# Patient Record
Sex: Female | Born: 1975
Health system: Southern US, Community
[De-identification: ages and names within clinical notes are randomized; demographics above are authoritative.]

## PROBLEM LIST (undated history)

## (undated) ENCOUNTER — Emergency Department (HOSPITAL_BASED_OUTPATIENT_CLINIC_OR_DEPARTMENT_OTHER): Admission: EM

## (undated) DIAGNOSIS — F5104 Psychophysiologic insomnia: Secondary | ICD-10-CM

## (undated) DIAGNOSIS — G4489 Other headache syndrome: Secondary | ICD-10-CM

## (undated) DIAGNOSIS — I10 Essential (primary) hypertension: Secondary | ICD-10-CM

## (undated) DIAGNOSIS — IMO0002 Reserved for concepts with insufficient information to code with codable children: Secondary | ICD-10-CM

## (undated) DIAGNOSIS — Z3041 Encounter for surveillance of contraceptive pills: Secondary | ICD-10-CM

## (undated) DIAGNOSIS — M47816 Spondylosis without myelopathy or radiculopathy, lumbar region: Secondary | ICD-10-CM

## (undated) DIAGNOSIS — A6000 Herpesviral infection of urogenital system, unspecified: Secondary | ICD-10-CM

## (undated) DIAGNOSIS — N926 Irregular menstruation, unspecified: Secondary | ICD-10-CM

## (undated) DIAGNOSIS — E669 Obesity, unspecified: Secondary | ICD-10-CM

## (undated) DIAGNOSIS — K219 Gastro-esophageal reflux disease without esophagitis: Secondary | ICD-10-CM

## (undated) DIAGNOSIS — M792 Neuralgia and neuritis, unspecified: Secondary | ICD-10-CM

## (undated) HISTORY — DX: Essential (primary) hypertension: I10

## (undated) HISTORY — DX: Encounter for surveillance of contraceptive pills: Z30.41

## (undated) HISTORY — DX: Gastro-esophageal reflux disease without esophagitis: K21.9

## (undated) HISTORY — DX: Neuralgia and neuritis, unspecified: M79.2

## (undated) HISTORY — DX: Spondylosis without myelopathy or radiculopathy, lumbar region: M47.816

## (undated) HISTORY — DX: Psychophysiologic insomnia: F51.04

## (undated) HISTORY — DX: Herpesviral infection of urogenital system, unspecified: A60.00

## (undated) HISTORY — DX: Irregular menstruation, unspecified: N92.6

## (undated) HISTORY — DX: Other headache syndrome: G44.89

## (undated) HISTORY — PX: REDUCTION MAMMAPLASTY: SUR839

## (undated) HISTORY — DX: Reserved for concepts with insufficient information to code with codable children: IMO0002

## (undated) HISTORY — DX: Obesity, unspecified: E66.9

## (undated) HISTORY — PX: BREAST REDUCTION SURGERY: SHX8

---

## 1998-05-11 ENCOUNTER — Emergency Department (HOSPITAL_COMMUNITY): Admission: EM | Admit: 1998-05-11 | Discharge: 1998-05-11 | Payer: Self-pay | Admitting: *Deleted

## 1999-09-29 ENCOUNTER — Emergency Department (HOSPITAL_COMMUNITY): Admission: EM | Admit: 1999-09-29 | Discharge: 1999-09-29 | Payer: Self-pay | Admitting: *Deleted

## 1999-10-02 ENCOUNTER — Emergency Department (HOSPITAL_COMMUNITY): Admission: EM | Admit: 1999-10-02 | Discharge: 1999-10-02 | Payer: Self-pay | Admitting: Emergency Medicine

## 1999-10-03 ENCOUNTER — Emergency Department (HOSPITAL_COMMUNITY): Admission: EM | Admit: 1999-10-03 | Discharge: 1999-10-03 | Payer: Self-pay | Admitting: Emergency Medicine

## 2000-01-05 ENCOUNTER — Other Ambulatory Visit: Admission: RE | Admit: 2000-01-05 | Discharge: 2000-01-05 | Payer: Self-pay | Admitting: Family Medicine

## 2000-10-11 ENCOUNTER — Emergency Department (HOSPITAL_COMMUNITY): Admission: EM | Admit: 2000-10-11 | Discharge: 2000-10-11 | Payer: Self-pay | Admitting: Emergency Medicine

## 2000-11-20 ENCOUNTER — Inpatient Hospital Stay (HOSPITAL_COMMUNITY): Admission: AD | Admit: 2000-11-20 | Discharge: 2000-11-20 | Payer: Self-pay | Admitting: *Deleted

## 2000-12-26 ENCOUNTER — Other Ambulatory Visit: Admission: RE | Admit: 2000-12-26 | Discharge: 2000-12-26 | Payer: Self-pay | Admitting: Obstetrics and Gynecology

## 2001-07-21 ENCOUNTER — Inpatient Hospital Stay (HOSPITAL_COMMUNITY): Admission: AD | Admit: 2001-07-21 | Discharge: 2001-07-21 | Payer: Self-pay | Admitting: *Deleted

## 2001-07-21 ENCOUNTER — Inpatient Hospital Stay (HOSPITAL_COMMUNITY): Admission: AD | Admit: 2001-07-21 | Discharge: 2001-07-25 | Payer: Self-pay | Admitting: Obstetrics and Gynecology

## 2002-12-31 ENCOUNTER — Other Ambulatory Visit: Admission: RE | Admit: 2002-12-31 | Discharge: 2002-12-31 | Payer: Self-pay | Admitting: Obstetrics and Gynecology

## 2003-02-21 HISTORY — PX: BREAST REDUCTION SURGERY: SHX8

## 2003-07-04 ENCOUNTER — Emergency Department (HOSPITAL_COMMUNITY): Admission: EM | Admit: 2003-07-04 | Discharge: 2003-07-04 | Payer: Self-pay | Admitting: Emergency Medicine

## 2003-08-11 ENCOUNTER — Emergency Department (HOSPITAL_COMMUNITY): Admission: EM | Admit: 2003-08-11 | Discharge: 2003-08-11 | Payer: Self-pay | Admitting: *Deleted

## 2003-10-06 ENCOUNTER — Emergency Department (HOSPITAL_COMMUNITY): Admission: EM | Admit: 2003-10-06 | Discharge: 2003-10-06 | Payer: Self-pay | Admitting: Emergency Medicine

## 2004-01-12 ENCOUNTER — Emergency Department (HOSPITAL_COMMUNITY): Admission: EM | Admit: 2004-01-12 | Discharge: 2004-01-12 | Payer: Self-pay | Admitting: Family Medicine

## 2004-05-12 ENCOUNTER — Ambulatory Visit: Payer: Self-pay | Admitting: Internal Medicine

## 2004-09-24 ENCOUNTER — Emergency Department (HOSPITAL_COMMUNITY): Admission: EM | Admit: 2004-09-24 | Discharge: 2004-09-24 | Payer: Self-pay | Admitting: Emergency Medicine

## 2006-12-12 ENCOUNTER — Encounter: Admission: RE | Admit: 2006-12-12 | Discharge: 2006-12-12 | Payer: Self-pay | Admitting: *Deleted

## 2007-05-14 ENCOUNTER — Emergency Department (HOSPITAL_COMMUNITY): Admission: EM | Admit: 2007-05-14 | Discharge: 2007-05-14 | Payer: Self-pay | Admitting: Emergency Medicine

## 2008-05-24 ENCOUNTER — Emergency Department (HOSPITAL_COMMUNITY): Admission: EM | Admit: 2008-05-24 | Discharge: 2008-05-24 | Payer: Self-pay | Admitting: Family Medicine

## 2009-07-24 ENCOUNTER — Emergency Department (HOSPITAL_COMMUNITY): Admission: EM | Admit: 2009-07-24 | Discharge: 2009-07-24 | Payer: Self-pay | Admitting: Family Medicine

## 2010-04-29 ENCOUNTER — Ambulatory Visit (INDEPENDENT_AMBULATORY_CARE_PROVIDER_SITE_OTHER): Payer: 59 | Admitting: Family Medicine

## 2010-04-29 DIAGNOSIS — Z0000 Encounter for general adult medical examination without abnormal findings: Secondary | ICD-10-CM

## 2010-05-16 ENCOUNTER — Inpatient Hospital Stay (INDEPENDENT_AMBULATORY_CARE_PROVIDER_SITE_OTHER)
Admission: RE | Admit: 2010-05-16 | Discharge: 2010-05-16 | Disposition: A | Discharge status: Home / Self Care | Payer: 59 | Source: Ambulatory Visit | Attending: Family Medicine | Admitting: Family Medicine

## 2010-05-16 DIAGNOSIS — H5710 Ocular pain, unspecified eye: Secondary | ICD-10-CM

## 2010-07-08 NOTE — Discharge Summary (Signed)
[facility]  Patient:    Amanda Cox, Amanda Cox Visit Number: [id number] MRN: [id number]          Service Type: OBS Location: 910A [id number] Attending Physician:  Miguel Aschoff Dictated by:   Leilani Able, P.A. Admit Date:  07/21/2001 Discharge Date: 07/25/2001                             Discharge Summary  FINAL DIAGNOSES:                  1. Intrauterine pregnancy at term.                                   2. Arrest disorder of dilation.                                   3. Intrapartum fever.  PROCEDURE:                        Primary low transverse cesarean section.  SURGEON:                          Gerrit Friends. Aldona Bar, M.D.  COMPLICATIONS:                    None.  HISTORY AND HOSPITAL COURSE:      This 35 year old G2, P0, presents on July 21, 2001 in early labor.  The patient was about 3 cm dilated, 90% effaced, and -2 station at this time with intact membranes.  When the patient was checked on the morning of July 22, 2001, she was about 5 cm dilated, 60% effaced, and still about -2 to -3 station.  The vertex did not fit the cervix well.  There was also a possibility of the estimated fetal weight being around 9 pounds and at this point amniotomy was carried with clear fluids and IUPCs were placed.  Pitocin was started for augmentation.  The patient did receive an epidural.  The patient had about 3 hours of good documented labor and her cervix remained unchanged.  The patient was also now starting an intrapartum fever of about 100.6 and at this point a decision was made to proceed with a cesarean section.  Secondary to arrest disorder of dilation and intrapartum fever, the patient was taken to the operating room on July 22, 2001 by Dr. Annamaria Helling where a primary low transverse cesarean section was performed with the delivery of a 7-pound 7-ounce female infant with Apgars of 8 and 9. The delivery went without complication.  The patients postoperative  course was benign without significant fevers.  The patient was felt ready for discharge on postoperative day #3.  She was sent home on a regular diet, told to decrease activities, told to continue prenatal vitamins and FeSO4, was given a prescription for Percocet one to two every 4 hours as needed for pain, told to follow up in the office in 4 weeks. Dictated by:   Leilani Able, P.A. Attending Physician:  Miguel Aschoff DD:  08/15/01 TD:  08/17/01 Job: [id number]

## 2010-08-03 ENCOUNTER — Other Ambulatory Visit: Payer: Self-pay | Admitting: Obstetrics

## 2010-08-03 DIAGNOSIS — N946 Dysmenorrhea, unspecified: Secondary | ICD-10-CM

## 2010-08-04 ENCOUNTER — Ambulatory Visit (HOSPITAL_COMMUNITY)
Admission: RE | Admit: 2010-08-04 | Discharge: 2010-08-04 | Disposition: A | Discharge status: Home / Self Care | Payer: 59 | Source: Ambulatory Visit | Attending: Obstetrics | Admitting: Obstetrics

## 2010-08-04 ENCOUNTER — Ambulatory Visit (HOSPITAL_COMMUNITY): Payer: 59

## 2010-08-04 DIAGNOSIS — N946 Dysmenorrhea, unspecified: Secondary | ICD-10-CM

## 2010-08-04 DIAGNOSIS — D251 Intramural leiomyoma of uterus: Secondary | ICD-10-CM | POA: Insufficient documentation

## 2010-08-04 DIAGNOSIS — IMO0002 Reserved for concepts with insufficient information to code with codable children: Secondary | ICD-10-CM | POA: Insufficient documentation

## 2010-09-07 ENCOUNTER — Encounter: Payer: Self-pay | Admitting: Family Medicine

## 2010-09-12 ENCOUNTER — Telehealth: Payer: Self-pay | Admitting: Family Medicine

## 2010-09-12 NOTE — Telephone Encounter (Signed)
All results normal per JCL.   If she still is having issues then come in for check up... Left message for pt-lm

## 2010-09-12 NOTE — Telephone Encounter (Signed)
I have call [facility] for results

## 2010-09-15 ENCOUNTER — Telehealth: Payer: Self-pay | Admitting: Family Medicine

## 2010-09-15 NOTE — Telephone Encounter (Signed)
Need inform pt her results were are normal per [facility] & if she is still having issues, needs appt-lm

## 2010-09-16 ENCOUNTER — Telehealth: Payer: Self-pay | Admitting: Family Medicine

## 2010-09-16 NOTE — Telephone Encounter (Signed)
SEE ABOVE

## 2010-12-24 ENCOUNTER — Inpatient Hospital Stay (INDEPENDENT_AMBULATORY_CARE_PROVIDER_SITE_OTHER)
Admission: RE | Admit: 2010-12-24 | Discharge: 2010-12-24 | Disposition: A | Discharge status: Home / Self Care | Payer: 59 | Source: Ambulatory Visit | Attending: Family Medicine | Admitting: Family Medicine

## 2010-12-24 DIAGNOSIS — J310 Chronic rhinitis: Secondary | ICD-10-CM

## 2010-12-24 DIAGNOSIS — J019 Acute sinusitis, unspecified: Secondary | ICD-10-CM

## 2011-08-28 ENCOUNTER — Other Ambulatory Visit: Payer: Self-pay | Admitting: Obstetrics

## 2011-08-31 ENCOUNTER — Other Ambulatory Visit: Payer: Self-pay | Admitting: Obstetrics

## 2011-09-19 ENCOUNTER — Other Ambulatory Visit: Payer: Self-pay | Admitting: Obstetrics

## 2011-10-16 ENCOUNTER — Ambulatory Visit (INDEPENDENT_AMBULATORY_CARE_PROVIDER_SITE_OTHER): Payer: 59 | Admitting: Family Medicine

## 2011-10-16 ENCOUNTER — Encounter: Payer: Self-pay | Admitting: Family Medicine

## 2011-10-16 VITALS — BP 140/80 | HR 90 | Wt 187.0 lb

## 2011-10-16 DIAGNOSIS — IMO0001 Reserved for inherently not codable concepts without codable children: Secondary | ICD-10-CM

## 2011-10-16 DIAGNOSIS — R030 Elevated blood-pressure reading, without diagnosis of hypertension: Secondary | ICD-10-CM

## 2011-10-16 MED ORDER — HYDROCHLOROTHIAZIDE 12.5 MG PO TABS: 12.5000 mg | ORAL_TABLET | Freq: Every day | ORAL | Status: DC

## 2011-10-16 NOTE — Progress Notes (Signed)
  Subjective:    Patient ID: TENNILLE Cox, female    DOB: [birth year], 36 y.o.   MRN: [id number]  HPI She is here for consult concerning her blood pressure. Apparently she was diagnosed several years ago as being prehypertensive but placed on HCTZ. She has been off medication for approximately one month. She does check it at home and notes systolic blood pressures in the 130 range. In the past she did exercise regularly and use Weight Watchers however she gained 50 pounds back. She is interested in being placed back on her medication. She has started back into Weight Watchers.   Review of Systems     Objective:   Physical Exam Alert and in no distress. But pressure is recorded       Assessment & Plan:   1. Elevated blood pressure  hydrochlorothiazide (HYDRODIURIL) 12.5 MG tablet   After discussion with her, we have decided to wait till she loses at least 20 pounds. She will then stop her fluid pill and monitor her blood pressure over the next several months and let me know.

## 2011-10-16 NOTE — Patient Instructions (Signed)
Continue with Weight Watchers and Zumba. Keep working on the weight and when you get at least 20 pounds off then we can consider stopping the medication. If that's the case stop the medication and monitor your blood pressure over the next several months and let me know

## 2012-02-20 ENCOUNTER — Emergency Department (INDEPENDENT_AMBULATORY_CARE_PROVIDER_SITE_OTHER)
Admission: EM | Admit: 2012-02-20 | Discharge: 2012-02-20 | Disposition: A | Discharge status: Home / Self Care | Payer: 59 | Source: Home / Self Care | Attending: Family Medicine | Admitting: Family Medicine

## 2012-02-20 ENCOUNTER — Encounter (HOSPITAL_COMMUNITY): Payer: Self-pay | Admitting: *Deleted

## 2012-02-20 DIAGNOSIS — J069 Acute upper respiratory infection, unspecified: Secondary | ICD-10-CM

## 2012-02-20 MED ORDER — IPRATROPIUM BROMIDE 0.06 % NA SOLN: 2.0000 | Freq: Four times a day (QID) | NASAL | Status: DC

## 2012-02-20 NOTE — ED Provider Notes (Signed)
History     CSN: [id number]  Arrival date & time 02/20/12  1811   First MD Initiated Contact with Patient 02/20/12 1834      Chief Complaint  Patient presents with  . Sore Throat    (Consider location/radiation/quality/duration/timing/severity/associated sxs/prior treatment) Patient is a 36 y.o. female presenting with pharyngitis. The history is provided by the patient.  Sore Throat This is a new problem. The current episode started more than 1 week ago (3-4 week h/o uri sx, couldn't get time off from work to be seen by lmd.). The problem has not changed since onset.The symptoms are aggravated by swallowing.    History reviewed. No pertinent past medical history.  Past Surgical History  Procedure Date  . Breast reduction surgery     Family History  Problem Relation Age of Onset  . Diabetes Mother   . Hyperlipidemia Mother   . Arthritis Mother   . Diabetes Sister   . Diabetes Maternal Grandmother   . Arthritis Paternal Grandmother     History  Substance Use Topics  . Smoking status: Never Smoker   . Smokeless tobacco: Never Used  . Alcohol Use: Not on file    OB History    Grav Para Term Preterm Abortions TAB SAB Ect Mult Living                  Review of Systems  Constitutional: Negative.   HENT: Positive for congestion, sore throat and rhinorrhea. Negative for trouble swallowing.   Respiratory: Negative.   Cardiovascular: Negative.   Gastrointestinal: Negative.     Allergies  Sulfa antibiotics  Home Medications   Current Outpatient Rx  Name  Route  Sig  Dispense  Refill  . HYDROCHLOROTHIAZIDE 12.5 MG PO TABS   Oral   Take 1 tablet (12.5 mg total) by mouth daily.   90 tablet   3   . IPRATROPIUM BROMIDE 0.06 % NA SOLN   Nasal   Place 2 sprays into the nose 4 (four) times daily.   15 mL   1   . NORGESTIM-ETH ESTRAD TRIPHASIC 0.18/0.215/0.25 MG-25 MCG PO TABS   Oral   Take 1 tablet by mouth daily.           Marland Kitchen VALACYCLOVIR HCL 500 MG PO  TABS   Oral   Take 500 mg by mouth daily.             BP 140/91  Pulse 83  Temp 98.9 F (37.2 C) (Oral)  Resp 16  SpO2 100%  LMP 02/15/2012  Physical Exam  Nursing note and vitals reviewed. Constitutional: She is oriented to person, place, and time. She appears well-developed and well-nourished.  HENT:  Head: Normocephalic.  Right Ear: External ear normal.  Left Ear: External ear normal.  Mouth/Throat: Oropharynx is clear and moist.  Eyes: Pupils are equal, round, and reactive to light.  Neck: Normal range of motion. Neck supple.  Cardiovascular: Normal rate, regular rhythm, normal heart sounds and intact distal pulses.   Pulmonary/Chest: Effort normal and breath sounds normal.  Lymphadenopathy:    She has no cervical adenopathy.  Neurological: She is alert and oriented to person, place, and time.  Skin: Skin is warm and dry.    ED Course  Procedures (including critical care time)  Labs Reviewed - No data to display No results found.   1. URI (upper respiratory infection)       MDM          Fayrene Fearing  Sallyanne Kuster, MD 02/20/12 [phone]

## 2012-02-20 NOTE — ED Notes (Signed)
Pt  Reports  Symptoms  Of  sorethroat     With  Blood  In  Her  Mucous      Yellowish  Cough   And  Congestion      Symptoms  X  3-4  Weeks    Pt  Masked  And  Is  In  A  Private  Room

## 2012-03-29 ENCOUNTER — Other Ambulatory Visit (HOSPITAL_COMMUNITY)
Admission: RE | Admit: 2012-03-29 | Discharge: 2012-03-29 | Disposition: A | Discharge status: Home / Self Care | Payer: 59 | Source: Ambulatory Visit | Attending: Emergency Medicine | Admitting: Emergency Medicine

## 2012-03-29 ENCOUNTER — Emergency Department (HOSPITAL_COMMUNITY)
Admission: EM | Admit: 2012-03-29 | Discharge: 2012-03-29 | Disposition: A | Discharge status: Home / Self Care | Payer: 59 | Source: Home / Self Care | Attending: Emergency Medicine | Admitting: Emergency Medicine

## 2012-03-29 ENCOUNTER — Encounter (HOSPITAL_COMMUNITY): Payer: Self-pay

## 2012-03-29 DIAGNOSIS — N760 Acute vaginitis: Secondary | ICD-10-CM

## 2012-03-29 DIAGNOSIS — Z3480 Encounter for supervision of other normal pregnancy, unspecified trimester: Secondary | ICD-10-CM

## 2012-03-29 DIAGNOSIS — Z113 Encounter for screening for infections with a predominantly sexual mode of transmission: Secondary | ICD-10-CM | POA: Insufficient documentation

## 2012-03-29 DIAGNOSIS — B9689 Other specified bacterial agents as the cause of diseases classified elsewhere: Secondary | ICD-10-CM

## 2012-03-29 DIAGNOSIS — A499 Bacterial infection, unspecified: Secondary | ICD-10-CM

## 2012-03-29 LAB — POCT URINALYSIS DIP (DEVICE)
Bilirubin Urine: NEGATIVE
Glucose, UA: NEGATIVE mg/dL

## 2012-03-29 MED ORDER — METRONIDAZOLE 0.75 % VA GEL: 1.0000 | Freq: Two times a day (BID) | VAGINAL | Status: DC

## 2012-03-29 MED ORDER — GNP PRENATAL VITAMINS 28-0.8 MG PO TABS: ORAL_TABLET | ORAL | Status: DC

## 2012-03-29 NOTE — ED Provider Notes (Signed)
Chief Complaint  Patient presents with  . Vaginal Itching    History of Present Illness:   Amanda Cox is a 37 year old female who presents today with a two-day history of vaginal odor and slight discharge. She denies any itching or irritation. There is no vulvar, vaginal, or pelvic pain. She denies any lower back pain, fever, chills, nausea, or vomiting. She's had no urinary symptoms. The patient had a normal menstrual period about 2 weeks ago. She is sexually active with consistent use of birth control pills. She is allergic to sulfa and has had high blood pressure in the past and takes hydrochlorothiazide. She denies any pregnancy symptoms such as morning sickness, breast swelling or tenderness.  Review of Systems:  Other than noted above, the patient denies any of the following symptoms: Systemic:  No fever, chills, sweats, fatigue, or weight loss. GI:  No abdominal pain, nausea, anorexia, vomiting, diarrhea, constipation, melena or hematochezia. GU:  No dysuria, frequency, urgency, hematuria, vaginal discharge, itching, or abnormal vaginal bleeding. Skin:  No rash or itching.  PMFSH:  Past medical history, family history, social history, meds, and allergies were reviewed.  Physical Exam:   Vital signs:  BP 145/94  Pulse 88  Temp 99.8 F (37.7 C) (Oral)  Resp 18  SpO2 100%  LMP 03/15/2012 General:  Alert, oriented and in no distress. Lungs:  Breath sounds clear and equal bilaterally.  No wheezes, rales or rhonchi. Heart:  Regular rhythm.  No gallops or murmers. Abdomen:  Soft, flat and non-distended.  No organomegaly or mass.  No tenderness, guarding or rebound.  Bowel sounds normally active. Pelvic exam:  Normal external genitalia. She has a copious, white, homogeneous discharge which was non-malodorous. Cervix appears normal. No cervical motion tenderness. Uterus is normal in size and shape and nontender. No adnexal tenderness or masses. Skin:  Clear, warm and dry.  Labs:    Results for orders placed during the hospital encounter of 03/29/12  POCT URINALYSIS DIP (DEVICE)      Component Value Range   Glucose, UA NEGATIVE  NEGATIVE mg/dL   Bilirubin Urine NEGATIVE  NEGATIVE   Ketones, ur 15 (*) NEGATIVE mg/dL   Specific Gravity, Urine 1.025  1.005 - 1.030   Hgb urine dipstick TRACE (*) NEGATIVE   pH 6.0  5.0 - 8.0   Protein, ur 100 (*) NEGATIVE mg/dL   Urobilinogen, UA 0.2  0.0 - 1.0 mg/dL   Nitrite NEGATIVE  NEGATIVE   Leukocytes, UA NEGATIVE  NEGATIVE  POCT PREGNANCY, URINE      Component Value Range   Preg Test, Ur POSITIVE (*) NEGATIVE    Other Labs Obtained at Urgent Care Center:  DNA probe for gonorrhea, Chlamydia, Trichomonas, Gardnerella, Candida were obtained.  Results are pending at this time and we will call about any positive results.  Assessment:  The primary encounter diagnosis was Bacterial vaginosis. A diagnosis of Pregnancy, subsequent was also pertinent to this visit.  The patient was informed of the positive pregnancy test and this was a surprise to her, since she had been taking birth control pills and her frequency of intercourse was very infrequent. I advised her to followup with her gynecologist, Dr. Clearance Coots in the near future in the meantime to leave off her hydrochlorothiazide. There is no contraindication to metronidazole with pregnancy, so she was given a prescription for this.  Plan:   1.  The following meds were prescribed:   New Prescriptions   METRONIDAZOLE (METROGEL) 0.75 % VAGINAL GEL  Place 1 Applicatorful vaginally 2 (two) times daily.   PRENATAL VIT-FE FUMARATE-FA (GNP PRENATAL VITAMINS) 28-0.8 MG TABS    Take 1 daily.   2.  The patient was instructed in symptomatic care and handouts were given. 3.  The patient was told to return if becoming worse in any way, if no better in 3 or 4 days, and given some red flag symptoms that would indicate earlier return.  Follow up:  The patient was told to follow up with Dr. Coral Ceo within the next 2 weeks.     Reuben Likes, MD 03/29/12 2005

## 2012-03-29 NOTE — ED Notes (Signed)
C/o vaginal odor, d/c

## 2012-04-02 NOTE — ED Notes (Signed)
GC/Chlamydia neg., Affirm: Candida and Trich neg., Gardnerella pos.  Pt. adequately treated with Metrogel. Vassie Moselle 04/02/2012

## 2012-04-03 NOTE — ED Notes (Signed)
Patient called to ask about her lab reports ; after verifying ID, discussed negative findings

## 2012-05-16 ENCOUNTER — Encounter: Payer: Self-pay | Admitting: *Deleted

## 2012-05-21 ENCOUNTER — Ambulatory Visit: Payer: Self-pay | Admitting: Obstetrics

## 2012-06-26 ENCOUNTER — Ambulatory Visit: Payer: Self-pay | Admitting: Obstetrics

## 2012-07-17 ENCOUNTER — Ambulatory Visit (INDEPENDENT_AMBULATORY_CARE_PROVIDER_SITE_OTHER): Payer: 59 | Admitting: Obstetrics

## 2012-07-17 ENCOUNTER — Encounter: Payer: Self-pay | Admitting: Obstetrics

## 2012-07-17 VITALS — BP 129/87 | HR 87 | Temp 98.2°F | Ht 63.0 in | Wt 193.2 lb

## 2012-07-17 DIAGNOSIS — A499 Bacterial infection, unspecified: Secondary | ICD-10-CM

## 2012-07-17 DIAGNOSIS — B9689 Other specified bacterial agents as the cause of diseases classified elsewhere: Secondary | ICD-10-CM

## 2012-07-17 DIAGNOSIS — N760 Acute vaginitis: Secondary | ICD-10-CM

## 2012-07-17 DIAGNOSIS — A6000 Herpesviral infection of urogenital system, unspecified: Secondary | ICD-10-CM

## 2012-07-17 DIAGNOSIS — Z3041 Encounter for surveillance of contraceptive pills: Secondary | ICD-10-CM

## 2012-07-17 DIAGNOSIS — Z01419 Encounter for gynecological examination (general) (routine) without abnormal findings: Secondary | ICD-10-CM

## 2012-07-17 MED ORDER — VALACYCLOVIR HCL 500 MG PO TABS: 500.0000 mg | ORAL_TABLET | Freq: Every day | ORAL | Status: DC

## 2012-07-17 MED ORDER — NORGESTIM-ETH ESTRAD TRIPHASIC 0.18/0.215/0.25 MG-25 MCG PO TABS: 1.0000 | ORAL_TABLET | Freq: Every day | ORAL | Status: DC

## 2012-07-17 MED ORDER — FLUCONAZOLE 150 MG PO TABS: 150.0000 mg | ORAL_TABLET | Freq: Once | ORAL | Status: DC

## 2012-07-17 MED ORDER — METRONIDAZOLE 0.75 % VA GEL: 1.0000 | Freq: Two times a day (BID) | VAGINAL | Status: DC

## 2012-07-17 NOTE — Progress Notes (Signed)
.   Subjective:     Amanda Cox is a 37 y.o. female here for a routine exam. No cCurrent complaints.  Personal health questionnaire reviewed: yes.   Gynecologic History Patient's last menstrual period was 07/04/2012. Contraception: OCP (estrogen/progesterone) Last Pap: 05/30/2011. Results were: normal Last mammogram: N/A  Obstetric History OB History   Grav Para Term Preterm Abortions TAB SAB Ect Mult Living                   The following portions of the patient's history were reviewed and updated as appropriate: allergies, current medications, past family history, past medical history, past social history, past surgical history and problem list.  Review of Systems Pertinent items are noted in HPI.    Objective:    General appearance: alert and no distress    Pelvic:          NEFG          Vulva, vagina and cervix normal          Uterus NSSC, NT          Adnexa without masses or tenderness Assessment:    Healthy female exam.    Plan:    Education reviewed: BV.    F/U 1 year.

## 2012-07-18 LAB — GC/CHLAMYDIA PROBE AMP
CT Probe RNA: NEGATIVE
GC Probe RNA: NEGATIVE

## 2012-07-18 LAB — PAP IG W/ RFLX HPV ASCU

## 2012-07-18 LAB — WET PREP BY MOLECULAR PROBE
Candida species: NEGATIVE
Trichomonas vaginosis: NEGATIVE

## 2012-07-19 ENCOUNTER — Other Ambulatory Visit: Payer: Self-pay | Admitting: Obstetrics

## 2012-07-25 ENCOUNTER — Telehealth: Payer: Self-pay | Admitting: *Deleted

## 2012-07-25 NOTE — Telephone Encounter (Signed)
Patient called regarding UTI symptoms.  Cipro 500mg  1 PO BID #14 called to her pharmacy per DR. Clearance Coots.

## 2012-07-26 ENCOUNTER — Encounter: Payer: Self-pay | Admitting: Obstetrics

## 2012-09-16 ENCOUNTER — Other Ambulatory Visit: Payer: Self-pay | Admitting: Obstetrics

## 2012-09-16 DIAGNOSIS — A6009 Herpesviral infection of other urogenital tract: Secondary | ICD-10-CM

## 2012-09-16 MED ORDER — LIDOCAINE 5 % EX OINT: TOPICAL_OINTMENT | Freq: Two times a day (BID) | CUTANEOUS | Status: DC

## 2012-10-04 ENCOUNTER — Encounter: Payer: Self-pay | Admitting: Obstetrics

## 2013-01-17 ENCOUNTER — Ambulatory Visit (INDEPENDENT_AMBULATORY_CARE_PROVIDER_SITE_OTHER): Payer: 59 | Admitting: Physician Assistant

## 2013-01-17 VITALS — BP 136/78 | HR 100 | Temp 98.0°F | Resp 17 | Ht 65.5 in | Wt 213.0 lb

## 2013-01-17 DIAGNOSIS — IMO0001 Reserved for inherently not codable concepts without codable children: Secondary | ICD-10-CM

## 2013-01-17 DIAGNOSIS — R030 Elevated blood-pressure reading, without diagnosis of hypertension: Secondary | ICD-10-CM

## 2013-01-17 LAB — BASIC METABOLIC PANEL
Calcium: 9.2 mg/dL (ref 8.4–10.5)
Sodium: 140 mEq/L (ref 135–145)

## 2013-01-17 MED ORDER — HYDROCHLOROTHIAZIDE 12.5 MG PO TABS: 12.5000 mg | ORAL_TABLET | Freq: Every day | ORAL | Status: DC

## 2013-01-17 NOTE — Patient Instructions (Signed)
Check your BP and if it stays consistently higher than 140/90 restart the HCTZ.

## 2013-01-17 NOTE — Progress Notes (Signed)
   Subjective:    Patient ID: Amanda Cox, female    DOB: 06/20/1975, 37 y.o.   MRN: [id number]  HPI  Pt presents to clinic for a restart of her BP medications.  She was started on HCTZ about 18 months ago with slightly elevated BP and daily HAs.  When she was on the BP med her headaches resolved and her BP consistently ran 120/80s.  She has been out of the BP meds for about 6 months and she has noticed over the last couple of weeks those HAs are back and her BP is running higher than normal and she has noticed that she has gained some weight.  She has family history of HTN.    Review of Systems  Respiratory: Negative for shortness of breath.   Cardiovascular: Negative for chest pain.       Objective:   Physical Exam  Vitals reviewed. Constitutional: She appears well-developed and well-nourished.  HENT:  Head: Normocephalic and atraumatic.  Right Ear: External ear normal.  Left Ear: External ear normal.  Eyes: Conjunctivae are normal.  Neck: Normal range of motion.  Cardiovascular: Normal rate, regular rhythm and normal heart sounds.   No murmur heard. Pulmonary/Chest: Effort normal and breath sounds normal.  Skin: Skin is warm and dry.  Psychiatric: She has a normal mood and affect. Her behavior is normal. Judgment and thought content normal.       Assessment & Plan:  Elevated BP - Plan: Basic metabolic panel  Elevated blood pressure - Plan: hydrochlorothiazide (HYDRODIURIL) 12.5 MG tablet  D/w pt that her BP at today's reading is ok but if she is having headaches I will send her home with a refill of her meds with the instruction to monitor her salt intake (she puts it on all her foods and eats processed foods regularly) and see if she has lose some weight and that may be all she needs to do to bring her BP to a reading where she does not get daily headaches.  If she restarts on the medications and her HAs continue she should RTC.  Benny Lennert PA-C 01/17/2013 1:19  PM

## 2013-05-04 ENCOUNTER — Ambulatory Visit (INDEPENDENT_AMBULATORY_CARE_PROVIDER_SITE_OTHER): Payer: 59 | Admitting: Family Medicine

## 2013-05-04 VITALS — BP 138/76 | HR 75 | Temp 98.5°F | Resp 16 | Ht 63.75 in | Wt 198.4 lb

## 2013-05-04 DIAGNOSIS — T23029A Burn of unspecified degree of unspecified single finger (nail) except thumb, initial encounter: Secondary | ICD-10-CM

## 2013-05-04 MED ORDER — MUPIROCIN CALCIUM 2 % EX CREA: 1.0000 "application " | TOPICAL_CREAM | Freq: Two times a day (BID) | CUTANEOUS | Status: DC

## 2013-05-04 NOTE — Patient Instructions (Signed)
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
## 2013-07-10 ENCOUNTER — Encounter: Payer: Self-pay | Admitting: Obstetrics

## 2013-07-10 ENCOUNTER — Ambulatory Visit: Payer: 59 | Admitting: Obstetrics

## 2013-07-10 ENCOUNTER — Ambulatory Visit (INDEPENDENT_AMBULATORY_CARE_PROVIDER_SITE_OTHER): Payer: 59 | Admitting: Obstetrics

## 2013-07-10 VITALS — BP 143/88 | HR 96 | Temp 97.9°F | Ht 63.0 in | Wt 194.0 lb

## 2013-07-10 DIAGNOSIS — B373 Candidiasis of vulva and vagina: Secondary | ICD-10-CM

## 2013-07-10 DIAGNOSIS — N760 Acute vaginitis: Secondary | ICD-10-CM

## 2013-07-10 DIAGNOSIS — I10 Essential (primary) hypertension: Secondary | ICD-10-CM | POA: Insufficient documentation

## 2013-07-10 DIAGNOSIS — B3731 Acute candidiasis of vulva and vagina: Secondary | ICD-10-CM | POA: Insufficient documentation

## 2013-07-10 DIAGNOSIS — Z01419 Encounter for gynecological examination (general) (routine) without abnormal findings: Secondary | ICD-10-CM

## 2013-07-10 DIAGNOSIS — Z3041 Encounter for surveillance of contraceptive pills: Secondary | ICD-10-CM

## 2013-07-10 DIAGNOSIS — B9689 Other specified bacterial agents as the cause of diseases classified elsewhere: Secondary | ICD-10-CM

## 2013-07-10 DIAGNOSIS — N390 Urinary tract infection, site not specified: Secondary | ICD-10-CM

## 2013-07-10 DIAGNOSIS — Z113 Encounter for screening for infections with a predominantly sexual mode of transmission: Secondary | ICD-10-CM | POA: Insufficient documentation

## 2013-07-10 DIAGNOSIS — A6000 Herpesviral infection of urogenital system, unspecified: Secondary | ICD-10-CM

## 2013-07-10 DIAGNOSIS — Z0000 Encounter for general adult medical examination without abnormal findings: Secondary | ICD-10-CM

## 2013-07-10 MED ORDER — CEPHALEXIN 500 MG PO CAPS: 500.0000 mg | ORAL_CAPSULE | Freq: Two times a day (BID) | ORAL | Status: DC

## 2013-07-10 MED ORDER — TRIAMTERENE-HCTZ 50-25 MG PO CAPS: 1.0000 | ORAL_CAPSULE | Freq: Every day | ORAL | Status: DC

## 2013-07-10 MED ORDER — METRONIDAZOLE 0.75 % VA GEL: 1.0000 | Freq: Two times a day (BID) | VAGINAL | Status: DC

## 2013-07-10 MED ORDER — FLUCONAZOLE 150 MG PO TABS: 150.0000 mg | ORAL_TABLET | Freq: Once | ORAL | Status: DC

## 2013-07-10 MED ORDER — NORGESTIM-ETH ESTRAD TRIPHASIC 0.18/0.215/0.25 MG-25 MCG PO TABS: 1.0000 | ORAL_TABLET | Freq: Every day | ORAL | Status: DC

## 2013-07-10 MED ORDER — VALACYCLOVIR HCL 500 MG PO TABS: 500.0000 mg | ORAL_TABLET | Freq: Every day | ORAL | Status: DC

## 2013-07-10 MED ORDER — TRIAMTERENE-HCTZ 37.5-25 MG PO CAPS: 1.0000 | ORAL_CAPSULE | Freq: Every day | ORAL | Status: DC

## 2013-07-11 ENCOUNTER — Encounter: Payer: Self-pay | Admitting: Obstetrics

## 2013-07-11 LAB — GC/CHLAMYDIA PROBE AMP
CT Probe RNA: NEGATIVE
GC Probe RNA: NEGATIVE

## 2013-07-11 LAB — RPR

## 2013-07-11 LAB — PAP IG W/ RFLX HPV ASCU

## 2013-07-11 LAB — HEPATITIS B SURFACE ANTIGEN: HEP B S AG: NEGATIVE

## 2013-07-11 LAB — WET PREP BY MOLECULAR PROBE
CANDIDA SPECIES: NEGATIVE
GARDNERELLA VAGINALIS: NEGATIVE
Trichomonas vaginosis: NEGATIVE

## 2013-07-11 LAB — HIV ANTIBODY (ROUTINE TESTING W REFLEX): HIV 1&2 Ab, 4th Generation: NONREACTIVE

## 2013-07-11 LAB — HEPATITIS C ANTIBODY: HCV Ab: NEGATIVE

## 2013-07-11 NOTE — Progress Notes (Signed)
Subjective:     Amanda Cox is a 38 y.o. female here for a routine exam.  Current complaints: none.    Personal health questionnaire:  Is patient [name], have a family history of breast and/or ovarian cancer: no Is there a family history of uterine cancer diagnosed at age < 72, gastrointestinal cancer, urinary tract cancer, family member who is a Field seismologist syndrome-associated carrier: no Is the patient overweight and hypertensive, family history of diabetes, personal history of gestational diabetes or PCOS: no Is patient over 68, have PCOS,  family history of premature CHD under age 64, diabetes, smoke, have hypertension or peripheral artery disease:  no  The HPI was reviewed and explored in further detail by the provider. Gynecologic History Patient's last menstrual period was 06/26/2013. Contraception: OCP (estrogen/progesterone) Last Pap: 2014. Results were: normal Last mammogram: n/a. Results were: n/a  Obstetric History OB History  Gravida Para Term Preterm AB SAB TAB Ectopic Multiple Living  2 1 1  1  1   1     # Outcome Date GA Lbr Len/2nd Weight Sex Delivery Anes PTL Lv  2 TRM 07/22/01 [redacted]w[redacted]d  7 lb (3.175 kg) F LTCS EPI N Y  1 TAB               Past Medical History  Diagnosis Date  . Irregular menstrual cycle   . Genital herpes, unspecified   . Dyspareunia   . Essential hypertension, benign   . Surveillance of previously prescribed contraceptive pill     Past Surgical History  Procedure Laterality Date  . Breast reduction surgery    . Cesarean section      Current outpatient prescriptions:lidocaine (XYLOCAINE) 5 % ointment, Apply topically 2 (two) times daily., Disp: 35.44 g, Rfl: 5;  Norgestimate-Ethinyl Estradiol Triphasic (ORTHO TRI-CYCLEN LO) 0.18/0.215/0.25 MG-25 MCG tab, Take 1 tablet by mouth daily., Disp: 1 Package, Rfl: 11;  valACYclovir (VALTREX) 500 MG tablet, Take 1 tablet (500 mg total) by mouth daily., Disp: 30 tablet, Rfl: 11 cephALEXin  (KEFLEX) 500 MG capsule, Take 1 capsule (500 mg total) by mouth 2 (two) times daily., Disp: 14 capsule, Rfl: 2;  fluconazole (DIFLUCAN) 150 MG tablet, Take 1 tablet (150 mg total) by mouth once., Disp: 1 tablet, Rfl: 2;  metroNIDAZOLE (METROGEL) 0.75 % vaginal gel, Place 1 Applicatorful vaginally 2 (two) times daily., Disp: 70 g, Rfl: 2 triamterene-hydrochlorothiazide (DYAZIDE) 50-25 MG per capsule, Take 1 capsule by mouth daily., Disp: 30 capsule, Rfl: 11 Allergies  Allergen Reactions  . Sulfa Antibiotics Shortness Of Breath    History  Substance Use Topics  . Smoking status: Never Smoker   . Smokeless tobacco: Never Used  . Alcohol Use: No    Family History  Problem Relation Age of Onset  . Diabetes Mother   . Hyperlipidemia Mother   . Arthritis Mother   . Diabetes Sister   . Diabetes Maternal Grandmother   . Arthritis Paternal Grandmother   . Congenital heart disease        Review of Systems  Constitutional: negative for fatigue and weight loss Respiratory: negative for cough and wheezing Cardiovascular: negative for chest pain, fatigue and palpitations Gastrointestinal: negative for abdominal pain and change in bowel habits Musculoskeletal:negative for myalgias Neurological: negative for gait problems and tremors Behavioral/Psych: negative for abusive relationship, depression Endocrine: negative for temperature intolerance   Genitourinary:negative for abnormal menstrual periods, genital lesions, hot flashes, sexual problems and vaginal discharge Integument/breast: negative for breast lump, breast tenderness, nipple discharge and  skin lesion(s)    Objective:       BP 143/88  Pulse 96  Temp(Src) 97.9 F (36.6 C)  Ht 5\' 3"  (1.6 m)  Wt 194 lb (87.998 kg)  BMI 34.37 kg/m2  LMP 06/26/2013 General:   alert  Skin:   no rash or abnormalities  Lungs:   clear to auscultation bilaterally  Heart:   regular rate and rhythm, S1, S2 normal, no murmur, click, rub or gallop   Breasts:   normal without suspicious masses, skin or nipple changes or axillary nodes  Abdomen:  normal findings: no organomegaly, soft, non-tender and no hernia  Pelvis:  External genitalia: normal general appearance Urinary system: urethral meatus normal and bladder without fullness, nontender Vaginal: normal without tenderness, induration or masses Cervix: normal appearance Adnexa: normal bimanual exam Uterus: anteverted and non-tender, normal size   Lab Review Urine pregnancy test Labs reviewed yes Radiologic studies reviewed no    Assessment:    Healthy female exam.    Plan:    Education reviewed: low fat, low cholesterol diet, safe sex/STD prevention and self breast exams. Contraception: OCP (estrogen/progesterone). Follow up in: 1 year.   Meds ordered this encounter  Medications  . Norgestimate-Ethinyl Estradiol Triphasic (ORTHO TRI-CYCLEN LO) 0.18/0.215/0.25 MG-25 MCG tab    Sig: Take 1 tablet by mouth daily.    Dispense:  1 Package    Refill:  11  . valACYclovir (VALTREX) 500 MG tablet    Sig: Take 1 tablet (500 mg total) by mouth daily.    Dispense:  30 tablet    Refill:  11  . metroNIDAZOLE (METROGEL) 0.75 % vaginal gel    Sig: Place 1 Applicatorful vaginally 2 (two) times daily.    Dispense:  70 g    Refill:  2  . fluconazole (DIFLUCAN) 150 MG tablet    Sig: Take 1 tablet (150 mg total) by mouth once.    Dispense:  1 tablet    Refill:  2  . cephALEXin (KEFLEX) 500 MG capsule    Sig: Take 1 capsule (500 mg total) by mouth 2 (two) times daily.    Dispense:  14 capsule    Refill:  2  . DISCONTD: triamterene-hydrochlorothiazide (DYAZIDE) 37.5-25 MG per capsule 1 capsule    Sig:   . triamterene-hydrochlorothiazide (DYAZIDE) 50-25 MG per capsule    Sig: Take 1 capsule by mouth daily.    Dispense:  30 capsule    Refill:  11   Orders Placed This Encounter  Procedures  . WET PREP BY MOLECULAR PROBE  . GC/Chlamydia Probe Amp  . HIV antibody  . Hepatitis  B surface antigen  . RPR  . Hepatitis C antibody

## 2013-07-17 ENCOUNTER — Telehealth: Payer: Self-pay | Admitting: *Deleted

## 2013-07-17 ENCOUNTER — Ambulatory Visit: Payer: 59 | Admitting: Obstetrics

## 2013-07-17 NOTE — Telephone Encounter (Signed)
Patient called the office regarding a blood pressure prescription. Attempted to contact the patient and left a message for patient to contact the office.

## 2013-07-21 NOTE — Telephone Encounter (Signed)
Patient states when she was last in the office her blood pressure medication was switching to Dyazide 50-25 mg. Patient states she has noticed her heart racing as a side effect. Patient is requesting to be switched back to the HCTZ and just increase the dose. Patient was on 12.5 mg of HCTZ before.

## 2013-07-22 ENCOUNTER — Other Ambulatory Visit: Payer: Self-pay | Admitting: Obstetrics

## 2013-07-24 ENCOUNTER — Ambulatory Visit: Payer: 59 | Admitting: Obstetrics

## 2013-08-19 ENCOUNTER — Encounter: Payer: Self-pay | Admitting: Neurology

## 2013-08-19 ENCOUNTER — Encounter (INDEPENDENT_AMBULATORY_CARE_PROVIDER_SITE_OTHER): Payer: Self-pay

## 2013-08-19 ENCOUNTER — Ambulatory Visit (INDEPENDENT_AMBULATORY_CARE_PROVIDER_SITE_OTHER): Payer: 59 | Admitting: Neurology

## 2013-08-19 VITALS — BP 146/100 | HR 67 | Resp 16 | Ht 64.0 in | Wt 191.0 lb

## 2013-08-19 DIAGNOSIS — R51 Headache: Secondary | ICD-10-CM

## 2013-08-19 DIAGNOSIS — R0683 Snoring: Secondary | ICD-10-CM

## 2013-08-19 DIAGNOSIS — Z6835 Body mass index (BMI) 35.0-35.9, adult: Secondary | ICD-10-CM | POA: Insufficient documentation

## 2013-08-19 DIAGNOSIS — R0989 Other specified symptoms and signs involving the circulatory and respiratory systems: Secondary | ICD-10-CM

## 2013-08-19 DIAGNOSIS — E669 Obesity, unspecified: Secondary | ICD-10-CM

## 2013-08-19 DIAGNOSIS — R0609 Other forms of dyspnea: Secondary | ICD-10-CM

## 2013-08-19 DIAGNOSIS — R519 Headache, unspecified: Secondary | ICD-10-CM

## 2013-08-19 NOTE — Progress Notes (Signed)
[facility] Sleep medicine clinic.   Provider:  Larey Seat, M D  Referring Provider: Dentistry, [place]* Primary Care Physician:  Wyatt Haste, MD  Chief Complaint  Patient presents with  . New Evaluation    Room 11  . Sleep consult    HPI:  Amanda Cox is a 38 y.o.single mother of one, right handed  female , whois seen here as a referral  from Dr. Bennett Scrape, [place] [facility] and [place] for a sleep consultation.  The patient reports that her sleep has been impaired by skin itching. Her sleep improved after her dermatologist treated her, for eczema.  She has been told by her boy friend that she snores, but he doesn't report any apnea.  The patient reports feeling tired but not sleepy per se, she endorsed the FSS at 32 points and the Epworth score at 7 points.  She reports feeling neither refreshed nor restored from nocturnal sleep and struggles to rise in the morning . She uses an alarm clock but hits the snooze button over and again.   She goes to bed by 10 PM and will need 45 minutes to go to sleep. She watches TV in bed. She likes to use her cell phone in bed, playing games.  Once she sleeps, she often has the TV still running in the back ground.  She is a light sleeper and listens to any noise, is hypervigilant. Her bedroom is quiet , dark and warm. She has nocturia 2-3 times , drinks a lot of water . The nocturnal sleep is fragmented by nocturia, overall nightly duration is 6 hours.  She rises at 6.30 to 7 AM .  Drinks coffee and soda 2 times a week. Not regular . Not a smoker and no ETOH use endorsed.          Review of Systems: Out of a complete 14 system review, the patient complains of only the following symptoms, and all other reviewed systems are negative.  itching , weight gain.  Fatigue. Snoring .  History   Social History  . Marital Status: Single    Spouse Name: N/A    Number of Children: 1  . Years of  Education: College   Occupational History  .  [employer]   Social History Main Topics  . Smoking status: Never Smoker   . Smokeless tobacco: Never Used  . Alcohol Use: Yes     Comment: occas.  . Drug Use: No  . Sexual Activity: Yes    Birth Control/ Protection: OCP   Other Topics Concern  . Not on file   Social History Narrative   Patient is single and lives at home, her daughter lives with her.   Patient has a college education.   Patient is right-handed.   Patient drinks sodas three times per month.   Patient has one child.    Family History  Problem Relation Age of Onset  . Diabetes Mother   . Hyperlipidemia Mother   . Arthritis Mother   . Diabetes Sister   . Diabetes Maternal Grandmother   . Arthritis Paternal Grandmother   . Congenital heart disease      Past Medical History  Diagnosis Date  . Irregular menstrual cycle   . Genital herpes, unspecified   . Dyspareunia   . Essential hypertension, benign   . Surveillance of previously prescribed contraceptive pill     Past Surgical History  Procedure Laterality Date  . Breast reduction  surgery    . Cesarean section      Current Outpatient Prescriptions  Medication Sig Dispense Refill  . cephALEXin (KEFLEX) 500 MG capsule Take 1 capsule (500 mg total) by mouth 2 (two) times daily.  14 capsule  2  . fluconazole (DIFLUCAN) 150 MG tablet Take 1 tablet (150 mg total) by mouth once.  1 tablet  2  . lidocaine (XYLOCAINE) 5 % ointment Apply topically 2 (two) times daily.  35.44 g  5  . metroNIDAZOLE (METROGEL) 0.75 % vaginal gel Place 1 Applicatorful vaginally 2 (two) times daily.  70 g  2  . Norgestimate-Ethinyl Estradiol Triphasic (ORTHO TRI-CYCLEN LO) 0.18/0.215/0.25 MG-25 MCG tab Take 1 tablet by mouth daily.  1 Package  11  . triamterene-hydrochlorothiazide (DYAZIDE) 50-25 MG per capsule Take 1 capsule by mouth daily.  30 capsule  11  . valACYclovir (VALTREX) 500 MG tablet Take 1 tablet (500 mg total)  by mouth daily.  30 tablet  11   No current facility-administered medications for this visit.    Allergies as of 08/19/2013 - Review Complete 08/19/2013  Allergen Reaction Noted  . Sulfa antibiotics Shortness Of Breath 09/07/2010    Vitals: BP 146/100  Pulse 67  Resp 16  Ht 5\' 4"  (1.626 m)  Wt 191 lb (86.637 kg)  BMI 32.77 kg/m2  LMP 07/29/2013 Last Weight:  Wt Readings from Last 1 Encounters:  08/19/13 191 lb (86.637 kg)   Last Height:   Ht Readings from Last 1 Encounters:  08/19/13 5\' 4"  (1.626 m)    Physical exam:  General: The patient is awake, alert and appears not in acute distress. The patient is well groomed. Head: Normocephalic, atraumatic. Neck is supple. Mallampati 4, neck circumference:13.75 inches, no TMJ . No retrognathia.  Cardiovascular:  Regular rate and rhythm, without  murmurs or carotid bruit, and without distended neck veins. Respiratory: Lungs are clear to auscultation. Skin:  Without evidence of edema, or rash Trunk: BMI is  elevated and patient has normal posture.  Neurologic exam : The patient is awake and alert, oriented to place and time.  Memory subjective  described as intact. There is a normal attention span & concentration ability.  Speech is fluent without  dysarthria, dysphonia or aphasia. Mood and affect are appropriate.  Cranial nerves: Pupils are equal and briskly reactive to light. Funduscopic exam without evidence of pallor or edema.  Extraocular movements  in vertical and horizontal planes intact and without nystagmus. Visual fields by finger perimetry are intact. Hearing to finger rub intact.  Facial sensation intact to fine touch. Facial motor strength is symmetric and tongue and uvula move midline.  Motor exam:    Normal tone and normal muscle bulk and symmetric normal strength in all extremities.  Sensory:   Proprioception isnormal.  Coordination:  Finger-to-nose maneuver tested and normal without evidence of ataxia,  dysmetria or tremor.  Gait and station: Patient walks without assistive device . Deep tendon reflexes: in the  upper and lower extremities are symmetric and intact.   Assessment:  After physical and neurologic examination, review of laboratory studies, imaging, neurophysiology testing and pre-existing records, assessment is   1) Snoring, Loudly but no witnessed apneas. likely UARS , weight gain.  Risk factors are moderate.  not sure if apnea will be present.   Plan:  Treatment plan and additional workup :  patient has dietary experience and knows " weight watcher" approach . Her upper air way is narrow, unrelated to weight .  Due  to morning headaches, CO2 measures and an in lab study would be helpful.

## 2013-08-19 NOTE — Patient Instructions (Signed)
weightObesity Obesity is defined as having too much total body fat and a body mass index (BMI) of 30 or more. BMI is an estimate of body fat and is calculated from your height and weight. Obesity happens when you consume more calories than you can burn by exercising or performing daily physical tasks. Prolonged obesity can cause major illnesses or emergencies, such as:   A stroke.  Heart disease.  Diabetes.  Cancer.  Arthritis.  High blood pressure (hypertension).  High cholesterol.  Sleep apnea.  Erectile dysfunction.  Infertility problems. CAUSES   Regularly eating unhealthy foods.  Physical inactivity.  Certain disorders, such as an underactive thyroid (hypothyroidism), Cushing's syndrome, and polycystic ovarian syndrome.  Certain medicines, such as steroids, some depression medicines, and antipsychotics.  Genetics.  Lack of sleep. DIAGNOSIS  A caregiver can diagnose obesity after calculating your BMI. Obesity will be diagnosed if your BMI is 30 or higher.  There are other methods of measuring obesity levels. Some other methods include measuring your skin fold thickness, your waist circumference, and comparing your hip circumference to your waist circumference. TREATMENT  A healthy treatment program includes some or all of the following:  Long-term dietary changes.  Exercise and physical activity.  Behavioral and lifestyle changes.  Medicine only under the supervision of your caregiver. Medicines may help, but only if they are used with diet and exercise programs. An unhealthy treatment program includes:  Fasting.  Fad diets.  Supplements and drugs. These choices do not succeed in long-term weight control.  HOME CARE INSTRUCTIONS   Exercise and perform physical activity as directed by your caregiver. To increase physical activity, try the following:  Use stairs instead of elevators.  Park farther away from store entrances.  Garden, bike, or walk  instead of watching television or using the computer.  Eat healthy, low-calorie foods and drinks on a regular basis. Eat more fruits and vegetables. Use low-calorie cookbooks or take healthy cooking classes.  Limit fast food, sweets, and processed snack foods.  Eat smaller portions.  Keep a daily journal of everything you eat. There are many free websites to help you with this. It may be helpful to measure your foods so you can determine if you are eating the correct portion sizes.  Avoid drinking alcohol. Drink more water and drinks without calories.  Take vitamins and supplements only as recommended by your caregiver.  Weight-loss support groups, Nurse, mental health, counselors, and stress reduction education can also be very helpful. SEEK IMMEDIATE MEDICAL CARE IF:  You have chest pain or tightness.  You have trouble breathing or feel short of breath.  You have weakness or leg numbness.  You feel confused or have trouble talking.  You have sudden changes in your vision. MAKE SURE YOU:  Understand these instructions.  Will watch your condition.  Will get help right away if you are not doing well or get worse. Document Released: 03/16/2004 Document Revised: 08/08/2011 Document Reviewed: 03/15/2011 [facility] Patient Information 2015 [place], [place]. This information is not intended to replace advice given to you by your health care provider. Make sure you discuss any questions you have with your health care provider.

## 2013-08-25 ENCOUNTER — Telehealth: Payer: Self-pay | Admitting: *Deleted

## 2013-08-25 DIAGNOSIS — I10 Essential (primary) hypertension: Secondary | ICD-10-CM

## 2013-08-25 MED ORDER — HYDROCHLOROTHIAZIDE 25 MG PO TABS: 25.0000 mg | ORAL_TABLET | Freq: Every day | ORAL | Status: DC

## 2013-08-25 NOTE — Telephone Encounter (Signed)
Patient called stating instead of Trimenta she would rather go back to her HCTZ and increase it to 25mg . Patient would like medication sent to [facility] on [address].

## 2013-09-17 NOTE — Telephone Encounter (Signed)
Followed up with patient regarding her medication. Patient states she was notified by pharmacy about her Rx. Patient denies any concerns.

## 2013-09-17 NOTE — Telephone Encounter (Signed)
Tried to contact the patient to make sure that she was aware that Dr. Jodi Mourning had switched her Medication back and increased it. LM on VM to CB.

## 2013-09-17 NOTE — Telephone Encounter (Signed)
LM on VM to CB

## 2013-09-25 ENCOUNTER — Telehealth: Payer: Self-pay | Admitting: *Deleted

## 2013-09-25 NOTE — Telephone Encounter (Signed)
Patient states she took an at home UTI test which showed she has a UTI. Patient is requesting a refill on Cipro for her UTI. Advised patient would send message to Dr.Harper.

## 2013-10-07 NOTE — Telephone Encounter (Signed)
Please Advise

## 2013-10-08 ENCOUNTER — Other Ambulatory Visit: Payer: Self-pay | Admitting: Obstetrics

## 2013-10-08 DIAGNOSIS — N390 Urinary tract infection, site not specified: Secondary | ICD-10-CM

## 2013-10-08 MED ORDER — CIPROFLOXACIN HCL 500 MG PO TABS: 500.0000 mg | ORAL_TABLET | Freq: Two times a day (BID) | ORAL | Status: DC

## 2013-10-08 NOTE — Telephone Encounter (Signed)
Cipro Rx. Done.

## 2013-10-10 ENCOUNTER — Encounter: Payer: Self-pay | Admitting: Neurology

## 2013-10-10 ENCOUNTER — Ambulatory Visit (INDEPENDENT_AMBULATORY_CARE_PROVIDER_SITE_OTHER): Payer: 59 | Admitting: Neurology

## 2013-10-10 DIAGNOSIS — E669 Obesity, unspecified: Secondary | ICD-10-CM

## 2013-10-10 DIAGNOSIS — R51 Headache: Secondary | ICD-10-CM

## 2013-10-10 DIAGNOSIS — R0683 Snoring: Secondary | ICD-10-CM

## 2013-10-10 DIAGNOSIS — G4700 Insomnia, unspecified: Secondary | ICD-10-CM

## 2013-10-10 DIAGNOSIS — R519 Headache, unspecified: Secondary | ICD-10-CM

## 2013-10-29 ENCOUNTER — Telehealth: Payer: Self-pay | Admitting: *Deleted

## 2013-10-29 NOTE — Telephone Encounter (Signed)
Patient contacted and given results of diagnostic study performed on 10/10/2013.  Patient was informed we will mail her a copy of the results and also provide a copy to the referring DDS, Barth Kirks.  Pt was instructed to call the office if there was any questions.

## 2013-11-01 ENCOUNTER — Encounter: Payer: Self-pay | Admitting: Emergency Medicine

## 2013-11-01 ENCOUNTER — Ambulatory Visit (INDEPENDENT_AMBULATORY_CARE_PROVIDER_SITE_OTHER): Payer: 59 | Admitting: Emergency Medicine

## 2013-11-01 VITALS — BP 122/78 | HR 78 | Temp 98.4°F | Resp 16 | Ht 63.0 in | Wt 195.0 lb

## 2013-11-01 DIAGNOSIS — I10 Essential (primary) hypertension: Secondary | ICD-10-CM

## 2013-11-01 NOTE — Patient Instructions (Signed)

## 2013-11-01 NOTE — Progress Notes (Signed)
[facility] [address] [place] [address] [phone]  Date:  11/01/2013   Name:  Amanda Cox   DOB:  1975-12-04   MRN:  [id number]  PCP:  Amanda Haste, MD    Chief Complaint: Edema, Headache and Weight Gain   History of Present Illness:  Amanda Cox is a 38 y.o. very pleasant female patient who presents with the following:  Concerned about weight gain on vacation.  Has trace edema lowers.  Feels very uncomfortable.   No chest pain or shortness of breath.  No improvement with over the counter medications or other home remedies.  Denies other complaint or health concern today.    Patient Active Problem List   Diagnosis Date Noted  . Snoring 08/19/2013  . Obesity, unspecified 08/19/2013  . Screening examination for venereal disease 07/10/2013  . Essential hypertension, benign 07/10/2013  . Urinary tract infection, site not specified 07/10/2013  . Candidiasis of vulva and vagina 07/10/2013  . Genital herpes, unspecified 07/17/2012  . BV (bacterial vaginosis) 07/17/2012    Past Medical History  Diagnosis Date  . Irregular menstrual cycle   . Genital herpes, unspecified   . Dyspareunia   . Essential hypertension, benign   . Surveillance of previously prescribed contraceptive pill     Past Surgical History  Procedure Laterality Date  . Breast reduction surgery    . Cesarean section      History  Substance Use Topics  . Smoking status: Never Smoker   . Smokeless tobacco: Never Used  . Alcohol Use: Yes     Comment: occas.    Family History  Problem Relation Age of Onset  . Diabetes Mother   . Hyperlipidemia Mother   . Arthritis Mother   . Diabetes Sister   . Diabetes Maternal Grandmother   . Arthritis Paternal Grandmother   . Congenital heart disease      Allergies  Allergen Reactions  . Sulfa Antibiotics Shortness Of Breath    Medication list has been reviewed and updated.  Current Outpatient  Prescriptions on File Prior to Visit  Medication Sig Dispense Refill  . ciprofloxacin (CIPRO) 500 MG tablet Take 1 tablet (500 mg total) by mouth 2 (two) times daily.  6 tablet  0  . fluconazole (DIFLUCAN) 150 MG tablet Take 1 tablet (150 mg total) by mouth once.  1 tablet  2  . hydrochlorothiazide (HYDRODIURIL) 25 MG tablet Take 1 tablet (25 mg total) by mouth daily.  30 tablet  11  . metroNIDAZOLE (METROGEL) 0.75 % vaginal gel Place 1 Applicatorful vaginally 2 (two) times daily.  70 g  2  . valACYclovir (VALTREX) 500 MG tablet Take 1 tablet (500 mg total) by mouth daily.  30 tablet  11   No current facility-administered medications on file prior to visit.    Review of Systems:  As per HPI, otherwise negative.    Physical Examination: Filed Vitals:   11/01/13 0926  BP: 122/78  Pulse: 78  Temp: 98.4 F (36.9 C)  Resp: 16   Filed Vitals:   11/01/13 0926  Height: 5\' 3"  (1.6 m)  Weight: 195 lb (88.451 kg)   Body mass index is 34.55 kg/(m^2). Ideal Body Weight: Weight in (lb) to have BMI = 25: 140.8   GEN: WDWN, NAD, Non-toxic, Alert & Oriented x 3 HEENT: Atraumatic, Normocephalic.  Ears and Nose: No external deformity. EXTR: no clubbing/cyanosis/trace edema NEURO: Normal gait.  PSYCH: Normally interactive. Conversant. Not depressed or  anxious appearing.  Calm demeanor.  CHEST clear BS=.    Assessment and Plan: Hypertension under good control Weight gain Trace edema  Signed,  Ellison Carwin, MD

## 2013-11-29 ENCOUNTER — Ambulatory Visit (INDEPENDENT_AMBULATORY_CARE_PROVIDER_SITE_OTHER): Payer: 59 | Admitting: Family Medicine

## 2013-11-29 VITALS — BP 122/76 | HR 90 | Temp 97.8°F | Resp 16 | Ht 63.25 in | Wt 180.0 lb

## 2013-11-29 DIAGNOSIS — A6000 Herpesviral infection of urogenital system, unspecified: Secondary | ICD-10-CM

## 2013-11-29 DIAGNOSIS — B373 Candidiasis of vulva and vagina: Secondary | ICD-10-CM

## 2013-11-29 DIAGNOSIS — B3731 Acute candidiasis of vulva and vagina: Secondary | ICD-10-CM

## 2013-11-29 LAB — POCT UA - MICROSCOPIC ONLY
Bacteria, U Microscopic: NEGATIVE
CASTS, UR, LPF, POC: NEGATIVE
CRYSTALS, UR, HPF, POC: NEGATIVE
MUCUS UA: NEGATIVE
RBC, urine, microscopic: NEGATIVE
Yeast, UA: NEGATIVE

## 2013-11-29 LAB — POCT WET PREP WITH KOH
CLUE CELLS WET PREP PER HPF POC: NEGATIVE
KOH Prep POC: POSITIVE
RBC WET PREP PER HPF POC: NEGATIVE
Trichomonas, UA: NEGATIVE
Yeast Wet Prep HPF POC: NEGATIVE

## 2013-11-29 LAB — POCT URINALYSIS DIPSTICK
GLUCOSE UA: NEGATIVE
KETONES UA: 40
LEUKOCYTES UA: NEGATIVE
NITRITE UA: NEGATIVE
Protein, UA: 30
Spec Grav, UA: 1.02
Urobilinogen, UA: 0.2
pH, UA: 7

## 2013-11-29 LAB — RPR

## 2013-11-29 LAB — HIV ANTIBODY (ROUTINE TESTING W REFLEX): HIV 1&2 Ab, 4th Generation: NONREACTIVE

## 2013-11-29 MED ORDER — FLUCONAZOLE 150 MG PO TABS: 150.0000 mg | ORAL_TABLET | ORAL | Status: DC

## 2013-11-29 NOTE — Progress Notes (Signed)
This chart was scribed for Delman Cheadle, MD by Einar Pheasant, ED Scribe. This patient was seen in room 14 and the patient's care was started at 9:27 AM.  Subjective:    Patient ID: Amanda Cox, female    DOB: 11-04-1975, 38 y.o.   MRN: [id number]  Chief Complaint  Patient presents with  . Vaginal Itching    started Sunday--heavier amount of discharge  . Exposure to STD    wanted to get tested for STD, Chlamydia    HPI Amanda Cox is a 38 y.o. female with a hx of BV and yeast infections  Today pt presents to the office complaining of vaginal itching that started approximately 1 week ago. Pt reports taking Diflucan pill one week ago and she did not notice any changes in her symptoms. She states that she gets a yeast or BV infection once or twice a month in the past, however, for this year this current discomfort in her first one. Pt states that she prefers the vaginal gel because the pill always tends to make her sick. Pt does endorse a hx of HSV. Today, she would like to be screened for other STDs. She denies any fever, chills, nausea, emesis, abdominal pain, vaginal bleeding, dysuria, or pelvic pain.   Patient Active Problem List   Diagnosis Date Noted  . Snoring 08/19/2013  . Obesity, unspecified 08/19/2013  . Screening examination for venereal disease 07/10/2013  . Essential hypertension, benign 07/10/2013  . Urinary tract infection, site not specified 07/10/2013  . Candidiasis of vulva and vagina 07/10/2013  . Genital herpes, unspecified 07/17/2012  . BV (bacterial vaginosis) 07/17/2012   Past Medical History  Diagnosis Date  . Irregular menstrual cycle   . Genital herpes, unspecified   . Dyspareunia   . Essential hypertension, benign   . Surveillance of previously prescribed contraceptive pill    Past Surgical History  Procedure Laterality Date  . Breast reduction surgery    . Cesarean section     Allergies  Allergen Reactions  . Sulfa Antibiotics Shortness  Of Breath   Prior to Admission medications   Medication Sig Start Date End Date Taking? Authorizing Provider  fluconazole (DIFLUCAN) 150 MG tablet Take 1 tablet (150 mg total) by mouth once. 07/10/13  Yes [name], MD  hydrochlorothiazide (HYDRODIURIL) 25 MG tablet Take 1 tablet (25 mg total) by mouth daily. 08/25/13  Yes [name], MD  metroNIDAZOLE (METROGEL) 0.75 % vaginal gel Place 1 Applicatorful vaginally 2 (two) times daily. 07/10/13  Yes [name], MD  valACYclovir (VALTREX) 500 MG tablet Take 1 tablet (500 mg total) by mouth daily. 07/10/13  Yes [name], MD   History   Social History  . Marital Status: Single    Spouse Name: N/A    Number of Children: 1  . Years of Education: College   Occupational History  .  [employer]   Social History Main Topics  . Smoking status: Never Smoker   . Smokeless tobacco: Never Used  . Alcohol Use: Yes     Comment: occas.  . Drug Use: No  . Sexual Activity: Yes    Birth Control/ Protection: OCP   Other Topics Concern  . Not on file   Social History Narrative   Patient is single and lives at home, her daughter lives with her.   Patient has a college education.   Patient is right-handed.   Patient drinks sodas three times per month.  Patient has one child.     Review of Systems  Constitutional: Negative for appetite change and fatigue.  HENT: Negative for congestion, ear discharge and sinus pressure.   Eyes: Negative for discharge.  Respiratory: Negative for cough.   Cardiovascular: Negative for chest pain.  Gastrointestinal: Negative for abdominal pain and diarrhea.  Genitourinary: Positive for vaginal discharge. Negative for frequency, hematuria, vaginal bleeding and pelvic pain.       Vaginal itching  Musculoskeletal: Negative for back pain.  Skin: Negative for rash.  Neurological: Negative for seizures and headaches.  Psychiatric/Behavioral: Negative for hallucinations.      Triage  Vitals: BP 122/76  Pulse 90  Temp(Src) 97.8 F (36.6 C) (Oral)  Resp 16  Ht 5' 3.25" (1.607 m)  Wt 180 lb (81.647 kg)  BMI 31.62 kg/m2  SpO2 99%  LMP 11/12/2013  Objective:   Physical Exam  Nursing note and vitals reviewed. Constitutional: She appears well-developed and well-nourished. No distress.  HENT:  Head: Normocephalic and atraumatic.  Eyes: Conjunctivae are normal. Right eye exhibits no discharge. Left eye exhibits no discharge.  Neck: Neck supple.  Cardiovascular: Normal rate, regular rhythm and normal heart sounds.  Exam reveals no gallop and no friction rub.   No murmur heard. Pulmonary/Chest: Effort normal and breath sounds normal. No respiratory distress.  Abdominal: Soft. She exhibits no distension. There is no tenderness.  Genitourinary: Vagina normal and uterus normal. Cervix exhibits no motion tenderness. Right adnexum displays no mass and no fullness. Left adnexum displays no mass and no fullness.  Normal labia minora and majora. Moderate amout of thick white clumpy discharge. Vagina and cervix normal. No palpable masses by manual exam. No CMT.   Musculoskeletal: She exhibits no edema and no tenderness.  Neurological: She is alert.  Skin: Skin is warm and dry.  Psychiatric: She has a normal mood and affect. Her behavior is normal. Thought content normal.     Results for orders placed in visit on 11/29/13  POCT UA - MICROSCOPIC ONLY      Result Value Ref Range   WBC, Ur, HPF, POC 0-3     RBC, urine, microscopic neg     Bacteria, U Microscopic neg     Mucus, UA neg     Epithelial cells, urine per micros 0-3     Crystals, Ur, HPF, POC neg     Casts, Ur, LPF, POC neg     Yeast, UA neg    POCT URINALYSIS DIPSTICK      Result Value Ref Range   Color, UA yellow     Clarity, UA clear     Glucose, UA neg     Bilirubin, UA sm     Ketones, UA 40     Spec Grav, UA 1.020     Blood, UA trace     pH, UA 7.0     Protein, UA 30     Urobilinogen, UA 0.2      Nitrite, UA neg     Leukocytes, UA Negative    POCT WET PREP WITH KOH      Result Value Ref Range   Trichomonas, UA Negative     Clue Cells Wet Prep HPF POC neg     Epithelial Wet Prep HPF POC 1-6     Yeast Wet Prep HPF POC neg     Bacteria Wet Prep HPF POC 2+     RBC Wet Prep HPF POC neg     WBC Wet Prep HPF  POC 2-10     KOH Prep POC Positive          Assessment & Plan:   Vaginal moniliasis  Genital herpes - Plan: POCT UA - Microscopic Only, POCT urinalysis dipstick, HIV antibody, RPR, Hepatitis C antibody, GC/Chlamydia Probe Amp, POCT Wet Prep with KOH, POCT UA - Microscopic Only  Candidiasis of vulva and vagina - Plan: fluconazole (DIFLUCAN) 150 MG tablet  Meds ordered this encounter  Medications  . fluconazole (DIFLUCAN) 150 MG tablet    Sig: Take 1 tablet (150 mg total) by mouth every 3 (three) days.    Dispense:  3 tablet    Refill:  1    I personally performed the services described in this documentation, which was scribed in my presence. The recorded information has been reviewed and considered, and addended by me as needed.  Delman Cheadle, MD MPH

## 2013-11-29 NOTE — Patient Instructions (Signed)

## 2013-11-30 LAB — HEPATITIS C ANTIBODY: HCV AB: NEGATIVE

## 2013-11-30 NOTE — Progress Notes (Deleted)
   Subjective:    Patient ID: Amanda Cox, female    DOB: Aug 15, 1975, 38 y.o.   MRN: [id number]  HPI    Review of Systems     Objective:   Physical Exam        Assessment & Plan:

## 2013-12-01 ENCOUNTER — Encounter: Payer: Self-pay | Admitting: Family Medicine

## 2013-12-01 LAB — GC/CHLAMYDIA PROBE AMP
CT Probe RNA: NEGATIVE
GC Probe RNA: NEGATIVE

## 2013-12-09 ENCOUNTER — Other Ambulatory Visit: Payer: Self-pay | Admitting: Obstetrics

## 2014-02-03 ENCOUNTER — Telehealth: Payer: Self-pay | Admitting: *Deleted

## 2014-02-03 NOTE — Telephone Encounter (Signed)
Patient states she would like a refill on a Cipro prescription. Patient believes she may have a UTI. Patient advised she would need to come by the office and leave a specimen to send for culture. Patient verbalizes understanding.

## 2014-05-19 ENCOUNTER — Other Ambulatory Visit: Payer: Self-pay | Admitting: Obstetrics

## 2014-06-20 ENCOUNTER — Ambulatory Visit (INDEPENDENT_AMBULATORY_CARE_PROVIDER_SITE_OTHER): Payer: 59 | Admitting: Family Medicine

## 2014-06-20 VITALS — BP 120/70 | HR 66 | Temp 98.0°F | Resp 20 | Ht 63.5 in | Wt 173.0 lb

## 2014-06-20 DIAGNOSIS — B9689 Other specified bacterial agents as the cause of diseases classified elsewhere: Secondary | ICD-10-CM

## 2014-06-20 DIAGNOSIS — A499 Bacterial infection, unspecified: Secondary | ICD-10-CM | POA: Diagnosis not present

## 2014-06-20 DIAGNOSIS — R102 Pelvic and perineal pain: Secondary | ICD-10-CM | POA: Diagnosis not present

## 2014-06-20 DIAGNOSIS — R3915 Urgency of urination: Secondary | ICD-10-CM | POA: Diagnosis not present

## 2014-06-20 DIAGNOSIS — N760 Acute vaginitis: Secondary | ICD-10-CM | POA: Diagnosis not present

## 2014-06-20 LAB — POCT UA - MICROSCOPIC ONLY
Bacteria, U Microscopic: NEGATIVE
CRYSTALS, UR, HPF, POC: NEGATIVE
Casts, Ur, LPF, POC: NEGATIVE
Epithelial cells, urine per micros: NEGATIVE
Mucus, UA: NEGATIVE
Yeast, UA: NEGATIVE

## 2014-06-20 LAB — POCT URINALYSIS DIPSTICK
Bilirubin, UA: NEGATIVE
Blood, UA: NEGATIVE
Glucose, UA: NEGATIVE
KETONES UA: NEGATIVE
LEUKOCYTES UA: NEGATIVE
Nitrite, UA: NEGATIVE
Protein, UA: NEGATIVE
Spec Grav, UA: 1.02
UROBILINOGEN UA: 0.2
pH, UA: 7

## 2014-06-20 LAB — POCT WET PREP WITH KOH
KOH Prep POC: NEGATIVE
RBC Wet Prep HPF POC: NEGATIVE
Trichomonas, UA: NEGATIVE
Yeast Wet Prep HPF POC: NEGATIVE

## 2014-06-20 MED ORDER — CLOTRIMAZOLE 2 % VA CREA: TOPICAL_CREAM | VAGINAL | Status: DC

## 2014-06-20 MED ORDER — TINIDAZOLE 500 MG PO TABS: 2.0000 g | ORAL_TABLET | Freq: Every day | ORAL | Status: DC

## 2014-06-20 NOTE — Progress Notes (Signed)
Subjective:    Patient ID: Amanda Cox, female    DOB: 11-08-1975, 39 y.o.   MRN: [id number] This chart was scribed for Amanda Knapp, MD by Martinique Peace, ED Scribe. The patient was seen in [facility]. The patient's care was started at 11:15 AM.  Chief Complaint  Patient presents with  . Urinary Tract Infection    C/O uregency & frequency x 1 week  . Vaginitis    c/o burning & itching x 1 week     HPI HPI Comments: Amanda Cox is a 39 y.o. female who presents to [facility] complaining of vaginal  Irritation and itching.  Had a few metronidazole vag supposities but no need now.as partially treated.  She also thinks she is developing a sinus infection.      Past Medical History  Diagnosis Date  . Irregular menstrual cycle   . Genital herpes, unspecified   . Dyspareunia   . Essential hypertension, benign   . Surveillance of previously prescribed contraceptive pill    Allergies  Allergen Reactions  . Sulfa Antibiotics Shortness Of Breath   Current Outpatient Prescriptions on File Prior to Visit  Medication Sig Dispense Refill  . hydrochlorothiazide (HYDRODIURIL) 25 MG tablet Take 1 tablet (25 mg total) by mouth daily. 30 tablet 11  . valACYclovir (VALTREX) 500 MG tablet Take 1 tablet (500 mg total) by mouth daily. (Patient not taking: Reported on 06/20/2014) 30 tablet 11   No current facility-administered medications on file prior to visit.   Filed Vitals:   06/20/14 1048  BP: 120/70  Pulse: 66  Temp: 98 F (36.7 C)  Resp: 20   Past Surgical History  Procedure Laterality Date  . Breast reduction surgery    . Cesarean section     Family History  Problem Relation Age of Onset  . Diabetes Mother   . Hyperlipidemia Mother   . Arthritis Mother   . Diabetes Sister   . Diabetes Maternal Grandmother   . Arthritis Paternal Grandmother   . Congenital heart disease     History  Substance Use Topics  . Smoking status: Never Smoker   . Smokeless tobacco: Never Used  .  Alcohol Use: 0.0 oz/week    0 Standard drinks or equivalent per week     Comment: occas.       Review of Systems  Constitutional: Negative for fever, chills, diaphoresis, activity change, appetite change, fatigue and unexpected weight change.  Gastrointestinal: Negative for abdominal pain, diarrhea, constipation, blood in stool, anal bleeding and rectal pain.  Genitourinary: Positive for dysuria, urgency, frequency, vaginal discharge, vaginal pain and pelvic pain. Negative for hematuria, decreased urine volume, vaginal bleeding, difficulty urinating, genital sores, menstrual problem and dyspareunia.  Musculoskeletal: Negative for gait problem.  Skin: Positive for rash.  Hematological: Negative for adenopathy.  Psychiatric/Behavioral: The patient is nervous/anxious.        Objective:   Physical Exam  Constitutional: She is oriented to person, place, and time. She appears well-developed and well-nourished. No distress.  HENT:  Head: Normocephalic and atraumatic.  Eyes: Conjunctivae and EOM are normal.  Neck: Neck supple. No tracheal deviation present.  Cardiovascular: Normal rate.   Pulmonary/Chest: Effort normal. No respiratory distress.  Musculoskeletal: Normal range of motion.  Neurological: She is alert and oriented to person, place, and time.  Skin: Skin is warm and dry.  Psychiatric: She has a normal mood and affect. Her behavior is normal.  Nursing note and vitals reviewed. BP 120/70 mmHg  Pulse 66  Temp(Src) 98 F (36.7 C) (Oral)  Resp 20  Ht 5' 3.5" (1.613 m)  Wt 173 lb (78.472 kg)  BMI 30.16 kg/m2  SpO2     11:17 AM- Treatment plan was discussed with patient who verbalizes understanding and agrees.      Assessment & Plan:   1. Urinary urgency   2. Vaginal pain   3.     Bacterial Vaginosis - try med other than metronidazole as she used the per vaginal application for for the past few d. So wlil try pt on tindamax -  If cana't tolerate then try  clindamyin.  Orders Placed This Encounter  Procedures  . Urine culture  . GC/Chlamydia Probe Amp  . POCT UA - Microscopic Only  . POCT urinalysis dipstick  . POCT Wet Prep with KOH    Meds ordered this encounter  Medications  . tinidazole (TINDAMAX) 500 MG tablet    Sig: Take 4 tablets (2,000 mg total) by mouth daily with breakfast.    Dispense:  8 tablet    Refill:  0  . clotrimazole (GYNE-LOTRIMIN 3) 2 % vaginal cream    Sig: Apply externally as needed for itching.    Dispense:  21 g    Refill:  2    Delman Cheadle, MD MPH  Results for orders placed or performed in visit on 06/20/14  Urine culture  Result Value Ref Range   Colony Count NO GROWTH    Organism ID, Bacteria NO GROWTH   POCT UA - Microscopic Only  Result Value Ref Range   WBC, Ur, HPF, POC 0-1    RBC, urine, microscopic 0-1    Bacteria, U Microscopic neg    Mucus, UA neg    Epithelial cells, urine per micros neg    Crystals, Ur, HPF, POC neg    Casts, Ur, LPF, POC neg    Yeast, UA neg   POCT urinalysis dipstick  Result Value Ref Range   Color, UA yellow    Clarity, UA clear    Glucose, UA neg    Bilirubin, UA neg    Ketones, UA neg    Spec Grav, UA 1.020    Blood, UA neg    pH, UA 7.0    Protein, UA neg    Urobilinogen, UA 0.2    Nitrite, UA neg    Leukocytes, UA Negative   POCT Wet Prep with KOH  Result Value Ref Range   Trichomonas, UA Negative    Clue Cells Wet Prep HPF POC tntc    Epithelial Wet Prep HPF POC tntc    Yeast Wet Prep HPF POC neg    Bacteria Wet Prep HPF POC 4+    RBC Wet Prep HPF POC neg    WBC Wet Prep HPF POC 0-3    KOH Prep POC Negative

## 2014-06-20 NOTE — Patient Instructions (Signed)
Bacterial Vaginosis Bacterial vaginosis is a vaginal infection that occurs when the normal balance of bacteria in the vagina is disrupted. It results from an overgrowth of certain bacteria. This is the most common vaginal infection in women of childbearing age. Treatment is important to prevent complications, especially in pregnant women, as it can cause a premature delivery. CAUSES  Bacterial vaginosis is caused by an increase in harmful bacteria that are normally present in smaller amounts in the vagina. Several different kinds of bacteria can cause bacterial vaginosis. However, the reason that the condition develops is not fully understood. RISK FACTORS Certain activities or behaviors can put you at an increased risk of developing bacterial vaginosis, including:  Having a new sex partner or multiple sex partners.  Douching.  Using an intrauterine device (IUD) for contraception. Women do not get bacterial vaginosis from toilet seats, bedding, swimming pools, or contact with objects around them. SIGNS AND SYMPTOMS  Some women with bacterial vaginosis have no signs or symptoms. Common symptoms include:  Grey vaginal discharge.  A fishlike odor with discharge, especially after sexual intercourse.  Itching or burning of the vagina and vulva.  Burning or pain with urination. DIAGNOSIS  Your health care provider will take a medical history and examine the vagina for signs of bacterial vaginosis. A sample of vaginal fluid may be taken. Your health care provider will look at this sample under a microscope to check for bacteria and abnormal cells. A vaginal pH test may also be done.  TREATMENT  Bacterial vaginosis may be treated with antibiotic medicines. These may be given in the form of a pill or a vaginal cream. A second round of antibiotics may be prescribed if the condition comes back after treatment.  HOME CARE INSTRUCTIONS   Only take over-the-counter or prescription medicines as  directed by your health care provider.  If antibiotic medicine was prescribed, take it as directed. Make sure you finish it even if you start to feel better.  Do not have sex until treatment is completed.  Tell all sexual partners that you have a vaginal infection. They should see their health care provider and be treated if they have problems, such as a mild rash or itching.  Practice safe sex by using condoms and only having one sex partner. SEEK MEDICAL CARE IF:   Your symptoms are not improving after 3 days of treatment.  You have increased discharge or pain.  You have a fever. MAKE SURE YOU:   Understand these instructions.  Will watch your condition.  Will get help right away if you are not doing well or get worse. FOR MORE INFORMATION  Centers for Disease Control and Prevention, Division of STD Prevention: www.cdc.gov/std American Sexual Health Association (ASHA): www.ashastd.org  Document Released: 02/06/2005 Document Revised: 11/27/2012 Document Reviewed: 09/18/2012 ExitCare Patient Information 2015 [employer]. This information is not intended to replace advice given to you by your health care provider. Make sure you discuss any questions you have with your health care provider.  

## 2014-06-21 LAB — URINE CULTURE
Colony Count: NO GROWTH
Organism ID, Bacteria: NO GROWTH

## 2014-06-22 LAB — GC/CHLAMYDIA PROBE AMP
CT Probe RNA: NEGATIVE
GC PROBE AMP APTIMA: NEGATIVE

## 2014-06-24 ENCOUNTER — Other Ambulatory Visit: Payer: Self-pay | Admitting: Obstetrics

## 2014-06-25 ENCOUNTER — Telehealth: Payer: Self-pay | Admitting: Family Medicine

## 2014-06-25 NOTE — Telephone Encounter (Signed)
Spoke to pt and informed her of information Dr. Brigitte Pulse gave. Pt voiced understanding.

## 2014-06-25 NOTE — Telephone Encounter (Signed)
Pt finished the pills on Monday. The pharmacy actually gave her 12 tablets instead of 8 and she took all of them. She is still having the same symptoms. Does she need to wait longer to come back in now? She says also, now, her menstrual cycle has started, and she didn't know if that might have something to do with it too.

## 2014-06-25 NOTE — Telephone Encounter (Signed)
Patient states that she has finished taking all the medication she was prescribed on 06/20/2014. She states that she was instructed to call back if her symptoms have not cleared. [facility] [address].  [phone]

## 2014-06-25 NOTE — Telephone Encounter (Signed)
We cannot do any of the repeat testing during her menstrual cycle so wait until after her menses is 100% over, if she then develops recurrent symptoms, then she should come back in for recheck.  A lot of times during the menstrual cycle will help clean out some of the older symptoms.  If she is having abdominal pain, burning with urination or other she can come in sooner.

## 2014-06-25 NOTE — Telephone Encounter (Signed)
If pt took the tindazole she needs to give it 2-3 days to work. If she is still having symptoms at that time, needs to come in for a repeat exam so we can figure out what we are missing.   What I told pt was that if she vomited up the tindazole and couldn't take it, then I would call her in something else but definitely not safe at all to add in another antibiotic w/o repeat check

## 2014-07-02 ENCOUNTER — Encounter: Payer: Self-pay | Admitting: Family Medicine

## 2014-07-14 ENCOUNTER — Ambulatory Visit (INDEPENDENT_AMBULATORY_CARE_PROVIDER_SITE_OTHER): Payer: 59 | Admitting: Obstetrics

## 2014-07-14 ENCOUNTER — Encounter: Payer: Self-pay | Admitting: Obstetrics

## 2014-07-14 VITALS — BP 125/90 | HR 70 | Temp 99.1°F | Ht 63.0 in | Wt 171.0 lb

## 2014-07-14 DIAGNOSIS — B3731 Acute candidiasis of vulva and vagina: Secondary | ICD-10-CM

## 2014-07-14 DIAGNOSIS — Z124 Encounter for screening for malignant neoplasm of cervix: Secondary | ICD-10-CM

## 2014-07-14 DIAGNOSIS — Z3041 Encounter for surveillance of contraceptive pills: Secondary | ICD-10-CM | POA: Diagnosis not present

## 2014-07-14 DIAGNOSIS — Z113 Encounter for screening for infections with a predominantly sexual mode of transmission: Secondary | ICD-10-CM

## 2014-07-14 DIAGNOSIS — B373 Candidiasis of vulva and vagina: Secondary | ICD-10-CM | POA: Diagnosis not present

## 2014-07-14 DIAGNOSIS — Z01419 Encounter for gynecological examination (general) (routine) without abnormal findings: Secondary | ICD-10-CM

## 2014-07-14 DIAGNOSIS — I10 Essential (primary) hypertension: Secondary | ICD-10-CM

## 2014-07-14 DIAGNOSIS — N760 Acute vaginitis: Secondary | ICD-10-CM | POA: Diagnosis not present

## 2014-07-14 DIAGNOSIS — A499 Bacterial infection, unspecified: Secondary | ICD-10-CM | POA: Diagnosis not present

## 2014-07-14 DIAGNOSIS — B9689 Other specified bacterial agents as the cause of diseases classified elsewhere: Secondary | ICD-10-CM

## 2014-07-14 DIAGNOSIS — A6000 Herpesviral infection of urogenital system, unspecified: Secondary | ICD-10-CM

## 2014-07-14 DIAGNOSIS — N946 Dysmenorrhea, unspecified: Secondary | ICD-10-CM | POA: Diagnosis not present

## 2014-07-14 MED ORDER — IBUPROFEN 800 MG PO TABS
800.0000 mg | ORAL_TABLET | Freq: Three times a day (TID) | ORAL | Status: DC | PRN
Start: 2014-07-14 — End: 2015-06-16

## 2014-07-14 MED ORDER — HYDROCHLOROTHIAZIDE 25 MG PO TABS: 25.0000 mg | ORAL_TABLET | Freq: Every day | ORAL | Status: DC

## 2014-07-14 MED ORDER — NORGESTIM-ETH ESTRAD TRIPHASIC 0.18/0.215/0.25 MG-25 MCG PO TABS: 1.0000 | ORAL_TABLET | Freq: Every day | ORAL | Status: DC

## 2014-07-14 MED ORDER — VALACYCLOVIR HCL 500 MG PO TABS: 500.0000 mg | ORAL_TABLET | Freq: Every day | ORAL | Status: DC

## 2014-07-14 MED ORDER — METRONIDAZOLE 0.75 % VA GEL: 1.0000 | Freq: Two times a day (BID) | VAGINAL | Status: DC

## 2014-07-14 MED ORDER — FLUCONAZOLE 150 MG PO TABS: 150.0000 mg | ORAL_TABLET | Freq: Once | ORAL | Status: DC

## 2014-07-14 NOTE — Progress Notes (Signed)
Subjective:        Amanda Cox is a 39 y.o. female here for a routine exam.  Current complaints: None.    Personal health questionnaire:  Is patient [name], have a family history of breast and/or ovarian cancer: no Is there a family history of uterine cancer diagnosed at age < 54, gastrointestinal cancer, urinary tract cancer, family member who is a Field seismologist syndrome-associated carrier: no Is the patient overweight and hypertensive, family history of diabetes, personal history of gestational diabetes, preeclampsia or PCOS: no Is patient over 69, have PCOS,  family history of premature CHD under age 62, diabetes, smoke, have hypertension or peripheral artery disease:  no At any time, has a partner hit, kicked or otherwise hurt or frightened you?: no Over the past 2 weeks, have you felt down, depressed or hopeless?: no Over the past 2 weeks, have you felt little interest or pleasure in doing things?:no   Gynecologic History Patient's last menstrual period was 06/25/2014. Contraception: OCP (estrogen/progesterone) Last Pap: 2015. Results were: normal Last mammogram: n/a. Results were: n/a  Obstetric History OB History  Gravida Para Term Preterm AB SAB TAB Ectopic Multiple Living  2 1 1  1  1   1     # Outcome Date GA Lbr Len/2nd Weight Sex Delivery Anes PTL Lv  2 Term 07/22/01 [redacted]w[redacted]d  7 lb (3.175 kg) F CS-LTranv EPI N Y  1 TAB               Past Medical History  Diagnosis Date  . Irregular menstrual cycle   . Genital herpes, unspecified   . Dyspareunia   . Essential hypertension, benign   . Surveillance of previously prescribed contraceptive pill     Past Surgical History  Procedure Laterality Date  . Breast reduction surgery    . Cesarean section       Current outpatient prescriptions:  .  hydrochlorothiazide (HYDRODIURIL) 25 MG tablet, Take 1 tablet (25 mg total) by mouth daily., Disp: 30 tablet, Rfl: 11 .  Norgestimate-Ethinyl Estradiol Triphasic  0.18/0.215/0.25 MG-25 MCG tab, Take 1 tablet by mouth daily., Disp: 28 tablet, Rfl: 11 .  valACYclovir (VALTREX) 500 MG tablet, Take 1 tablet (500 mg total) by mouth daily., Disp: 30 tablet, Rfl: 11 .  clotrimazole (GYNE-LOTRIMIN 3) 2 % vaginal cream, Apply externally as needed for itching. (Patient not taking: Reported on 07/14/2014), Disp: 21 g, Rfl: 2 .  fluconazole (DIFLUCAN) 150 MG tablet, Take 1 tablet (150 mg total) by mouth once., Disp: 1 tablet, Rfl: 2 .  ibuprofen (ADVIL,MOTRIN) 800 MG tablet, Take 1 tablet (800 mg total) by mouth every 8 (eight) hours as needed., Disp: 30 tablet, Rfl: 5 .  metroNIDAZOLE (METROGEL VAGINAL) 0.75 % vaginal gel, Place 1 Applicatorful vaginally 2 (two) times daily., Disp: 70 g, Rfl: 2 .  tinidazole (TINDAMAX) 500 MG tablet, Take 4 tablets (2,000 mg total) by mouth daily with breakfast. (Patient not taking: Reported on 07/14/2014), Disp: 8 tablet, Rfl: 0 Allergies  Allergen Reactions  . Sulfa Antibiotics Shortness Of Breath    History  Substance Use Topics  . Smoking status: Never Smoker   . Smokeless tobacco: Never Used  . Alcohol Use: 0.0 oz/week    0 Standard drinks or equivalent per week     Comment: occas.    Family History  Problem Relation Age of Onset  . Diabetes Mother   . Hyperlipidemia Mother   . Arthritis Mother   . Diabetes Sister   .  Diabetes Maternal Grandmother   . Arthritis Paternal Grandmother   . Congenital heart disease        Review of Systems  Constitutional: negative for fatigue and weight loss Respiratory: negative for cough and wheezing Cardiovascular: negative for chest pain, fatigue and palpitations Gastrointestinal: negative for abdominal pain and change in bowel habits Musculoskeletal:negative for myalgias Neurological: negative for gait problems and tremors Behavioral/Psych: negative for abusive relationship, depression Endocrine: negative for temperature intolerance   Genitourinary:negative for abnormal  menstrual periods, genital lesions, hot flashes, sexual problems and vaginal discharge Integument/breast: negative for breast lump, breast tenderness, nipple discharge and skin lesion(s)    Objective:       BP 125/90 mmHg  Pulse 70  Temp(Src) 99.1 F (37.3 C)  Ht 5\' 3"  (1.6 m)  Wt 171 lb (77.565 kg)  BMI 30.30 kg/m2  LMP 06/25/2014 General:   alert  Skin:   no rash or abnormalities  Lungs:   clear to auscultation bilaterally  Heart:   regular rate and rhythm, S1, S2 normal, no murmur, click, rub or gallop  Breasts:   normal without suspicious masses, skin or nipple changes or axillary nodes  Abdomen:  normal findings: no organomegaly, soft, non-tender and no hernia  Pelvis:  External genitalia: normal general appearance Urinary system: urethral meatus normal and bladder without fullness, nontender Vaginal: normal without tenderness, induration or masses Cervix: normal appearance Adnexa: normal bimanual exam Uterus: anteverted and non-tender, normal size   Lab Review Urine pregnancy test Labs reviewed yes Radiologic studies reviewed no    Assessment:    Healthy female exam.    Contraceptive management  HTN.  Stable.  H/O Genital Herpes.  Stable.   Plan:   Continue OCP's Continue HCTZ Continue Valtrex   Education reviewed: low fat, low cholesterol diet, safe sex/STD prevention, self breast exams and weight bearing exercise. Contraception: OCP (estrogen/progesterone). Follow up in: 1 year.   Meds ordered this encounter  Medications  . hydrochlorothiazide (HYDRODIURIL) 25 MG tablet    Sig: Take 1 tablet (25 mg total) by mouth daily.    Dispense:  30 tablet    Refill:  11  . Norgestimate-Ethinyl Estradiol Triphasic 0.18/0.215/0.25 MG-25 MCG tab    Sig: Take 1 tablet by mouth daily.    Dispense:  28 tablet    Refill:  11  . valACYclovir (VALTREX) 500 MG tablet    Sig: Take 1 tablet (500 mg total) by mouth daily.    Dispense:  30 tablet    Refill:  11  .  fluconazole (DIFLUCAN) 150 MG tablet    Sig: Take 1 tablet (150 mg total) by mouth once.    Dispense:  1 tablet    Refill:  2  . metroNIDAZOLE (METROGEL VAGINAL) 0.75 % vaginal gel    Sig: Place 1 Applicatorful vaginally 2 (two) times daily.    Dispense:  70 g    Refill:  2  . ibuprofen (ADVIL,MOTRIN) 800 MG tablet    Sig: Take 1 tablet (800 mg total) by mouth every 8 (eight) hours as needed.    Dispense:  30 tablet    Refill:  5   Orders Placed This Encounter  Procedures  . HIV antibody  . Hepatitis B surface antigen  . RPR  . Hepatitis C antibody

## 2014-07-14 NOTE — Addendum Note (Signed)
Addended by: Lewie Loron D on: 07/14/2014 11:42 AM   Modules accepted: Orders

## 2014-07-15 LAB — HEPATITIS B SURFACE ANTIGEN: HEP B S AG: NEGATIVE

## 2014-07-15 LAB — HEPATITIS C ANTIBODY: HCV AB: NEGATIVE

## 2014-07-15 LAB — RPR

## 2014-07-15 LAB — HIV ANTIBODY (ROUTINE TESTING W REFLEX): HIV 1&2 Ab, 4th Generation: NONREACTIVE

## 2014-07-15 LAB — PAP IG AND HPV HIGH-RISK: HPV DNA HIGH RISK: NOT DETECTED

## 2014-07-16 LAB — SURESWAB, VAGINOSIS/VAGINITIS PLUS
Atopobium vaginae: NOT DETECTED Log (cells/mL)
C. albicans, DNA: NOT DETECTED
C. glabrata, DNA: NOT DETECTED
C. parapsilosis, DNA: NOT DETECTED
C. trachomatis RNA, TMA: NOT DETECTED
C. tropicalis, DNA: NOT DETECTED
Gardnerella vaginalis: NOT DETECTED Log (cells/mL)
LACTOBACILLUS SPECIES: 8 Log (cells/mL)
MEGASPHAERA SPECIES: NOT DETECTED Log (cells/mL)
N. GONORRHOEAE RNA, TMA: NOT DETECTED
T. VAGINALIS RNA, QL TMA: NOT DETECTED

## 2014-09-10 ENCOUNTER — Other Ambulatory Visit: Payer: Self-pay | Admitting: Obstetrics

## 2014-09-23 ENCOUNTER — Ambulatory Visit (INDEPENDENT_AMBULATORY_CARE_PROVIDER_SITE_OTHER): Payer: 59 | Admitting: Family Medicine

## 2014-09-23 VITALS — BP 128/80 | HR 84 | Temp 99.4°F | Resp 20 | Ht 63.0 in | Wt 171.5 lb

## 2014-09-23 DIAGNOSIS — B3731 Acute candidiasis of vulva and vagina: Secondary | ICD-10-CM

## 2014-09-23 DIAGNOSIS — N898 Other specified noninflammatory disorders of vagina: Secondary | ICD-10-CM | POA: Diagnosis not present

## 2014-09-23 DIAGNOSIS — B373 Candidiasis of vulva and vagina: Secondary | ICD-10-CM | POA: Diagnosis not present

## 2014-09-23 DIAGNOSIS — Z113 Encounter for screening for infections with a predominantly sexual mode of transmission: Secondary | ICD-10-CM | POA: Diagnosis not present

## 2014-09-23 DIAGNOSIS — L298 Other pruritus: Secondary | ICD-10-CM

## 2014-09-23 LAB — POCT UA - MICROSCOPIC ONLY
Casts, Ur, LPF, POC: NEGATIVE
Crystals, Ur, HPF, POC: NEGATIVE
Mucus, UA: NEGATIVE
Yeast, UA: NEGATIVE

## 2014-09-23 LAB — POCT WET PREP WITH KOH
KOH Prep POC: POSITIVE
Trichomonas, UA: NEGATIVE
Yeast Wet Prep HPF POC: POSITIVE

## 2014-09-23 LAB — POCT URINALYSIS DIPSTICK
Bilirubin, UA: NEGATIVE
Blood, UA: NEGATIVE
Glucose, UA: NEGATIVE
Ketones, UA: NEGATIVE
Leukocytes, UA: NEGATIVE
Nitrite, UA: NEGATIVE
Protein, UA: NEGATIVE
Spec Grav, UA: 1.015
Urobilinogen, UA: 0.2
pH, UA: 7

## 2014-09-23 MED ORDER — FLUCONAZOLE 150 MG PO TABS: 150.0000 mg | ORAL_TABLET | Freq: Once | ORAL | Status: DC

## 2014-09-23 NOTE — Patient Instructions (Signed)

## 2014-09-23 NOTE — Progress Notes (Signed)
Chief Complaint:  Chief Complaint  Patient presents with  . Vaginal Itching    HPI: Amanda Cox is a 39 y.o. female who reports to [facility] today complaining of  1 week hx of vaginal itching and discharge. LMP Thursday , she gets BV fairly regular about 1 x every 3 months. Like the flagyl gel more than the oral pills, makes her nauseated She has had a hx of chlamydia but  recent pap with STD testing normal One partner. Also gets UTIs regualr as well. She has tried [employer] and also diflucan to see if sxs are relieved.  Deneis f/c/n/v/abd or pelvic or back pain.   Past Medical History  Diagnosis Date  . Irregular menstrual cycle   . Genital herpes, unspecified   . Dyspareunia   . Essential hypertension, benign   . Surveillance of previously prescribed contraceptive pill    Past Surgical History  Procedure Laterality Date  . Breast reduction surgery    . Cesarean section     History   Social History  . Marital Status: Single    Spouse Name: N/A  . Number of Children: 1  . Years of Education: College   Occupational History  .  [employer]   Social History Main Topics  . Smoking status: Never Smoker   . Smokeless tobacco: Never Used  . Alcohol Use: 0.0 oz/week    0 Standard drinks or equivalent per week     Comment: occas.  . Drug Use: No  . Sexual Activity:    Partners: Male    Birth Control/ Protection: OCP   Other Topics Concern  . None   Social History Narrative   Patient is single and lives at home, her daughter lives with her.   Patient has a college education.   Patient is right-handed.   Patient drinks sodas three times per month.   Patient has one child.   Family History  Problem Relation Age of Onset  . Diabetes Mother   . Hyperlipidemia Mother   . Arthritis Mother   . Diabetes Sister   . Diabetes Maternal Grandmother   . Arthritis Paternal Grandmother   . Congenital heart disease     Allergies  Allergen Reactions  . Sulfa  Antibiotics Shortness Of Breath   Prior to Admission medications   Medication Sig Start Date End Date Taking? Authorizing Provider  fluconazole (DIFLUCAN) 150 MG tablet Take 1 tablet (150 mg total) by mouth once. 07/14/14  Yes [name], MD  hydrochlorothiazide (HYDRODIURIL) 25 MG tablet Take 1 tablet (25 mg total) by mouth daily. 07/14/14  Yes [name], MD  ibuprofen (ADVIL,MOTRIN) 800 MG tablet Take 1 tablet (800 mg total) by mouth every 8 (eight) hours as needed. 07/14/14  Yes [name], MD  metroNIDAZOLE (METROGEL VAGINAL) 0.75 % vaginal gel Place 1 Applicatorful vaginally 2 (two) times daily. 07/14/14  Yes [name], MD  Norgestimate-Ethinyl Estradiol Triphasic 0.18/0.215/0.25 MG-25 MCG tab Take 1 tablet by mouth daily. 07/14/14  Yes [name], MD  valACYclovir (VALTREX) 500 MG tablet Take 1 tablet (500 mg total) by mouth daily. 07/14/14  Yes [name], MD     ROS: The patient denies fevers, chills, night sweats, unintentional weight loss, chest pain, palpitations, wheezing, dyspnea on exertion, nausea, vomiting, abdominal pain, dysuria, hematuria, melena, numbness, weakness, or tingling.   All other systems have been reviewed and were otherwise negative with the exception of those mentioned in  the HPI and as above.    PHYSICAL EXAM: Filed Vitals:   09/23/14 1833  BP: 128/80  Pulse: 84  Temp: 99.4 F (37.4 C)  Resp: 20   Body mass index is 30.39 kg/(m^2).   General: Alert, no acute distress HEENT:  Normocephalic, atraumatic, oropharynx patent. EOMI, PERRLA Cardiovascular:  Regular rate and rhythm, no rubs murmurs or gallops.   Respiratory: Clear to auscultation bilaterally.  No wheezes, rales, or rhonchi.  No cyanosis, no use of accessory musculature Abdominal: No organomegaly, abdomen is soft and non-tender, positive bowel sounds. No masses. Skin: No rashes. Neurologic: Facial musculature symmetric. Psychiatric: Patient acts appropriately  throughout our interaction. Lymphatic: No cervical or submandibular lymphadenopathy Musculoskeletal: Gait intact.  + vaginal dc with bits and pieces of blood, no CMT, no rashes or masses  LABS: Results for orders placed or performed in visit on 09/23/14  POCT urinalysis dipstick  Result Value Ref Range   Color, UA yellow    Clarity, UA clear    Glucose, UA neg    Bilirubin, UA neg    Ketones, UA neg    Spec Grav, UA 1.015    Blood, UA neg    pH, UA 7.0    Protein, UA neg    Urobilinogen, UA 0.2    Nitrite, UA neg    Leukocytes, UA Negative Negative  POCT UA - Microscopic Only  Result Value Ref Range   WBC, Ur, HPF, POC 0-1    RBC, urine, microscopic 0-2    Bacteria, U Microscopic trace    Mucus, UA neg    Epithelial cells, urine per micros 0-1    Crystals, Ur, HPF, POC neg    Casts, Ur, LPF, POC neg    Yeast, UA neg   POCT Wet Prep with KOH  Result Value Ref Range   Trichomonas, UA Negative    Clue Cells Wet Prep HPF POC 0-2    Epithelial Wet Prep HPF POC Moderate Few, Moderate, Many   Yeast Wet Prep HPF POC pos    Bacteria Wet Prep HPF POC Moderate (A) None, Few   RBC Wet Prep HPF POC 0-2    WBC Wet Prep HPF POC 4-6    KOH Prep POC Positive      EKG/XRAY:   Primary read interpreted by Dr. Marin Comment at [facility].   ASSESSMENT/PLAN: Encounter Diagnoses  Name Primary?  . Vaginal discharge Yes  . Vaginal itching   . Screening for STD (sexually transmitted disease)    Rx Diflucan, labs pending If sxs do not improve then cnsider metronidazole cream supp Fu prn   Gross sideeffects, risk and benefits, and alternatives of medications d/w patient. Patient is aware that all medications have potential sideeffects and we are unable to predict every sideeffect or drug-drug interaction that may occur.  Thao Le DO  09/23/2014 8:36 PM

## 2014-09-25 LAB — GC/CHLAMYDIA PROBE AMP
CT Probe RNA: NEGATIVE
GC Probe RNA: NEGATIVE

## 2014-09-26 ENCOUNTER — Telehealth: Payer: Self-pay

## 2014-09-26 MED ORDER — METRONIDAZOLE 500 MG PO TABS: 500.0000 mg | ORAL_TABLET | Freq: Two times a day (BID) | ORAL | Status: DC

## 2014-09-26 NOTE — Telephone Encounter (Signed)
Patient called in stating that she was seen in the clinic the other day and her provider told her to call back in if she was not any better in a few days and she would prescribe her a different medication. She states she is the same and no better and would like a different medication.  [facility] [address]

## 2014-09-26 NOTE — Telephone Encounter (Signed)
Dw patient labs and sxs. Patient has not taken 2nd dose of diflucan, she will try it. IF no improvement then I will give her flagyl 500 mg BID x 7 days, she cannot afford the flagyl gel at this time.

## 2014-09-26 NOTE — Telephone Encounter (Signed)
Called patient to get details. She states she has not had any improvement from when she was seen 09/23/14. She has taken 2 diflucan and still no change. She is still having itching and dysuria. Plan stated if sxs do not improve then consider metronidazole cream supp. Please advise?

## 2014-09-30 ENCOUNTER — Encounter: Payer: Self-pay | Admitting: Obstetrics

## 2014-09-30 ENCOUNTER — Ambulatory Visit (INDEPENDENT_AMBULATORY_CARE_PROVIDER_SITE_OTHER): Payer: 59 | Admitting: Obstetrics

## 2014-09-30 VITALS — BP 121/81 | HR 75 | Temp 98.6°F | Wt 173.0 lb

## 2014-09-30 DIAGNOSIS — B373 Candidiasis of vulva and vagina: Secondary | ICD-10-CM | POA: Diagnosis not present

## 2014-09-30 DIAGNOSIS — N760 Acute vaginitis: Secondary | ICD-10-CM

## 2014-09-30 DIAGNOSIS — A499 Bacterial infection, unspecified: Secondary | ICD-10-CM | POA: Diagnosis not present

## 2014-09-30 DIAGNOSIS — B9689 Other specified bacterial agents as the cause of diseases classified elsewhere: Secondary | ICD-10-CM

## 2014-09-30 DIAGNOSIS — B3731 Acute candidiasis of vulva and vagina: Secondary | ICD-10-CM

## 2014-09-30 NOTE — Progress Notes (Signed)
Patient ID: Amanda Cox, female   DOB: [birth year], 39 y.o.   MRN: [id number]  Chief Complaint  Patient presents with  . Vaginitis    vaginal itch/irritation    HPI Amanda Cox is a 39 y.o. female.  H/O recurrent BV.  Presents for F/U after recent treatment.  Still has malodorous vaginal discharge and irritation.  HPI  Past Medical History  Diagnosis Date  . Irregular menstrual cycle   . Genital herpes, unspecified   . Dyspareunia   . Essential hypertension, benign   . Surveillance of previously prescribed contraceptive pill     Past Surgical History  Procedure Laterality Date  . Breast reduction surgery    . Cesarean section      Family History  Problem Relation Age of Onset  . Diabetes Mother   . Hyperlipidemia Mother   . Arthritis Mother   . Diabetes Sister   . Diabetes Maternal Grandmother   . Arthritis Paternal Grandmother   . Congenital heart disease      Social History Social History  Substance Use Topics  . Smoking status: Never Smoker   . Smokeless tobacco: Never Used  . Alcohol Use: 0.0 oz/week    0 Standard drinks or equivalent per week     Comment: occas.    Allergies  Allergen Reactions  . Sulfa Antibiotics Shortness Of Breath    Current Outpatient Prescriptions  Medication Sig Dispense Refill  . hydrochlorothiazide (HYDRODIURIL) 25 MG tablet Take 1 tablet (25 mg total) by mouth daily. 30 tablet 11  . Norgestimate-Ethinyl Estradiol Triphasic 0.18/0.215/0.25 MG-25 MCG tab Take 1 tablet by mouth daily. 28 tablet 11  . valACYclovir (VALTREX) 500 MG tablet Take 1 tablet (500 mg total) by mouth daily. 30 tablet 11  . fluconazole (DIFLUCAN) 150 MG tablet Take 1 tablet (150 mg total) by mouth once. May repeat in 3 days (Patient not taking: Reported on 09/30/2014) 2 tablet 0  . ibuprofen (ADVIL,MOTRIN) 800 MG tablet Take 1 tablet (800 mg total) by mouth every 8 (eight) hours as needed. (Patient not taking: Reported on 09/30/2014) 30 tablet 5  .  metroNIDAZOLE (FLAGYL) 500 MG tablet Take 1 tablet (500 mg total) by mouth 2 (two) times daily. No alcohol with this medicine (Patient not taking: Reported on 09/30/2014) 14 tablet 0  . metroNIDAZOLE (METROGEL VAGINAL) 0.75 % vaginal gel Place 1 Applicatorful vaginally 2 (two) times daily. (Patient not taking: Reported on 09/30/2014) 70 g 2   No current facility-administered medications for this visit.    Review of Systems Review of Systems Constitutional: negative for fatigue and weight loss Respiratory: negative for cough and wheezing Cardiovascular: negative for chest pain, fatigue and palpitations Gastrointestinal: negative for abdominal pain and change in bowel habits Genitourinary: malodorous vaginal discharge with irritation Integument/breast: negative for nipple discharge Musculoskeletal:negative for myalgias Neurological: negative for gait problems and tremors Behavioral/Psych: negative for abusive relationship, depression Endocrine: negative for temperature intolerance     Blood pressure 121/81, pulse 75, temperature 98.6 F (37 C), weight 173 lb (78.472 kg), last menstrual period 09/17/2014.  Physical Exam Physical Exam:           General: Alert and no distress Abdomen:  normal findings: no organomegaly, soft, non-tender and no hernia  Pelvis:  External genitalia: normal general appearance Urinary system: urethral meatus normal and bladder without fullness, nontender Vaginal: normal without tenderness, induration or masses Cervix: normal appearance Adnexa: normal bimanual exam Uterus: anteverted and non-tender, normal size  Data Reviewed Labs  Assessment     H/O chronic BV    Plan    Clindesse dispensed Diflucan Rx F/U prn  Orders Placed This Encounter  Procedures  . SureSwab Bacterial Vaginosis/itis   No orders of the defined types were placed in this encounter.

## 2014-10-03 LAB — SURESWAB BACTERIAL VAGINOSIS/ITIS
Atopobium vaginae: NOT DETECTED Log (cells/mL)
C. albicans, DNA: DETECTED — AB
C. glabrata, DNA: NOT DETECTED
C. parapsilosis, DNA: NOT DETECTED
C. tropicalis, DNA: NOT DETECTED
GARDNERELLA VAGINALIS: NOT DETECTED Log (cells/mL)
LACTOBACILLUS SPECIES: 7.4 Log (cells/mL)
MEGASPHAERA SPECIES: NOT DETECTED Log (cells/mL)
T. vaginalis RNA, QL TMA: NOT DETECTED

## 2014-10-08 ENCOUNTER — Telehealth: Payer: Self-pay | Admitting: *Deleted

## 2014-10-08 ENCOUNTER — Other Ambulatory Visit: Payer: Self-pay | Admitting: *Deleted

## 2014-10-08 DIAGNOSIS — B379 Candidiasis, unspecified: Secondary | ICD-10-CM

## 2014-10-08 MED ORDER — TERCONAZOLE 0.4 % VA CREA: 1.0000 | TOPICAL_CREAM | Freq: Every day | VAGINAL | Status: DC

## 2014-10-08 NOTE — Telephone Encounter (Signed)
Patient contacted the office requesting lab results. Patient advised results showed yeast. Patient states she is still having some itching and had taken Diflucan prior her appointment. Per nursing protocol prescription for Terazol sent to the pharmacy.

## 2014-11-09 ENCOUNTER — Telehealth: Payer: Self-pay | Admitting: *Deleted

## 2014-11-09 ENCOUNTER — Encounter: Payer: Self-pay | Admitting: *Deleted

## 2014-11-09 NOTE — Telephone Encounter (Signed)
Placed call to pt to discuss prescription coverage and PA process. Made pt aware that an appeal letter has to be sent in in order to try and approve her Valtrex (brand name) Rx.  Pt was made aware that this will be done today. Pt also made aware that Metrogel Rx is on tier 1 with her insurance and is covered.  Pt made aware that she may have higher prescription deductible and unless it has been met she may have a higher copay. Pt made aware she will be contacted once approval information has been received regarding Valtrex.  Pt advised to contact me if she has any other concerns. Pt states understanding.

## 2014-12-25 ENCOUNTER — Other Ambulatory Visit: Payer: Self-pay | Admitting: Obstetrics

## 2014-12-30 ENCOUNTER — Encounter: Payer: Self-pay | Admitting: *Deleted

## 2015-03-19 ENCOUNTER — Other Ambulatory Visit: Payer: Self-pay | Admitting: Obstetrics

## 2015-03-27 ENCOUNTER — Ambulatory Visit (INDEPENDENT_AMBULATORY_CARE_PROVIDER_SITE_OTHER): Payer: 59 | Admitting: Family Medicine

## 2015-03-27 VITALS — BP 122/78 | HR 80 | Temp 98.4°F | Resp 16 | Ht 63.0 in | Wt 178.6 lb

## 2015-03-27 DIAGNOSIS — R609 Edema, unspecified: Secondary | ICD-10-CM

## 2015-03-27 DIAGNOSIS — R600 Localized edema: Secondary | ICD-10-CM

## 2015-03-27 LAB — COMPLETE METABOLIC PANEL WITH GFR
ALT: 17 U/L (ref 6–29)
AST: 15 U/L (ref 10–30)
Albumin: 3.6 g/dL (ref 3.6–5.1)
Alkaline Phosphatase: 38 U/L (ref 33–115)
BUN: 14 mg/dL (ref 7–25)
CO2: 27 mmol/L (ref 20–31)
Calcium: 8.4 mg/dL — ABNORMAL LOW (ref 8.6–10.2)
Chloride: 105 mmol/L (ref 98–110)
Creat: 0.64 mg/dL (ref 0.50–1.10)
GFR, Est African American: >89 mL/min (ref >=60)
GFR, Est Non African American: >89 mL/min (ref >=60)
Glucose, Bld: 97 mg/dL (ref 65–99)
Potassium: 4.2 mmol/L (ref 3.5–5.3)
Sodium: 140 mmol/L (ref 135–146)
Total Bilirubin: 0.3 mg/dL (ref 0.2–1.2)
Total Protein: 6 g/dL — ABNORMAL LOW (ref 6.1–8.1)

## 2015-03-27 LAB — TSH: TSH: 0.811 u[IU]/mL (ref 0.350–4.500)

## 2015-03-27 LAB — POCT CBC
Granulocyte percent: 61 %G (ref 37–80)
HCT, POC: 36.5 % — AB (ref 37.7–47.9)
Hemoglobin: 12.4 g/dL (ref 12.2–16.2)
Lymph, poc: 2.4 (ref 0.6–3.4)
MCH, POC: 29.6 pg (ref 27–31.2)
MCHC: 34 g/dL (ref 31.8–35.4)
MCV: 86.9 fL (ref 80–97)
MID (cbc): 0.2 (ref 0–0.9)
MPV: 6.7 fL (ref 0–99.8)
POC Granulocyte: 4.1 (ref 2–6.9)
POC LYMPH PERCENT: 35.5 %L (ref 10–50)
POC MID %: 3.5 %M (ref 0–12)
Platelet Count, POC: 289 10*3/uL (ref 142–424)
RBC: 4.2 M/uL (ref 4.04–5.48)
RDW, POC: 13.2 %
WBC: 6.8 10*3/uL (ref 4.6–10.2)

## 2015-03-27 MED ORDER — FUROSEMIDE 20 MG PO TABS: 20.0000 mg | ORAL_TABLET | Freq: Every day | ORAL | Status: DC | PRN

## 2015-03-27 NOTE — Progress Notes (Signed)
By signing my name below, I, Rawaa Al Rifaie, attest that this documentation has been prepared under the direction and in the presence of Robyn Haber, Nettie, Medical Scribe. 03/27/2015.  10:47 AM.  Patient ID: Amanda Cox MRN: [id number], DOB: [birth year], 40 y.o. Date of Encounter: 03/27/2015  Primary Physician: No primary care provider on file.  Chief Complaint:  Chief Complaint  Patient presents with  . Leg Swelling    over 1 week  . Tingling    HPI:  Amanda Cox is a 40 y.o. female who presents to [facility] complaining of BL leg and feet swelling for over a week now.  Pt reports that she has a similar history of the same symptoms last year that was resolved. Pt is compliant with taking HCTZ 25 mg, and her birth control pill. She has a family history of HTN, DM, HLD. Pt states that she was exercising for a long period of time, and when she stopped, she gained about 8 lbs in 3 days. She denies pain of the area, arthralgia, rashes, sores on the mouth. She reports no previous kidney complications.  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    Past Medical History  Diagnosis Date  . Irregular menstrual cycle   . Genital herpes, unspecified   . Dyspareunia   . Essential hypertension, benign   . Surveillance of previously prescribed contraceptive pill      Home Meds: Prior to Admission medications   Medication Sig Start Date End Date Taking? Authorizing Provider  hydrochlorothiazide (HYDRODIURIL) 25 MG tablet Take 1 tablet (25 mg total) by mouth daily. 07/14/14  Yes [name], MD  Norgestimate-Ethinyl Estradiol Triphasic 0.18/0.215/0.25 MG-25 MCG tab Take 1 tablet by mouth daily. 07/14/14  Yes [name], MD  terconazole (TERAZOL 7) 0.4 % vaginal cream Place 1 applicator vaginally at bedtime. 10/08/14  Yes [name], MD  valACYclovir (VALTREX) 500 MG tablet Take 1 tablet (500 mg total) by mouth daily.  07/14/14  Yes [name], MD  VALTREX 500 MG tablet TAKE 1 TABLET (500 MG TOTAL) BY MOUTH DAILY. 12/26/14  Yes [name], MD  fluconazole (DIFLUCAN) 150 MG tablet TAKE 1 TABLET BY MOUTH ONCE Patient not taking: Reported on 03/27/2015 03/19/15   [name], MD  ibuprofen (ADVIL,MOTRIN) 800 MG tablet Take 1 tablet (800 mg total) by mouth every 8 (eight) hours as needed. Patient not taking: Reported on 09/30/2014 07/14/14   [name], MD  metroNIDAZOLE (FLAGYL) 500 MG tablet Take 1 tablet (500 mg total) by mouth 2 (two) times daily. No alcohol with this medicine Patient not taking: Reported on 09/30/2014 09/26/14   Thao P Le, DO  metroNIDAZOLE (METROGEL VAGINAL) 0.75 % vaginal gel Place 1 Applicatorful vaginally 2 (two) times daily. Patient not taking: Reported on 09/30/2014 07/14/14   [name], MD    Allergies:  Allergies  Allergen Reactions  . Sulfa Antibiotics Shortness Of Breath    Social History   Social History  . Marital Status: Single    Spouse Name: N/A  . Number of Children: 1  . Years of Education: College   Occupational History  .  [employer]   Social History Main Topics  . Smoking status: Never Smoker   . Smokeless tobacco: Never Used  . Alcohol Use: 0.0 oz/week    0 Standard drinks or equivalent per week     Comment: occas.  Marland Kitchen  Drug Use: No  . Sexual Activity:    Partners: Male    Birth Control/ Protection: OCP   Other Topics Concern  . Not on file   Social History Narrative   Patient is single and lives at home, her daughter lives with her.   Patient has a college education.   Patient is right-handed.   Patient drinks sodas three times per month.   Patient has one child.     Review of Systems: Constitutional: negative for chills, fever, night sweats, weight changes, or fatigue  HEENT: negative for vision changes, hearing loss, congestion, rhinorrhea, ST, epistaxis, or sinus pressure Cardiovascular: negative for chest pain  or palpitations. Positive for leg swelling. Respiratory: negative for hemoptysis, wheezing, shortness of breath, or cough Abdominal: negative for abdominal pain, nausea, vomiting, diarrhea, or constipation Dermatological: negative for rash Neurologic: negative for headache, dizziness, or syncope [id number] for arthralgia All other systems reviewed and are otherwise negative with the exception to those above and in the HPI.  Physical Exam: Blood pressure 122/78, pulse 80, temperature 98.4 F (36.9 C), temperature source Oral, resp. rate 16, height 5\' 3"  (1.6 m), weight 178 lb 9.6 oz (81.012 kg), last menstrual period 03/11/2015., Body mass index is 31.65 kg/(m^2). General: Well developed, well nourished, in no acute distress. Head: Normocephalic, atraumatic, eyes without discharge, sclera non-icteric, nares are without discharge. Bilateral auditory canals clear, TM's are without perforation, pearly grey and translucent with reflective cone of light bilaterally. Oral cavity moist, posterior pharynx without exudate, erythema, peritonsillar abscess, or post nasal drip.  Neck: Supple. No thyromegaly. Full ROM. No lymphadenopathy. Lungs: Clear bilaterally to auscultation without wheezes, rales, or rhonchi. Breathing is unlabored. Heart: RRR with S1 S2. No murmurs, rubs, or gallops appreciated.  Msk:  Strength and tone normal for age. 1+ pitting edema in both legs.  Extremities/Skin: Warm and dry. No clubbing or cyanosis. No edema. No rashes or suspicious lesions. Neuro: Alert and oriented X 3. Moves all extremities spontaneously. Gait is normal. CNII-XII grossly in tact. Psych:  Responds to questions appropriately with a normal affect.    ASSESSMENT AND PLAN:  40 y.o. year old female with mild pretibial edema bilaterally without skin changes. This chart was scribed in my presence and reviewed by me personally.    ICD-9-CM ICD-10-CM   1. Edema extremities 782.3 R60.9 POCT CBC     COMPLETE  METABOLIC PANEL WITH GFR     TSH     furosemide (LASIX) 20 MG tablet     Signed, Robyn Haber, MD  Signed, Robyn Haber, MD 03/27/2015 10:42 AM

## 2015-03-27 NOTE — Patient Instructions (Signed)
Edema °Edema is an abnormal buildup of fluids in your body tissues. Edema is somewhat dependent on gravity to pull the fluid to the lowest place in your body. That makes the condition more common in the legs and thighs (lower extremities). Painless swelling of the feet and ankles is common and becomes more likely as you get older. It is also common in looser tissues, like around your eyes.  °When the affected area is squeezed, the fluid may move out of that spot and leave a dent for a few moments. This dent is called pitting.  °CAUSES  °There are many possible causes of edema. Eating too much salt and being on your feet or sitting for a long time can cause edema in your legs and ankles. Hot weather may make edema worse. Common medical causes of edema include: °· Heart failure. °· Liver disease. °· Kidney disease. °· Weak blood vessels in your legs. °· Cancer. °· An injury. °· Pregnancy. °· Some medications. °· Obesity.  °SYMPTOMS  °Edema is usually painless. Your skin may look swollen or shiny.  °DIAGNOSIS  °Your health care provider may be able to diagnose edema by asking about your medical history and doing a physical exam. You may need to have tests such as X-rays, an electrocardiogram, or blood tests to check for medical conditions that may cause edema.  °TREATMENT  °Edema treatment depends on the cause. If you have heart, liver, or kidney disease, you need the treatment appropriate for these conditions. General treatment may include: °· Elevation of the affected body part above the level of your heart. °· Compression of the affected body part. Pressure from elastic bandages or support stockings squeezes the tissues and forces fluid back into the blood vessels. This keeps fluid from entering the tissues. °· Restriction of fluid and salt intake. °· Use of a water pill (diuretic). These medications are appropriate only for some types of edema. They pull fluid out of your body and make you urinate more often. This  gets rid of fluid and reduces swelling, but diuretics can have side effects. Only use diuretics as directed by your health care provider. °HOME CARE INSTRUCTIONS  °· Keep the affected body part above the level of your heart when you are lying down.   °· Do not sit still or stand for prolonged periods.   °· Do not put anything directly under your knees when lying down. °· Do not wear constricting clothing or garters on your upper legs.   °· Exercise your legs to work the fluid back into your blood vessels. This may help the swelling go down.   °· Wear elastic bandages or support stockings to reduce ankle swelling as directed by your health care provider.   °· Eat a low-salt diet to reduce fluid if your health care provider recommends it.   °· Only take medicines as directed by your health care provider.  °SEEK MEDICAL CARE IF:  °· Your edema is not responding to treatment. °· You have heart, liver, or kidney disease and notice symptoms of edema. °· You have edema in your legs that does not improve after elevating them.   °· You have sudden and unexplained weight gain. °SEEK IMMEDIATE MEDICAL CARE IF:  °· You develop shortness of breath or chest pain.   °· You cannot breathe when you lie down. °· You develop pain, redness, or warmth in the swollen areas.   °· You have heart, liver, or kidney disease and suddenly get edema. °· You have a fever and your symptoms suddenly get worse. °MAKE SURE YOU:  °·   Understand these instructions. °· Will watch your condition. °· Will get help right away if you are not doing well or get worse. °  °This information is not intended to replace advice given to you by your health care provider. Make sure you discuss any questions you have with your health care provider. °  °Document Released: 02/06/2005 Document Revised: 02/27/2014 Document Reviewed: 11/29/2012 °Elsevier Interactive Patient Education ©2016 [employer] Inc. ° °

## 2015-03-31 ENCOUNTER — Telehealth: Payer: Self-pay

## 2015-03-31 NOTE — Telephone Encounter (Signed)
Pt states she was told to call back if she wasn't getting any better and she isn't. Also would like to know if she can take her HCTZ medication Would like to know about her labs as well. Please call pt at [phone]

## 2015-03-31 NOTE — Telephone Encounter (Signed)
Pt was seen for leg swelling. Normal lab letter was mailed on 2/6.

## 2015-05-23 ENCOUNTER — Other Ambulatory Visit: Payer: Self-pay | Admitting: Family Medicine

## 2015-05-25 NOTE — Telephone Encounter (Signed)
Do you want to give RFs? 

## 2015-06-16 ENCOUNTER — Encounter (HOSPITAL_COMMUNITY): Payer: Self-pay | Admitting: Emergency Medicine

## 2015-06-16 ENCOUNTER — Ambulatory Visit (HOSPITAL_COMMUNITY)
Admission: EM | Admit: 2015-06-16 | Discharge: 2015-06-16 | Disposition: A | Discharge status: Home / Self Care | Payer: 59 | Attending: Emergency Medicine | Admitting: Emergency Medicine

## 2015-06-16 DIAGNOSIS — L03317 Cellulitis of buttock: Secondary | ICD-10-CM | POA: Insufficient documentation

## 2015-06-16 DIAGNOSIS — L739 Follicular disorder, unspecified: Secondary | ICD-10-CM | POA: Diagnosis not present

## 2015-06-16 DIAGNOSIS — Z882 Allergy status to sulfonamides status: Secondary | ICD-10-CM | POA: Diagnosis not present

## 2015-06-16 DIAGNOSIS — N760 Acute vaginitis: Secondary | ICD-10-CM | POA: Diagnosis present

## 2015-06-16 MED ORDER — MUPIROCIN 2 % EX OINT: 1.0000 "application " | TOPICAL_OINTMENT | Freq: Three times a day (TID) | CUTANEOUS | Status: DC

## 2015-06-16 MED ORDER — CHLORHEXIDINE GLUCONATE 4 % EX LIQD: Freq: Every day | CUTANEOUS | Status: DC | PRN

## 2015-06-16 MED ORDER — METRONIDAZOLE 500 MG PO TABS: 500.0000 mg | ORAL_TABLET | Freq: Two times a day (BID) | ORAL | Status: DC

## 2015-06-16 MED ORDER — CEPHALEXIN 500 MG PO CAPS
500.0000 mg | ORAL_CAPSULE | Freq: Four times a day (QID) | ORAL | Status: DC
Start: 2015-06-16 — End: 2015-08-14

## 2015-06-16 MED ORDER — FLUCONAZOLE 150 MG PO TABS: 150.0000 mg | ORAL_TABLET | Freq: Once | ORAL | Status: DC

## 2015-06-16 NOTE — ED Provider Notes (Signed)
HPI  SUBJECTIVE:  KYMARI Cox is a 40 y.o. female who presents with painful mass on her left buttock medicine presents for 3 months. States it is increased in size, is now draining for the past 2 days. No nausea, vomiting, fevers. No antipyretic in past 6-8 hours. She also reports other "bumps" in her mons pubis area in the left inguinal region for 6 days. She also reports vaginal itching, nonodorous vaginal discharge.  No urgency, frequency, dysuria, oderous urine, hematuria,  . No aggravating, alleviating factors. Has not tried anything for this. No fevers, N/V, abd pain, back pain. No recent abx use. Pt is in a long-term monogamous sexual relationship with a female partner who is asymptomatic. STD's not a  concern today. atient has past medical history of hypertension, gonorrhea, chlamydia, herpes, BV, Trichomonas, yeast. No history of diabetes,mmunocompromise, MRSA, HIV, syphilis. LMP 3 weeks ago, denies possibility of being pregnant. PMD: [facility].   Past Medical History  Diagnosis Date  . Irregular menstrual cycle   . Genital herpes, unspecified   . Dyspareunia   . Essential hypertension, benign   . Surveillance of previously prescribed contraceptive pill     Past Surgical History  Procedure Laterality Date  . Breast reduction surgery    . Cesarean section      Family History  Problem Relation Age of Onset  . Diabetes Mother   . Hyperlipidemia Mother   . Arthritis Mother   . Diabetes Sister   . Diabetes Maternal Grandmother   . Arthritis Paternal Grandmother   . Congenital heart disease      Social History  Substance Use Topics  . Smoking status: Never Smoker   . Smokeless tobacco: Never Used  . Alcohol Use: 0.0 oz/week    0 Standard drinks or equivalent per week     Comment: occas.    No current facility-administered medications for this encounter.  Current outpatient prescriptions:  .  hydrochlorothiazide (HYDRODIURIL) 25 MG tablet, Take 1 tablet  (25 mg total) by mouth daily., Disp: 30 tablet, Rfl: 11 .  Norgestimate-Ethinyl Estradiol Triphasic 0.18/0.215/0.25 MG-25 MCG tab, Take 1 tablet by mouth daily., Disp: 28 tablet, Rfl: 11 .  cephALEXin (KEFLEX) 500 MG capsule, Take 1 capsule (500 mg total) by mouth 4 (four) times daily. X 10 days, Disp: 40 capsule, Rfl: 0 .  chlorhexidine (HIBICLENS) 4 % external liquid, Apply topically daily as needed. Dilute 10-15 mL in water, Use daily when bathing for 1-2 weeks, Disp: 120 mL, Rfl: 0 .  fluconazole (DIFLUCAN) 150 MG tablet, Take 1 tablet (150 mg total) by mouth once. 1 tab po x 1. May repeat in 72 hours if no improvement, Disp: 2 tablet, Rfl: 1 .  metroNIDAZOLE (FLAGYL) 500 MG tablet, Take 1 tablet (500 mg total) by mouth 2 (two) times daily. X 7 days, Disp: 14 tablet, Rfl: 0 .  mupirocin ointment (BACTROBAN) 2 %, Apply 1 application topically 3 (three) times daily. Apply after warm soak for 10 minutes, Disp: 22 g, Rfl: 0 .  VALTREX 500 MG tablet, TAKE 1 TABLET (500 MG TOTAL) BY MOUTH DAILY., Disp: 30 tablet, Rfl: 4 .  [DISCONTINUED] furosemide (LASIX) 20 MG tablet, TAKE 1 TABLET (20 MG TOTAL) BY MOUTH DAILY AS NEEDED., Disp: 90 tablet, Rfl: 1  Allergies  Allergen Reactions  . Sulfa Antibiotics Shortness Of Breath     ROS  As noted in HPI.   Physical Exam  BP 138/87 mmHg  Pulse 78  Temp(Src) 98.3 F (  36.8 C) (Oral)  Resp 12  SpO2 100%  LMP 05/27/2015  Constitutional: Well developed, well nourished, no acute distress Eyes:  EOMI, conjunctiva normal bilaterally HENT: Normocephalic, atraumatic,mucus membranes moist Respiratory: Normal inspiratory effort Cardiovascular: Normal rate GI: nondistended soft, nontender. No suprapubic tenderness  back: No CVA tenderness GU: External genitalia normal.  Normal vaginal mucosa.  Normal os. thick nonoderous  whitevaginal discharge.   Uterus smooth,  NT. NoCMT. No adnexal tenderness. No adnexal masses.  Chaperone present during exam skin:  tender area, induration measuring approximately 1 x 1 cm on left buttock with a central hole. no central fluctuance. No expressible purulent drainage. Patient also has several bumps in the mons pubis consistent with a folliculitis.  Musculoskeletal: no deformities Neurologic: Alert & oriented x 3, no focal neuro deficits Psychiatric: Speech and behavior appropriate   ED Course   Medications - No data to display  Orders Placed This Encounter  Procedures  . Pelvic exam    Standing Status: Standing     Number of Occurrences: 1     Standing Expiration Date:     No results found for this or any previous visit (from the past 24 hour(s)). No results found.  ED Clinical Impression  Folliculitis  Vaginitis  Cellulitis of buttock   ED Assessment/Plan  patient has a small cellulitis,no abscess on her left buttock, folliculitis on mons pubis. we'll treat this with Keflex. Presentation also consistent with BV versus yeast infection. Favor yeast infection given the itching and thick nonodorous white vaginal discharge.sending home with Diflucan 150 mg 2. However because she gets BV frequently will also send home with Flagyl 500 mg twice a day for 7 days. no evidence of herpes.  Sent off GC/chlamydia, wet prep Will not treat empirically now- feel that the patient is low risk for gonorrhea Chlamydia. Advised pt to refrain from sexual contact until she knows lab results, symptoms resolve, and partner(s) are treated if necessary. Pt provided working phone number. Pt agrees with plan. Discussed MDM, plan and followup with patient . Patient  agrees with plan.  *This clinic note was created using Dragon dictation software. Therefore, there may be occasional mistakes despite careful proofreading.  ?    Melynda Ripple, MD 06/16/15 [id number]

## 2015-06-16 NOTE — Discharge Instructions (Signed)
Take the medication as written. Give us a working phone number so that we can contact you if needed. Refrain from sexual contact until you know your results and your partner(s) are treated. Return if you get worse, have a fever >100.4, or for any concerns.  ° °Go to www.goodrx.com to look up your medications. This will give you a list of where you can find your prescriptions at the most affordable prices.  °

## 2015-06-16 NOTE — ED Notes (Signed)
Pt reports a bump on her left gluteal fold just below her vagina that has been present since January. Area has gotten larger since January and has been painful since Monday. PT reports some smaller surrounding bumps. PT also reports vaginal itching without significant discharge.

## 2015-06-17 LAB — CERVICOVAGINAL ANCILLARY ONLY
Chlamydia: NEGATIVE
Neisseria Gonorrhea: NEGATIVE

## 2015-06-18 LAB — CERVICOVAGINAL ANCILLARY ONLY: Wet Prep (BD Affirm): NEGATIVE

## 2015-06-30 ENCOUNTER — Telehealth (HOSPITAL_COMMUNITY): Payer: Self-pay | Admitting: Emergency Medicine

## 2015-06-30 NOTE — ED Notes (Signed)
Called pt and notified of recent lab results from visit 4/26 Pt ID'd properly... Reports feeling better and sx have subsided  Per Dr. Valere Dross,  Please let patient know that gonorrhea/chlamydia tests were negative. LM  Also, Candida, BV, and trich were neg  Adv pt if sx are not getting better to return  Pt verb understanding

## 2015-07-12 ENCOUNTER — Telehealth: Payer: Self-pay | Admitting: *Deleted

## 2015-07-12 DIAGNOSIS — Z3041 Encounter for surveillance of contraceptive pills: Secondary | ICD-10-CM

## 2015-07-12 DIAGNOSIS — I10 Essential (primary) hypertension: Secondary | ICD-10-CM

## 2015-07-12 MED ORDER — HYDROCHLOROTHIAZIDE 25 MG PO TABS: 25.0000 mg | ORAL_TABLET | Freq: Every day | ORAL | Status: DC

## 2015-07-12 MED ORDER — NORGESTIM-ETH ESTRAD TRIPHASIC 0.18/0.215/0.25 MG-25 MCG PO TABS: 1.0000 | ORAL_TABLET | Freq: Every day | ORAL | Status: DC

## 2015-07-12 NOTE — Telephone Encounter (Signed)
Patient has her annual exam scheduled- but she is going to need a refill before that on her OCP and her HCTZ. Refills sent to her pharmacy for 2 months.

## 2015-07-15 ENCOUNTER — Ambulatory Visit: Payer: 59 | Admitting: Obstetrics

## 2015-08-14 ENCOUNTER — Ambulatory Visit (INDEPENDENT_AMBULATORY_CARE_PROVIDER_SITE_OTHER): Payer: 59 | Admitting: Family Medicine

## 2015-08-14 ENCOUNTER — Ambulatory Visit (INDEPENDENT_AMBULATORY_CARE_PROVIDER_SITE_OTHER): Payer: 59

## 2015-08-14 VITALS — BP 130/90 | HR 73 | Temp 98.4°F | Resp 17 | Ht 64.0 in | Wt 185.0 lb

## 2015-08-14 DIAGNOSIS — S93421A Sprain of deltoid ligament of right ankle, initial encounter: Secondary | ICD-10-CM

## 2015-08-14 MED ORDER — MELOXICAM 15 MG PO TABS: 15.0000 mg | ORAL_TABLET | Freq: Every day | ORAL | Status: DC

## 2015-08-14 NOTE — Patient Instructions (Addendum)
IF you received an x-ray today, you will receive an invoice from [facility]. Please contact [facility] at [phone] with questions or concerns regarding your invoice.   IF you received labwork today, you will receive an invoice from [employer]. Please contact [facility] at [phone] with questions or concerns regarding your invoice.   Our billing staff will not be able to assist you with questions regarding bills from these companies.  You will be contacted with the lab results as soon as they are available. The fastest way to get your results is to activate your My Chart account. Instructions are located on the last page of this paperwork. If you have not heard from [place] regarding the results in 2 weeks, please contact this office.     Acute Ankle Sprain With Phase I Rehab An acute ankle sprain is a partial or complete tear in one or more of the ligaments of the ankle due to traumatic injury. The severity of the injury depends on both the number of ligaments sprained and the grade of sprain. There are 3 grades of sprains.   A grade 1 sprain is a mild sprain. There is a slight pull without obvious tearing. There is no loss of strength, and the muscle and ligament are the correct length.  A grade 2 sprain is a moderate sprain. There is tearing of fibers within the substance of the ligament where it connects two bones or two cartilages. The length of the ligament is increased, and there is usually decreased strength.  A grade 3 sprain is a complete rupture of the ligament and is uncommon. In addition to the grade of sprain, there are three types of ankle sprains.  Lateral ankle sprains: This is a sprain of one or more of the three ligaments on the outer side (lateral) of the ankle. These are the most common sprains. Medial ankle sprains: There is one large triangular ligament of the inner side (medial) of the ankle that is susceptible to  injury. Medial ankle sprains are less common. Syndesmosis, "high ankle," sprains: The syndesmosis is the ligament that connects the two bones of the lower leg. Syndesmosis sprains usually only occur with very severe ankle sprains. SYMPTOMS  Pain, tenderness, and swelling in the ankle, starting at the side of injury that may progress to the whole ankle and foot with time.  "Pop" or tearing sensation at the time of injury.  Bruising that may spread to the heel.  Impaired ability to walk soon after injury. CAUSES   Acute ankle sprains are caused by trauma placed on the ankle that temporarily forces or pries the anklebone (talus) out of its normal socket.  Stretching or tearing of the ligaments that normally hold the joint in place (usually due to a twisting injury). RISK INCREASES WITH:  Previous ankle sprain.  Sports in which the foot may land awkwardly (i.e., basketball, volleyball, or soccer) or walking or running on uneven or rough surfaces.  Shoes with inadequate support to prevent sideways motion when stress occurs.  Poor strength and flexibility.  Poor balance skills.  Contact sports. PREVENTION   Warm up and stretch properly before activity.  Maintain physical fitness:  Ankle and leg flexibility, muscle strength, and endurance.  Cardiovascular fitness.  Balance training activities.  Use proper technique and have a coach correct improper technique.  Taping, protective strapping, bracing, or high-top tennis shoes may help prevent injury. Initially, tape is best; however, it loses most of its  support function within 10 to 15 minutes.  Wear proper-fitted protective shoes (High-top shoes with taping or bracing is more effective than either alone).  Provide the ankle with support during sports and practice activities for 12 months following injury. PROGNOSIS   If treated properly, ankle sprains can be expected to recover completely; however, the length of recovery  depends on the degree of injury.  A grade 1 sprain usually heals enough in 5 to 7 days to allow modified activity and requires an average of 6 weeks to heal completely.  A grade 2 sprain requires 6 to 10 weeks to heal completely.  A grade 3 sprain requires 12 to 16 weeks to heal.  A syndesmosis sprain often takes more than 3 months to heal. RELATED COMPLICATIONS   Frequent recurrence of symptoms may result in a chronic problem. Appropriately addressing the problem the first time decreases the frequency of recurrence and optimizes healing time. Severity of the initial sprain does not predict the likelihood of later instability.  Injury to other structures (bone, cartilage, or tendon).  A chronically unstable or arthritic ankle joint is a possibility with repeated sprains. TREATMENT Treatment initially involves the use of ice, medication, and compression bandages to help reduce pain and inflammation. Ankle sprains are usually immobilized in a walking cast or boot to allow for healing. Crutches may be recommended to reduce pressure on the injury. After immobilization, strengthening and stretching exercises may be necessary to regain strength and a full range of motion. Surgery is rarely needed to treat ankle sprains. MEDICATION   Nonsteroidal anti-inflammatory medications, such as aspirin and ibuprofen (do not take for the first 3 days after injury or within 7 days before surgery), or other minor pain relievers, such as acetaminophen, are often recommended. Take these as directed by your caregiver. Contact your caregiver immediately if any bleeding, stomach upset, or signs of an allergic reaction occur from these medications.  Ointments applied to the skin may be helpful.  Pain relievers may be prescribed as necessary by your caregiver. Do not take prescription pain medication for longer than 4 to 7 days. Use only as directed and only as much as you need. HEAT AND COLD  Cold treatment (icing)  is used to relieve pain and reduce inflammation for acute and chronic cases. Cold should be applied for 10 to 15 minutes every 2 to 3 hours for inflammation and pain and immediately after any activity that aggravates your symptoms. Use ice packs or an ice massage.  Heat treatment may be used before performing stretching and strengthening activities prescribed by your caregiver. Use a heat pack or a warm soak. SEEK IMMEDIATE MEDICAL CARE IF:   Pain, swelling, or bruising worsens despite treatment.  You experience pain, numbness, discoloration, or coldness in the foot or toes.  New, unexplained symptoms develop (drugs used in treatment may produce side effects.) EXERCISES  PHASE I EXERCISES RANGE OF MOTION (ROM) AND STRETCHING EXERCISES - Ankle Sprain, Acute Phase I, Weeks 1 to 2 These exercises may help you when beginning to restore flexibility in your ankle. You will likely work on these exercises for the 1 to 2 weeks after your injury. Once your physician, physical therapist, or athletic trainer sees adequate progress, he or she will advance your exercises. While completing these exercises, remember:   Restoring tissue flexibility helps normal motion to return to the joints. This allows healthier, less painful movement and activity.  An effective stretch should be held for at least 30 seconds.  A stretch should never be painful. You should only feel a gentle lengthening or release in the stretched tissue. RANGE OF MOTION - Dorsi/Plantar Flexion  While sitting with your right / left knee straight, draw the top of your foot upwards by flexing your ankle. Then reverse the motion, pointing your toes downward.  Hold each position for __________ seconds.  After completing your first set of exercises, repeat this exercise with your knee bent. Repeat __________ times. Complete this exercise __________ times per day.  RANGE OF MOTION - Ankle Alphabet  Imagine your right / left big toe is a  pen.  Keeping your hip and knee still, write out the entire alphabet with your "pen." Make the letters as large as you can without increasing any discomfort. Repeat __________ times. Complete this exercise __________ times per day.  STRENGTHENING EXERCISES - Ankle Sprain, Acute -Phase I, Weeks 1 to 2 These exercises may help you when beginning to restore strength in your ankle. You will likely work on these exercises for 1 to 2 weeks after your injury. Once your physician, physical therapist, or athletic trainer sees adequate progress, he or she will advance your exercises. While completing these exercises, remember:   Muscles can gain both the endurance and the strength needed for everyday activities through controlled exercises.  Complete these exercises as instructed by your physician, physical therapist, or athletic trainer. Progress the resistance and repetitions only as guided.  You may experience muscle soreness or fatigue, but the pain or discomfort you are trying to eliminate should never worsen during these exercises. If this pain does worsen, stop and make certain you are following the directions exactly. If the pain is still present after adjustments, discontinue the exercise until you can discuss the trouble with your clinician. STRENGTH - Dorsiflexors  Secure a rubber exercise band/tubing to a fixed object (i.e., table, pole) and loop the other end around your right / left foot.  Sit on the floor facing the fixed object. The band/tubing should be slightly tense when your foot is relaxed.  Slowly draw your foot back toward you using your ankle and toes.  Hold this position for __________ seconds. Slowly release the tension in the band and return your foot to the starting position. Repeat __________ times. Complete this exercise __________ times per day.  STRENGTH - Plantar-flexors   Sit with your right / left leg extended. Holding onto both ends of a rubber exercise band/tubing,  loop it around the ball of your foot. Keep a slight tension in the band.  Slowly push your toes away from you, pointing them downward.  Hold this position for __________ seconds. Return slowly, controlling the tension in the band/tubing. Repeat __________ times. Complete this exercise __________ times per day.  STRENGTH - Ankle Eversion  Secure one end of a rubber exercise band/tubing to a fixed object (table, pole). Loop the other end around your foot just before your toes.  Place your fists between your knees. This will focus your strengthening at your ankle.  Drawing the band/tubing across your opposite foot, slowly, pull your little toe out and up. Make sure the band/tubing is positioned to resist the entire motion.  Hold this position for __________ seconds. Have your muscles resist the band/tubing as it slowly pulls your foot back to the starting position.  Repeat __________ times. Complete this exercise __________ times per day.  STRENGTH - Ankle Inversion  Secure one end of a rubber exercise band/tubing to a fixed object (table, pole).  Loop the other end around your foot just before your toes.  Place your fists between your knees. This will focus your strengthening at your ankle.  Slowly, pull your big toe up and in, making sure the band/tubing is positioned to resist the entire motion.  Hold this position for __________ seconds.  Have your muscles resist the band/tubing as it slowly pulls your foot back to the starting position. Repeat __________ times. Complete this exercises __________ times per day.  STRENGTH - Towel Curls  Sit in a chair positioned on a non-carpeted surface.  Place your right / left foot on a towel, keeping your heel on the floor.  Pull the towel toward your heel by only curling your toes. Keep your heel on the floor.  If instructed by your physician, physical therapist, or athletic trainer, add weight to the end of the towel. Repeat __________  times. Complete this exercise __________ times per day.   This information is not intended to replace advice given to you by your health care provider. Make sure you discuss any questions you have with your health care provider.   Document Released: 09/07/2004 Document Revised: 02/27/2014 Document Reviewed: 05/21/2008 Elsevier Interactive Patient Education [employer].

## 2015-08-14 NOTE — Progress Notes (Signed)
Subjective:    Patient ID: Amanda Cox, female    DOB: January 26, 1976, 40 y.o.   MRN: [id number]  08/14/2015  Ankle Pain   HPI This 40 y.o. female presents for evaluation of R medial ankle pain. No pain to touch but pain with walking.  Dull pain with sitting or laying down.  Onset yesterday.  No unusual activity; no trauma; no new shoes.  Customer service; desk work.  No numbness/tingling; no swelling.  +limping at times; pain with walking.  No medications.  No ice or heat to area.  No topical agents.  No previous injury. LMP 07/22/2015.  PCP:  Juanda Crumble Harper/gynecology  Review of Systems  Constitutional: Negative for fever, chills, diaphoresis and fatigue.  Musculoskeletal: Positive for joint swelling, arthralgias and gait problem.  Neurological: Negative for weakness and numbness.    Past Medical History  Diagnosis Date  . Irregular menstrual cycle   . Genital herpes, unspecified   . Dyspareunia   . Essential hypertension, benign   . Surveillance of previously prescribed contraceptive pill    Past Surgical History  Procedure Laterality Date  . Breast reduction surgery    . Cesarean section     Allergies  Allergen Reactions  . Sulfa Antibiotics Shortness Of Breath    Social History   Social History  . Marital Status: Single    Spouse Name: N/A  . Number of Children: 1  . Years of Education: College   Occupational History  .  [employer]   Social History Main Topics  . Smoking status: Never Smoker   . Smokeless tobacco: Never Used  . Alcohol Use: 0.0 oz/week    0 Standard drinks or equivalent per week     Comment: occas.  . Drug Use: No  . Sexual Activity:    Partners: Male    Birth Control/ Protection: OCP   Other Topics Concern  . Not on file   Social History Narrative   Patient is single and lives at home, her daughter lives with her.   Patient has a college education.   Patient is right-handed.   Patient drinks sodas three times per month.     Patient has one child.   Family History  Problem Relation Age of Onset  . Diabetes Mother   . Hyperlipidemia Mother   . Arthritis Mother   . Diabetes Sister   . Diabetes Maternal Grandmother   . Arthritis Paternal Grandmother   . Congenital heart disease         Objective:    BP 130/90 mmHg  Pulse 73  Temp(Src) 98.4 F (36.9 C) (Oral)  Resp 17  Ht 5\' 4"  (1.626 m)  Wt 185 lb (83.915 kg)  BMI 31.74 kg/m2  SpO2 100%  LMP 07/22/2015 (Exact Date) Physical Exam  Constitutional: She is oriented to person, place, and time. She appears well-developed and well-nourished. No distress.  HENT:  Head: Normocephalic and atraumatic.  Eyes: Conjunctivae are normal. Pupils are equal, round, and reactive to light.  Neck: Normal range of motion. Neck supple.  Cardiovascular: Normal rate, regular rhythm and normal heart sounds.  Exam reveals no gallop and no friction rub.   No murmur heard. Pulmonary/Chest: Effort normal and breath sounds normal. She has no wheezes. She has no rales.  Musculoskeletal:       Right ankle: She exhibits decreased range of motion. She exhibits no swelling, no ecchymosis and no deformity. Tenderness. Medial malleolus tenderness found. No lateral malleolus, no AITFL, no CF  ligament, no posterior TFL, no head of 5th metatarsal and no proximal fibula tenderness found. Achilles tendon normal. Achilles tendon exhibits no pain, no defect and normal Thompson's test results.       Right foot: Normal. There is normal range of motion, no tenderness and no bony tenderness.  Neurological: She is alert and oriented to person, place, and time.  Skin: She is not diaphoretic.  Psychiatric: She has a normal mood and affect. Her behavior is normal.  Nursing note and vitals reviewed.  Dg Ankle Complete Right  08/14/2015  CLINICAL DATA:  Medial RIGHT ankle pain since yesterday, no known injury EXAM: RIGHT ANKLE - COMPLETE 3+ VIEW COMPARISON:  None FINDINGS: Osseous mineralization  normal. Joint spaces preserved. No fracture, dislocation, or bone destruction. IMPRESSION: Normal exam. Electronically Signed   By: Lavonia Dana M.D.   On: 08/14/2015 11:27        Assessment & Plan:   1. Sprain of right medial ankle joint, initial encounter    -new -ice, rest, elevate, supportive shoes. -sweedo ankle brace provided.   Orders Placed This Encounter  Procedures  . DG Ankle Complete Right    Standing Status: Future     Number of Occurrences: 1     Standing Expiration Date: 08/13/2016    Order Specific Question:  Reason for Exam (SYMPTOM  OR DIAGNOSIS REQUIRED)    Answer:  R medial ankle pain; no swelling; no injury    Order Specific Question:  Is the patient pregnant?    Answer:  No    Order Specific Question:  Preferred imaging location?    Answer:  External  . Apply ankle splint air cast    Scheduling Instructions:     RIGHT ANKLE   Meds ordered this encounter  Medications  . meloxicam (MOBIC) 15 MG tablet    Sig: Take 1 tablet (15 mg total) by mouth daily.    Dispense:  30 tablet    Refill:  0    No Follow-up on file.    Tehran Rabenold Elayne Guerin, M.D. [facility] [address] [place], [place]  [address] [phone] phone [phone] fax

## 2015-08-16 ENCOUNTER — Ambulatory Visit (INDEPENDENT_AMBULATORY_CARE_PROVIDER_SITE_OTHER): Payer: 59 | Admitting: Obstetrics

## 2015-08-16 ENCOUNTER — Encounter: Payer: Self-pay | Admitting: Obstetrics

## 2015-08-16 VITALS — BP 118/81 | HR 80 | Temp 99.5°F | Wt 190.0 lb

## 2015-08-16 DIAGNOSIS — B373 Candidiasis of vulva and vagina: Secondary | ICD-10-CM

## 2015-08-16 DIAGNOSIS — N946 Dysmenorrhea, unspecified: Secondary | ICD-10-CM

## 2015-08-16 DIAGNOSIS — I10 Essential (primary) hypertension: Secondary | ICD-10-CM

## 2015-08-16 DIAGNOSIS — N760 Acute vaginitis: Secondary | ICD-10-CM

## 2015-08-16 DIAGNOSIS — A6000 Herpesviral infection of urogenital system, unspecified: Secondary | ICD-10-CM

## 2015-08-16 DIAGNOSIS — Z01419 Encounter for gynecological examination (general) (routine) without abnormal findings: Secondary | ICD-10-CM

## 2015-08-16 DIAGNOSIS — B9689 Other specified bacterial agents as the cause of diseases classified elsewhere: Secondary | ICD-10-CM

## 2015-08-16 DIAGNOSIS — Z113 Encounter for screening for infections with a predominantly sexual mode of transmission: Secondary | ICD-10-CM

## 2015-08-16 DIAGNOSIS — Z3041 Encounter for surveillance of contraceptive pills: Secondary | ICD-10-CM

## 2015-08-16 DIAGNOSIS — B3731 Acute candidiasis of vulva and vagina: Secondary | ICD-10-CM

## 2015-08-16 LAB — POCT URINALYSIS DIPSTICK
Bilirubin, UA: NEGATIVE
Glucose, UA: NEGATIVE
Ketones, UA: NEGATIVE
Leukocytes, UA: NEGATIVE
Nitrite, UA: NEGATIVE
Spec Grav, UA: 1.015
Urobilinogen, UA: NEGATIVE
pH, UA: 6

## 2015-08-16 MED ORDER — IBUPROFEN 800 MG PO TABS: 800.0000 mg | ORAL_TABLET | Freq: Three times a day (TID) | ORAL | Status: DC | PRN

## 2015-08-16 MED ORDER — VALTREX 500 MG PO TABS: ORAL_TABLET | ORAL | Status: DC

## 2015-08-16 MED ORDER — METRONIDAZOLE 0.75 % VA GEL: 1.0000 | Freq: Two times a day (BID) | VAGINAL | Status: DC

## 2015-08-16 MED ORDER — NORGESTIM-ETH ESTRAD TRIPHASIC 0.18/0.215/0.25 MG-25 MCG PO TABS: 1.0000 | ORAL_TABLET | Freq: Every day | ORAL | Status: DC

## 2015-08-16 MED ORDER — HYDROCHLOROTHIAZIDE 25 MG PO TABS: 25.0000 mg | ORAL_TABLET | Freq: Every day | ORAL | Status: DC

## 2015-08-16 MED ORDER — FLUCONAZOLE 200 MG PO TABS
ORAL_TABLET | ORAL | Status: DC
Start: 2015-08-16 — End: 2016-07-12

## 2015-08-16 NOTE — Progress Notes (Signed)
Subjective:        Amanda Cox is a 40 y.o. female here for a routine exam.  Current complaints: None.    Personal health questionnaire:  Is patient [name], have a family history of breast and/or ovarian cancer: no Is there a family history of uterine cancer diagnosed at age < 57, gastrointestinal cancer, urinary tract cancer, family member who is a Field seismologist syndrome-associated carrier: no Is the patient overweight and hypertensive, family history of diabetes, personal history of gestational diabetes, preeclampsia or PCOS: no Is patient over 15, have PCOS,  family history of premature CHD under age 32, diabetes, smoke, have hypertension or peripheral artery disease:  no At any time, has a partner hit, kicked or otherwise hurt or frightened you?: no Over the past 2 weeks, have you felt down, depressed or hopeless?: no Over the past 2 weeks, have you felt little interest or pleasure in doing things?:no   Gynecologic History Patient's last menstrual period was 07/22/2015 (exact date). Contraception: OCP (estrogen/progesterone) Last Pap: 2016. Results were: normal Last mammogram: n/a. Results were: n/a  Obstetric History OB History  Gravida Para Term Preterm AB SAB TAB Ectopic Multiple Living  2 1 1  1  1   1     # Outcome Date GA Lbr Len/2nd Weight Sex Delivery Anes PTL Lv  2 Term 07/22/01 [redacted]w[redacted]d  7 lb (3.175 kg) F CS-LTranv EPI N Y  1 TAB               Past Medical History  Diagnosis Date  . Irregular menstrual cycle   . Genital herpes, unspecified   . Dyspareunia   . Essential hypertension, benign   . Surveillance of previously prescribed contraceptive pill     Past Surgical History  Procedure Laterality Date  . Breast reduction surgery    . Cesarean section       Current outpatient prescriptions:  .  hydrochlorothiazide (HYDRODIURIL) 25 MG tablet, Take 1 tablet (25 mg total) by mouth daily., Disp: 30 tablet, Rfl: 11 .  meloxicam (MOBIC) 15 MG tablet,  Take 1 tablet (15 mg total) by mouth daily., Disp: 30 tablet, Rfl: 0 .  Norgestimate-Ethinyl Estradiol Triphasic 0.18/0.215/0.25 MG-25 MCG tab, Take 1 tablet by mouth daily., Disp: 28 tablet, Rfl: 11 .  VALTREX 500 MG tablet, TAKE 1 TABLET (500 MG TOTAL) BY MOUTH DAILY., Disp: 30 tablet, Rfl: prn .  fluconazole (DIFLUCAN) 200 MG tablet, 1 tablet po every other day prn., Disp: 3 tablet, Rfl: 2 .  ibuprofen (ADVIL,MOTRIN) 800 MG tablet, Take 1 tablet (800 mg total) by mouth every 8 (eight) hours as needed., Disp: 30 tablet, Rfl: 5 .  metroNIDAZOLE (METROGEL VAGINAL) 0.75 % vaginal gel, Place 1 Applicatorful vaginally 2 (two) times daily., Disp: 70 g, Rfl: 4 .  [DISCONTINUED] furosemide (LASIX) 20 MG tablet, TAKE 1 TABLET (20 MG TOTAL) BY MOUTH DAILY AS NEEDED., Disp: 90 tablet, Rfl: 1 Allergies  Allergen Reactions  . Sulfa Antibiotics Shortness Of Breath    Social History  Substance Use Topics  . Smoking status: Never Smoker   . Smokeless tobacco: Never Used  . Alcohol Use: 0.0 oz/week    0 Standard drinks or equivalent per week     Comment: occas.    Family History  Problem Relation Age of Onset  . Diabetes Mother   . Hyperlipidemia Mother   . Arthritis Mother   . Diabetes Sister   . Diabetes Maternal Grandmother   . Arthritis Paternal  Grandmother   . Congenital heart disease        Review of Systems  Constitutional: negative for fatigue and weight loss Respiratory: negative for cough and wheezing Cardiovascular: negative for chest pain, fatigue and palpitations Gastrointestinal: negative for abdominal pain and change in bowel habits Musculoskeletal:negative for myalgias Neurological: negative for gait problems and tremors Behavioral/Psych: negative for abusive relationship, depression Endocrine: negative for temperature intolerance   Genitourinary:negative for abnormal menstrual periods, genital lesions, hot flashes, sexual problems and vaginal discharge Integument/breast:  negative for breast lump, breast tenderness, nipple discharge and skin lesion(s)    Objective:       BP 118/81 mmHg  Pulse 80  Temp(Src) 99.5 F (37.5 C)  Wt 190 lb (86.183 kg)  LMP 07/22/2015 (Exact Date) General:   alert  Skin:   no rash or abnormalities  Lungs:   clear to auscultation bilaterally  Heart:   regular rate and rhythm, S1, S2 normal, no murmur, click, rub or gallop  Breasts:   normal without suspicious masses, skin or nipple changes or axillary nodes  Abdomen:  normal findings: no organomegaly, soft, non-tender and no hernia  Pelvis:  External genitalia: normal general appearance Urinary system: urethral meatus normal and bladder without fullness, nontender Vaginal: normal without tenderness, induration or masses Cervix: normal appearance Adnexa: normal bimanual exam Uterus: anteverted and non-tender, normal size   Lab Review Urine pregnancy test Labs reviewed yes Radiologic studies reviewed no    Assessment:    Healthy female exam.    Contraceptive Surveillance.  Pleased with OCP's.   Plan:    Education reviewed: calcium supplements, low fat, low cholesterol diet, safe sex/STD prevention, self breast exams and weight bearing exercise. Contraception: OCP (estrogen/progesterone). Follow up in: 1 year.   Meds ordered this encounter  Medications  . Norgestimate-Ethinyl Estradiol Triphasic 0.18/0.215/0.25 MG-25 MCG tab    Sig: Take 1 tablet by mouth daily.    Dispense:  28 tablet    Refill:  11  . VALTREX 500 MG tablet    Sig: TAKE 1 TABLET (500 MG TOTAL) BY MOUTH DAILY.    Dispense:  30 tablet    Refill:  prn  . hydrochlorothiazide (HYDRODIURIL) 25 MG tablet    Sig: Take 1 tablet (25 mg total) by mouth daily.    Dispense:  30 tablet    Refill:  11  . fluconazole (DIFLUCAN) 200 MG tablet    Sig: 1 tablet po every other day prn.    Dispense:  3 tablet    Refill:  2  . metroNIDAZOLE (METROGEL VAGINAL) 0.75 % vaginal gel    Sig: Place 1  Applicatorful vaginally 2 (two) times daily.    Dispense:  70 g    Refill:  4  . ibuprofen (ADVIL,MOTRIN) 800 MG tablet    Sig: Take 1 tablet (800 mg total) by mouth every 8 (eight) hours as needed.    Dispense:  30 tablet    Refill:  5   Orders Placed This Encounter  Procedures  . HIV antibody  . Hepatitis B surface antigen  . RPR  . Hepatitis C antibody  . POCT urinalysis dipstick

## 2015-08-17 ENCOUNTER — Telehealth (HOSPITAL_COMMUNITY): Payer: Self-pay | Admitting: *Deleted

## 2015-08-17 LAB — HIV ANTIBODY (ROUTINE TESTING W REFLEX): HIV SCREEN 4TH GENERATION: NONREACTIVE

## 2015-08-17 LAB — HEPATITIS C ANTIBODY: Hep C Virus Ab: <0.1 s/co ratio (ref 0.0–0.9)

## 2015-08-17 LAB — RPR: RPR: NONREACTIVE

## 2015-08-17 LAB — HEPATITIS B SURFACE ANTIGEN: HEP B S AG: NEGATIVE

## 2015-08-18 LAB — PAP IG W/ RFLX HPV ASCU: PAP SMEAR COMMENT: 0

## 2015-08-20 LAB — NUSWAB VG+, CANDIDA 6SP
CANDIDA ALBICANS, NAA: NEGATIVE
CANDIDA GLABRATA, NAA: NEGATIVE
CANDIDA KRUSEI, NAA: NEGATIVE
CANDIDA LUSITANIAE, NAA: NEGATIVE
CANDIDA PARAPSILOSIS, NAA: NEGATIVE
CHLAMYDIA TRACHOMATIS, NAA: NEGATIVE
Candida tropicalis, NAA: NEGATIVE
NEISSERIA GONORRHOEAE, NAA: NEGATIVE
Trich vag by NAA: NEGATIVE

## 2015-08-30 ENCOUNTER — Other Ambulatory Visit: Payer: Self-pay | Admitting: *Deleted

## 2015-08-30 DIAGNOSIS — A6000 Herpesviral infection of urogenital system, unspecified: Secondary | ICD-10-CM

## 2015-08-30 MED ORDER — FAMCICLOVIR 500 MG PO TABS: 1000.0000 mg | ORAL_TABLET | Freq: Two times a day (BID) | ORAL | Status: DC

## 2015-09-02 ENCOUNTER — Telehealth: Payer: Self-pay | Admitting: *Deleted

## 2015-09-02 NOTE — Telephone Encounter (Signed)
See phone note for this encounter. 

## 2015-09-02 NOTE — Telephone Encounter (Signed)
See note for this telephone encounter.

## 2015-09-10 ENCOUNTER — Encounter (HOSPITAL_COMMUNITY): Payer: Self-pay | Admitting: *Deleted

## 2015-09-10 ENCOUNTER — Ambulatory Visit (HOSPITAL_COMMUNITY)
Admission: EM | Admit: 2015-09-10 | Discharge: 2015-09-10 | Disposition: A | Discharge status: Home / Self Care | Payer: 59 | Attending: Family Medicine | Admitting: Family Medicine

## 2015-09-10 ENCOUNTER — Ambulatory Visit: Payer: 59

## 2015-09-10 DIAGNOSIS — L089 Local infection of the skin and subcutaneous tissue, unspecified: Secondary | ICD-10-CM

## 2015-09-10 DIAGNOSIS — L723 Sebaceous cyst: Secondary | ICD-10-CM

## 2015-09-10 NOTE — ED Notes (Signed)
Pt  Reports   A  Bump on  Her  back  With onset  Of  Symptoms     For     sev  Months  The  Bump  Is  Tender  To  The  Touch

## 2015-09-10 NOTE — ED Provider Notes (Signed)
CSN: [id number]     Arrival date & time 09/10/15  0957 History   First MD Initiated Contact with Patient 09/10/15 1017     Chief Complaint  Patient presents with  . Abscess   (Consider location/radiation/quality/duration/timing/severity/associated sxs/prior Treatment) Patient is a 40 y.o. female presenting with rash. The history is provided by the patient.  Rash Location:  Torso Torso rash location:  Lower back Quality: painful, redness and swelling   Pain details:    Quality:  Sore   Duration:  6 months Severity:  Mild Onset quality:  Gradual Chronicity:  Chronic Relieved by:  Nothing Worsened by:  Nothing tried Ineffective treatments:  None tried   Past Medical History  Diagnosis Date  . Irregular menstrual cycle   . Genital herpes, unspecified   . Dyspareunia   . Essential hypertension, benign   . Surveillance of previously prescribed contraceptive pill    Past Surgical History  Procedure Laterality Date  . Breast reduction surgery    . Cesarean section     Family History  Problem Relation Age of Onset  . Diabetes Mother   . Hyperlipidemia Mother   . Arthritis Mother   . Diabetes Sister   . Diabetes Maternal Grandmother   . Arthritis Paternal Grandmother   . Congenital heart disease     Social History  Substance Use Topics  . Smoking status: Never Smoker   . Smokeless tobacco: Never Used  . Alcohol Use: 0.0 oz/week    0 Standard drinks or equivalent per week     Comment: occas.   OB History    Gravida Para Term Preterm AB TAB SAB Ectopic Multiple Living   2 1 1  1 1    1      Review of Systems  Constitutional: Negative.   Skin: Positive for rash.  All other systems reviewed and are negative.   Allergies  Sulfa antibiotics  Home Medications   Prior to Admission medications   Medication Sig Start Date End Date Taking? Authorizing Provider  famciclovir (FAMVIR) 500 MG tablet Take 2 tablets (1,000 mg total) by mouth 2 (two) times daily. Use for 2  days as instructed for outbreaks 08/30/15   [name], MD  fluconazole (DIFLUCAN) 200 MG tablet 1 tablet po every other day prn. 08/16/15   [name], MD  hydrochlorothiazide (HYDRODIURIL) 25 MG tablet Take 1 tablet (25 mg total) by mouth daily. 08/16/15   [name], MD  ibuprofen (ADVIL,MOTRIN) 800 MG tablet Take 1 tablet (800 mg total) by mouth every 8 (eight) hours as needed. 08/16/15   [name], MD  meloxicam (MOBIC) 15 MG tablet Take 1 tablet (15 mg total) by mouth daily. 08/14/15   Wardell Honour, MD  metroNIDAZOLE (METROGEL VAGINAL) 0.75 % vaginal gel Place 1 Applicatorful vaginally 2 (two) times daily. 08/16/15   [name], MD  Norgestimate-Ethinyl Estradiol Triphasic 0.18/0.215/0.25 MG-25 MCG tab Take 1 tablet by mouth daily. 08/16/15   [name], MD  VALTREX 500 MG tablet TAKE 1 TABLET (500 MG TOTAL) BY MOUTH DAILY. 08/16/15   [name], MD   Meds Ordered and Administered this Visit  Medications - No data to display  BP 138/89 mmHg  Pulse 79  Temp(Src) 98.9 F (37.2 C) (Oral)  Resp 12  SpO2 100%  LMP 07/22/2015 (Exact Date) No data found.   Physical Exam  Constitutional: She is oriented to person, place, and time. She appears well-developed and well-nourished. No  distress.  Musculoskeletal: She exhibits tenderness.  Neurological: She is alert and oriented to person, place, and time.  Skin: Skin is warm and dry. Rash noted.  Nursing note and vitals reviewed.   ED Course  .Marland KitchenIncision and Drainage Date/Time: 09/10/2015 10:42 AM Performed by: Billy Fischer Authorized by: Ihor Gully D Consent: Verbal consent obtained. Type: abscess Body area: trunk Location details: back Local anesthetic: topical anesthetic Patient sedated: no Scalpel size: 11 Incision type: single straight Incision depth: dermal Wound treatment: wound left open Patient tolerance: Patient tolerated the procedure well with no immediate complications    (including critical care time)  Labs Review Labs Reviewed - No data to display  Imaging Review No results found.   Visual Acuity Review  Right Eye Distance:   Left Eye Distance:   Bilateral Distance:    Right Eye Near:   Left Eye Near:    Bilateral Near:         MDM   1. Infected sebaceous cyst of skin        Billy Fischer, MD 09/10/15 [phone]

## 2015-09-10 NOTE — Discharge Instructions (Signed)
Return as needed

## 2015-12-31 ENCOUNTER — Other Ambulatory Visit: Payer: Self-pay | Admitting: Obstetrics

## 2015-12-31 ENCOUNTER — Telehealth: Payer: Self-pay | Admitting: *Deleted

## 2015-12-31 DIAGNOSIS — Z3041 Encounter for surveillance of contraceptive pills: Secondary | ICD-10-CM

## 2015-12-31 MED ORDER — NORGESTIM-ETH ESTRAD TRIPHASIC 0.18/0.215/0.25 MG-25 MCG PO TABS: 1.0000 | ORAL_TABLET | Freq: Every day | ORAL | 11 refills | Status: DC

## 2015-12-31 NOTE — Telephone Encounter (Signed)
Patient wants a refill of her OCP sent to her pharmacy- she has had her annual exam- but her pills did not get refilled.She takes Orthotricyclen Lo.

## 2016-01-27 ENCOUNTER — Encounter (HOSPITAL_COMMUNITY): Payer: Self-pay | Admitting: Emergency Medicine

## 2016-01-27 ENCOUNTER — Ambulatory Visit (HOSPITAL_COMMUNITY)
Admission: EM | Admit: 2016-01-27 | Discharge: 2016-01-27 | Disposition: A | Discharge status: Home / Self Care | Payer: 59 | Attending: Emergency Medicine | Admitting: Emergency Medicine

## 2016-01-27 DIAGNOSIS — J069 Acute upper respiratory infection, unspecified: Secondary | ICD-10-CM | POA: Diagnosis not present

## 2016-01-27 DIAGNOSIS — R0982 Postnasal drip: Secondary | ICD-10-CM

## 2016-01-27 MED ORDER — IPRATROPIUM BROMIDE 0.06 % NA SOLN: 2.0000 | Freq: Four times a day (QID) | NASAL | 0 refills | Status: DC

## 2016-01-27 MED ORDER — AMOXICILLIN 500 MG PO CAPS: 500.0000 mg | ORAL_CAPSULE | Freq: Three times a day (TID) | ORAL | 0 refills | Status: DC

## 2016-01-27 NOTE — ED Provider Notes (Signed)
CSN: [id number]     Arrival date & time 01/27/16  1713 History   First MD Initiated Contact with Patient 01/27/16 1806     Chief Complaint  Patient presents with  . URI   (Consider location/radiation/quality/duration/timing/severity/associated sxs/prior Treatment) 40 year old female complaining of upper rest or symptoms such as nose burning, sneezing, nose stopped up, cough, PND and occasional nausea. She has had symptoms for 10-14 days. She is not getting better. She states she also works for [employer] and around a lot of boxes and believes that she may also have allergies associated with it. She is requesting antibiotics.      Past Medical History:  Diagnosis Date  . Dyspareunia   . Essential hypertension, benign   . Genital herpes, unspecified   . Irregular menstrual cycle   . Surveillance of previously prescribed contraceptive pill    Past Surgical History:  Procedure Laterality Date  . BREAST REDUCTION SURGERY    . CESAREAN SECTION     Family History  Problem Relation Age of Onset  . Diabetes Mother   . Hyperlipidemia Mother   . Arthritis Mother   . Diabetes Sister   . Diabetes Maternal Grandmother   . Arthritis Paternal Grandmother   . Congenital heart disease     Social History  Substance Use Topics  . Smoking status: Never Smoker  . Smokeless tobacco: Never Used  . Alcohol use 0.0 oz/week     Comment: occas.   OB History    Gravida Para Term Preterm AB Living   2 1 1   1 1    SAB TAB Ectopic Multiple Live Births     1     1     Review of Systems  Constitutional: Negative.        Noted in HPI otherwise Negative  HENT: Positive for congestion, rhinorrhea, sneezing and sore throat. Negative for ear pain and trouble swallowing.        Noted in HPI, otherwise negative  Eyes: Negative for pain.  Respiratory: Positive for cough. Negative for chest tightness and shortness of breath.   Cardiovascular: Negative for chest pain, palpitations and leg swelling.   Gastrointestinal: Negative.   Genitourinary: Negative.   Musculoskeletal: Negative.   Skin: Negative for rash.  Neurological: Negative.   Psychiatric/Behavioral: Negative.   All other systems reviewed and are negative.   Allergies  Sulfa antibiotics  Home Medications   Prior to Admission medications   Medication Sig Start Date End Date Taking? Authorizing Provider  famciclovir (FAMVIR) 500 MG tablet Take 2 tablets (1,000 mg total) by mouth 2 (two) times daily. Use for 2 days as instructed for outbreaks 08/30/15  Yes [name], MD  hydrochlorothiazide (HYDRODIURIL) 25 MG tablet Take 1 tablet (25 mg total) by mouth daily. 08/16/15  Yes [name], MD  ibuprofen (ADVIL,MOTRIN) 800 MG tablet Take 1 tablet (800 mg total) by mouth every 8 (eight) hours as needed. 08/16/15  Yes [name], MD  Norgestimate-Ethinyl Estradiol Triphasic 0.18/0.215/0.25 MG-25 MCG tab Take 1 tablet by mouth daily. 12/31/15  Yes [name], MD  amoxicillin (AMOXIL) 500 MG capsule Take 1 capsule (500 mg total) by mouth 3 (three) times daily. 01/27/16   Janne Napoleon, NP  fluconazole (DIFLUCAN) 200 MG tablet 1 tablet po every other day prn. 08/16/15   [name], MD  ipratropium (ATROVENT) 0.06 % nasal spray Place 2 sprays into both nostrils 4 (four) times daily. 01/27/16   Janne Napoleon, NP  VALTREX 500 MG tablet TAKE 1 TABLET (500 MG TOTAL) BY MOUTH DAILY. 08/16/15   [name], MD   Meds Ordered and Administered this Visit  Medications - No data to display  BP 128/77 (BP Location: Left Arm)   Pulse 79   Temp 99 F (37.2 C) (Oral)   Resp 16   LMP 01/06/2016   SpO2 100%  No data found.   Physical Exam  Constitutional: She is oriented to person, place, and time. She appears well-developed and well-nourished. No distress.  HENT:  Head: Normocephalic and atraumatic.  Bilateral TMs are normal. Oropharynx with minor erythema cobblestoning and clear PND. No swelling, exudates.  Eyes:  EOM are normal.  Neck: Normal range of motion. Neck supple.  Cardiovascular: Normal rate, regular rhythm and normal heart sounds.   Pulmonary/Chest: Effort normal and breath sounds normal. No respiratory distress.  Musculoskeletal: Normal range of motion. She exhibits no edema.  Lymphadenopathy:    She has no cervical adenopathy.  Neurological: She is alert and oriented to person, place, and time.  Skin: Skin is warm. No rash noted.  Psychiatric: She has a normal mood and affect.  Nursing note and vitals reviewed.   Urgent Care Course   Clinical Course     Procedures (including critical care time)  Labs Review Labs Reviewed - No data to display  Imaging Review No results found.   Visual Acuity Review  Right Eye Distance:   Left Eye Distance:   Bilateral Distance:    Right Eye Near:   Left Eye Near:    Bilateral Near:         MDM   1. Acute upper respiratory infection   2. PND (post-nasal drip)    Sudafed PE 10 mg every 4 to 6 hours as needed for congestion Allegra or Zyrtec daily as needed for drainage and runny nose. For stronger antihistamine may take Chlor-Trimeton 2 to 4 mg every 4 to 6 hours, may cause drowsiness. Saline nasal spray used frequently. Ibuprofen 600 mg every 6 hours as needed for pain, discomfort or fever. Drink plenty of fluids and stay well-hydrated. Cepacol lozenges for sore throat pain along with the ibuprofen. Antibiotic at pt's request for satisfaction and placebo effect.    Janne Napoleon, NP 01/27/16 [phone]

## 2016-01-27 NOTE — Discharge Instructions (Signed)
Sudafed PE 10 mg every 4 to 6 hours as needed for congestion Allegra or Zyrtec daily as needed for drainage and runny nose. For stronger antihistamine may take Chlor-Trimeton 2 to 4 mg every 4 to 6 hours, may cause drowsiness. Saline nasal spray used frequently. Ibuprofen 600 mg every 6 hours as needed for pain, discomfort or fever. Drink plenty of fluids and stay well-hydrated. Cepacol lozenges for sore throat pain along with the ibuprofen. Antibiotic at pt's request for satisfaction and placebo effect.

## 2016-01-27 NOTE — ED Triage Notes (Signed)
Here for cold sx onset 2 weeks associated w/nasal congestion/drainage, burning, sneezing, prod cough, ST, nauseas  Denies fevers, chills  Taking OTC Mucinex and Nyquil w/no relief.   A&O x4... NAD

## 2016-01-28 ENCOUNTER — Encounter (HOSPITAL_COMMUNITY): Payer: Self-pay | Admitting: Emergency Medicine

## 2016-01-28 ENCOUNTER — Emergency Department (HOSPITAL_COMMUNITY): Payer: 59

## 2016-01-28 ENCOUNTER — Emergency Department (HOSPITAL_COMMUNITY)
Admission: EM | Admit: 2016-01-28 | Discharge: 2016-01-28 | Disposition: A | Discharge status: Home / Self Care | Payer: 59 | Attending: Emergency Medicine | Admitting: Emergency Medicine

## 2016-01-28 DIAGNOSIS — J069 Acute upper respiratory infection, unspecified: Secondary | ICD-10-CM | POA: Diagnosis not present

## 2016-01-28 DIAGNOSIS — I10 Essential (primary) hypertension: Secondary | ICD-10-CM | POA: Insufficient documentation

## 2016-01-28 DIAGNOSIS — R05 Cough: Secondary | ICD-10-CM | POA: Diagnosis present

## 2016-01-28 LAB — RAPID STREP SCREEN (MED CTR MEBANE ONLY): Streptococcus, Group A Screen (Direct): NEGATIVE

## 2016-01-28 MED ORDER — GUAIFENESIN-CODEINE 100-10 MG/5ML PO SOLN: 5.0000 mL | Freq: Three times a day (TID) | ORAL | 0 refills | Status: DC | PRN

## 2016-01-28 NOTE — ED Provider Notes (Signed)
[facility] DEPT Provider Note   CSN: [id number] Arrival date & time: 01/28/16  1018   By signing my name below, I, Avnee Patel, attest that this documentation has been prepared under the direction and in the presence of  [employer], PA-C. Electronically Signed: Delton Pra[place], ED Scribe. 01/28/16. 11:44 AM.   History   Chief Complaint Chief Complaint  Patient presents with  . Cough  . Sore Throat   The history is provided by the patient. No language interpreter was used.   HPI Comments:  Amanda Cox is a 40 y.o. female who presents to the Emergency Department complaining of sudden onset, worsening sore throat x yesterday. Pt notes associated persistent cough, rhinorrhea, congestion and intermittent SOB x 2 weeks. Pt visited Urgent Care yesterday for similar symptoms and was prescribed amoxicillin with no relief. She has also taken Mucinex with no relief. Pt denies fevers, any other associated symptoms and modifying factors at this time.    Past Medical History:  Diagnosis Date  . Dyspareunia   . Essential hypertension, benign   . Genital herpes, unspecified   . Irregular menstrual cycle   . Surveillance of previously prescribed contraceptive pill     Patient Active Problem List   Diagnosis Date Noted  . Snoring 08/19/2013  . Obesity, unspecified 08/19/2013  . Screening examination for venereal disease 07/10/2013  . Essential hypertension, benign 07/10/2013  . Urinary tract infection, site not specified 07/10/2013  . Candidiasis of vulva and vagina 07/10/2013  . Genital herpes 07/17/2012  . BV (bacterial vaginosis) 07/17/2012    Past Surgical History:  Procedure Laterality Date  . BREAST REDUCTION SURGERY    . CESAREAN SECTION      OB History    Gravida Para Term Preterm AB Living   2 1 1   1 1    SAB TAB Ectopic Multiple Live Births     1     1       Home Medications    Prior to Admission medications   Medication Sig Start Date End Date Taking?  Authorizing Provider  amoxicillin (AMOXIL) 500 MG capsule Take 1 capsule (500 mg total) by mouth 3 (three) times daily. 01/27/16   Janne Napoleon, NP  famciclovir (FAMVIR) 500 MG tablet Take 2 tablets (1,000 mg total) by mouth 2 (two) times daily. Use for 2 days as instructed for outbreaks 08/30/15   [name], MD  fluconazole (DIFLUCAN) 200 MG tablet 1 tablet po every other day prn. 08/16/15   [name], MD  guaiFENesin-codeine 100-10 MG/5ML syrup Take 5 mLs by mouth 3 (three) times daily as needed for cough. 01/28/16   Okey Regal, PA-C  hydrochlorothiazide (HYDRODIURIL) 25 MG tablet Take 1 tablet (25 mg total) by mouth daily. 08/16/15   [name], MD  ibuprofen (ADVIL,MOTRIN) 800 MG tablet Take 1 tablet (800 mg total) by mouth every 8 (eight) hours as needed. 08/16/15   [name], MD  ipratropium (ATROVENT) 0.06 % nasal spray Place 2 sprays into both nostrils 4 (four) times daily. 01/27/16   Janne Napoleon, NP  Norgestimate-Ethinyl Estradiol Triphasic 0.18/0.215/0.25 MG-25 MCG tab Take 1 tablet by mouth daily. 12/31/15   [name], MD  VALTREX 500 MG tablet TAKE 1 TABLET (500 MG TOTAL) BY MOUTH DAILY. 08/16/15   [name], MD    Family History Family History  Problem Relation Age of Onset  . Diabetes Mother   . Hyperlipidemia Mother   . Arthritis Mother   .  Diabetes Sister   . Diabetes Maternal Grandmother   . Arthritis Paternal Grandmother   . Congenital heart disease      Social History Social History  Substance Use Topics  . Smoking status: Never Smoker  . Smokeless tobacco: Never Used  . Alcohol use 0.0 oz/week     Comment: occas.     Allergies   Sulfa antibiotics   Review of Systems Review of Systems  Constitutional: Negative for fever.  HENT: Positive for congestion, rhinorrhea and sore throat.   Respiratory: Positive for cough and shortness of breath.   All other systems reviewed and are negative.  Physical Exam Updated Vital  Signs BP 131/88 (BP Location: Right Arm)   Pulse 78   Temp 98.2 F (36.8 C) (Oral)   Resp 16   LMP 01/06/2016   SpO2 100%   Physical Exam  Constitutional: She is oriented to person, place, and time. She appears well-developed and well-nourished. No distress.  HENT:  Head: Normocephalic and atraumatic.  Mouth/Throat: Oropharynx is clear and moist. No oropharyngeal exudate, posterior oropharyngeal edema, posterior oropharyngeal erythema or tonsillar abscesses.  Eyes: Conjunctivae are normal.  Cardiovascular: Normal rate.   Pulmonary/Chest: Effort normal and breath sounds normal.  Abdominal: She exhibits no distension.  Neurological: She is alert and oriented to person, place, and time.  Skin: Skin is warm and dry.  Psychiatric: She has a normal mood and affect.  Nursing note and vitals reviewed.  ED Treatments / Results  DIAGNOSTIC STUDIES:  Oxygen Saturation is 100% on RA, normal by my interpretation.    COORDINATION OF CARE:  11:37 AM Discussed treatment plan with pt at bedside and pt agreed to plan.  Labs (all labs ordered are listed, but only abnormal results are displayed) Labs Reviewed  RAPID STREP SCREEN (NOT AT [facility])  CULTURE, GROUP A STREP [facility])    EKG  EKG Interpretation None       Radiology Dg Chest 2 View  Result Date: 01/28/2016 CLINICAL DATA:  Cough. EXAM: CHEST  2 VIEW COMPARISON:  CT 09/24/2004.  Chest x-ray 09/24/2004. FINDINGS: Mediastinum hilar structures normal. Lungs are clear. No pleural effusion or pneumothorax. No acute bony abnormality. IMPRESSION: No active cardiopulmonary disease. Electronically Signed   By: Marcello Moores  Register   On: 01/28/2016 11:57    Procedures Procedures (including critical care time)  Medications Ordered in ED Medications - No data to display   Initial Impression / Assessment and Plan / ED Course  I have reviewed the triage vital signs and the nursing notes.  Pertinent labs & imaging results that were available  during my care of the patient were reviewed by me and considered in my medical decision making (see chart for details).  Clinical Course     Labs:   Imaging:   Consults:   Therapeutics:   Discharge Meds:  Assessment/Plan:   14-year-old female presents today with likely viral upper respiratory infection. She has no signs of bacterial infection, afebrile nontoxic in no acute distress. She has clear lung sounds, clear chest x-ray, negative rapid strep. Patient is artery on amoxicillin, she is encouraged to use cough medication as directed, follow-up with primary care for reevaluation of symptoms persist, return to the ED if they worsen. She verbalized understanding and agreement to today's plan had no further questions or concerns at this time discharge  Final Clinical Impressions(s) / ED Diagnoses   Final diagnoses:  Upper respiratory tract infection, unspecified type    New Prescriptions Discharge Medication List as  of 01/28/2016 12:27 PM    START taking these medications   Details  guaiFENesin-codeine 100-10 MG/5ML syrup Take 5 mLs by mouth 3 (three) times daily as needed for cough., Starting Fri 01/28/2016, Print        I personally performed the services described in this documentation, which was scribed in my presence. The recorded information has been reviewed and is accurate.    Okey Regal, PA-C 01/28/16 [place], MD 02/03/16 [phone]

## 2016-01-28 NOTE — ED Notes (Signed)
Pt A&OX4, ambulatory at d/c with steady gait, NAD 

## 2016-01-28 NOTE — ED Triage Notes (Signed)
cough and cold x3 days saw ucc but today is worse sorethroat is worse also, given rx for amoxicillian

## 2016-01-28 NOTE — Discharge Instructions (Signed)
Please read attached information. If you experience any new or worsening signs or symptoms please return to the emergency room for evaluation. Please follow-up with your primary care provider or specialist as discussed. Please use medication prescribed only as directed and discontinue taking if you have any concerning signs or symptoms.   °

## 2016-01-31 LAB — CULTURE, GROUP A STREP (THRC)

## 2016-06-08 ENCOUNTER — Encounter (HOSPITAL_COMMUNITY): Payer: Self-pay | Admitting: Family Medicine

## 2016-06-08 ENCOUNTER — Ambulatory Visit (HOSPITAL_COMMUNITY)
Admission: EM | Admit: 2016-06-08 | Discharge: 2016-06-08 | Disposition: A | Discharge status: Home / Self Care | Payer: 59 | Attending: Family Medicine | Admitting: Family Medicine

## 2016-06-08 DIAGNOSIS — J039 Acute tonsillitis, unspecified: Secondary | ICD-10-CM

## 2016-06-08 MED ORDER — PREDNISONE 20 MG PO TABS: ORAL_TABLET | ORAL | 0 refills | Status: DC

## 2016-06-08 MED ORDER — AMOXICILLIN 875 MG PO TABS: 875.0000 mg | ORAL_TABLET | Freq: Two times a day (BID) | ORAL | 0 refills | Status: DC

## 2016-06-08 NOTE — ED Triage Notes (Signed)
Pt here for 3 weeks of URI symptoms. Taking OTC meds.

## 2016-06-08 NOTE — ED Provider Notes (Signed)
[facility]    CSN: [id number] Arrival date & time: 06/08/16  1304     History   Chief Complaint Chief Complaint  Patient presents with  . URI    HPI ALEXSYS ESKIN is a 41 y.o. female.   Pt here for 3 weeks of URI symptoms. Taking OTC meds and cough medicine she was provided on a previous visit. 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 She's having progressive sinus congestion but her main problem is her throat. She's been exposed to strep.      Past Medical History:  Diagnosis Date  . Dyspareunia   . Essential hypertension, benign   . Genital herpes, unspecified   . Irregular menstrual cycle   . Surveillance of previously prescribed contraceptive pill     Patient Active Problem List   Diagnosis Date Noted  . Snoring 08/19/2013  . Obesity, unspecified 08/19/2013  . Screening examination for venereal disease 07/10/2013  . Essential hypertension, benign 07/10/2013  . Urinary tract infection, site not specified 07/10/2013  . Candidiasis of vulva and vagina 07/10/2013  . Genital herpes 07/17/2012  . BV (bacterial vaginosis) 07/17/2012    Past Surgical History:  Procedure Laterality Date  . BREAST REDUCTION SURGERY    . CESAREAN SECTION      OB History    Gravida Para Term Preterm AB Living   2 1 1   1 1    SAB TAB Ectopic Multiple Live Births     1     1       Home Medications    Prior to Admission medications   Medication Sig Start Date End Date Taking? Authorizing Provider  amoxicillin (AMOXIL) 875 MG tablet Take 1 tablet (875 mg total) by mouth 2 (two) times daily. 06/08/16   Robyn Haber, MD  famciclovir (FAMVIR) 500 MG tablet Take 2 tablets (1,000 mg total) by mouth 2 (two) times daily. Use for 2 days as instructed for outbreaks 08/30/15   [name], MD  fluconazole (DIFLUCAN) 200 MG tablet 1 tablet po every other day prn. 08/16/15   [name], MD  guaiFENesin-codeine 100-10 MG/5ML  syrup Take 5 mLs by mouth 3 (three) times daily as needed for cough. 01/28/16   Okey Regal, PA-C  hydrochlorothiazide (HYDRODIURIL) 25 MG tablet Take 1 tablet (25 mg total) by mouth daily. 08/16/15   [name], MD  ibuprofen (ADVIL,MOTRIN) 800 MG tablet Take 1 tablet (800 mg total) by mouth every 8 (eight) hours as needed. 08/16/15   [name], MD  ipratropium (ATROVENT) 0.06 % nasal spray Place 2 sprays into both nostrils 4 (four) times daily. 01/27/16   Janne Napoleon, NP  Norgestimate-Ethinyl Estradiol Triphasic 0.18/0.215/0.25 MG-25 MCG tab Take 1 tablet by mouth daily. 12/31/15   [name], MD  predniSONE (DELTASONE) 20 MG tablet Two daily with food 06/08/16   Robyn Haber, MD  VALTREX 500 MG tablet TAKE 1 TABLET (500 MG TOTAL) BY MOUTH DAILY. 08/16/15   [name], MD    Family History Family History  Problem Relation Age of Onset  . Diabetes Mother   . Hyperlipidemia Mother   . Arthritis Mother   . Diabetes Sister   . Diabetes Maternal Grandmother   . Arthritis Paternal Grandmother   . Congenital heart disease      Social History Social History  Substance Use Topics  . Smoking status: Never Smoker  . Smokeless tobacco:  Never Used  . Alcohol use 0.0 oz/week     Comment: occas.     Allergies   Sulfa antibiotics   Review of Systems Review of Systems  HENT: Positive for congestion, ear pain, sore throat and voice change.   Respiratory: Positive for cough.   Musculoskeletal: Negative.   Neurological: Negative.   All other systems reviewed and are negative.    Physical Exam Triage Vital Signs ED Triage Vitals  Enc Vitals Group     BP 06/08/16 1318 124/67     Pulse Rate 06/08/16 1318 84     Resp 06/08/16 1318 18     Temp 06/08/16 1318 99.1 F (37.3 C)     Temp src --      SpO2 06/08/16 1318 94 %     Weight --      Height --      Head Circumference --      Peak Flow --      Pain Score 06/08/16 1315 8     Pain Loc --      Pain Edu?  --      Excl. in [place]? --    No data found.   Updated Vital Signs BP 124/67 (BP Location: Right Arm)   Pulse 84   Temp 99.1 F (37.3 C)   Resp 18   LMP 06/01/2016   SpO2 94%    Physical Exam  Constitutional: She is oriented to person, place, and time. She appears well-developed and well-nourished.  HENT:  Right Ear: External ear normal.  Left Ear: External ear normal.  Swollen tonsils without exudates, reddened Dull right TM  Eyes: Conjunctivae and EOM are normal. Pupils are equal, round, and reactive to light.  Neck: Normal range of motion. Neck supple.  Cardiovascular: Normal rate, regular rhythm and normal heart sounds.   Pulmonary/Chest: Effort normal and breath sounds normal.  Musculoskeletal: Normal range of motion.  Neurological: She is alert and oriented to person, place, and time.  Skin: Skin is warm and dry.  Nursing note and vitals reviewed.    UC Treatments / Results  Labs (all labs ordered are listed, but only abnormal results are displayed) Labs Reviewed - No data to display  EKG  EKG Interpretation None       Radiology No results found.  Procedures Procedures (including critical care time)  Medications Ordered in UC Medications - No data to display   Initial Impression / Assessment and Plan / UC Course  I have reviewed the triage vital signs and the nursing notes.  Pertinent labs & imaging results that were available during my care of the patient were reviewed by me and considered in my medical decision making (see chart for details).     Final Clinical Impressions(s) / UC Diagnoses   Final diagnoses:  Tonsillitis    New Prescriptions New Prescriptions   AMOXICILLIN (AMOXIL) 875 MG TABLET    Take 1 tablet (875 mg total) by mouth 2 (two) times daily.   PREDNISONE (DELTASONE) 20 MG TABLET    Two daily with food     Robyn Haber, MD 06/08/16 1349

## 2016-06-08 NOTE — Discharge Instructions (Signed)
Please return if you're not improving by Sunday.

## 2016-07-10 ENCOUNTER — Ambulatory Visit: Payer: Self-pay | Admitting: Obstetrics

## 2016-07-12 ENCOUNTER — Ambulatory Visit (INDEPENDENT_AMBULATORY_CARE_PROVIDER_SITE_OTHER): Payer: 59 | Admitting: Obstetrics

## 2016-07-12 ENCOUNTER — Encounter: Payer: Self-pay | Admitting: Obstetrics

## 2016-07-12 DIAGNOSIS — N898 Other specified noninflammatory disorders of vagina: Secondary | ICD-10-CM

## 2016-07-12 DIAGNOSIS — Z113 Encounter for screening for infections with a predominantly sexual mode of transmission: Secondary | ICD-10-CM | POA: Diagnosis not present

## 2016-07-12 DIAGNOSIS — Z1151 Encounter for screening for human papillomavirus (HPV): Secondary | ICD-10-CM

## 2016-07-12 DIAGNOSIS — Z01419 Encounter for gynecological examination (general) (routine) without abnormal findings: Secondary | ICD-10-CM | POA: Diagnosis not present

## 2016-07-12 DIAGNOSIS — Z124 Encounter for screening for malignant neoplasm of cervix: Secondary | ICD-10-CM | POA: Diagnosis not present

## 2016-07-12 DIAGNOSIS — A6004 Herpesviral vulvovaginitis: Secondary | ICD-10-CM

## 2016-07-12 DIAGNOSIS — B3731 Acute candidiasis of vulva and vagina: Secondary | ICD-10-CM

## 2016-07-12 DIAGNOSIS — I10 Essential (primary) hypertension: Secondary | ICD-10-CM

## 2016-07-12 DIAGNOSIS — N946 Dysmenorrhea, unspecified: Secondary | ICD-10-CM

## 2016-07-12 DIAGNOSIS — Z3041 Encounter for surveillance of contraceptive pills: Secondary | ICD-10-CM

## 2016-07-12 DIAGNOSIS — B373 Candidiasis of vulva and vagina: Secondary | ICD-10-CM

## 2016-07-12 MED ORDER — METRONIDAZOLE 0.75 % VA GEL: 1.0000 | Freq: Two times a day (BID) | VAGINAL | 2 refills | Status: DC

## 2016-07-12 MED ORDER — NORGESTIM-ETH ESTRAD TRIPHASIC 0.18/0.215/0.25 MG-25 MCG PO TABS: 1.0000 | ORAL_TABLET | Freq: Every day | ORAL | 11 refills | Status: DC

## 2016-07-12 MED ORDER — FLUCONAZOLE 200 MG PO TABS: ORAL_TABLET | ORAL | 2 refills | Status: DC

## 2016-07-12 MED ORDER — IBUPROFEN 800 MG PO TABS: 800.0000 mg | ORAL_TABLET | Freq: Three times a day (TID) | ORAL | 5 refills | Status: DC | PRN

## 2016-07-12 MED ORDER — HYDROCHLOROTHIAZIDE 25 MG PO TABS: 25.0000 mg | ORAL_TABLET | Freq: Every day | ORAL | 11 refills | Status: DC

## 2016-07-12 MED ORDER — VALTREX 500 MG PO TABS: ORAL_TABLET | ORAL | 99 refills | Status: DC

## 2016-07-12 NOTE — Progress Notes (Signed)
Pt presents for annual, pap, and STD blood work.  

## 2016-07-12 NOTE — Addendum Note (Signed)
Addended by: Tristan Schroeder D on: 07/12/2016 01:27 PM   Modules accepted: Orders

## 2016-07-12 NOTE — Progress Notes (Signed)
Subjective:        Amanda Cox is a 41 y.o. female here for a routine exam.  Current complaints: None.    Personal health questionnaire:  Is patient Amanda Cox, have a family history of breast and/or ovarian cancer: no Is there a family history of uterine cancer diagnosed at age < 44, gastrointestinal cancer, urinary tract cancer, family member who is a Field seismologist syndrome-associated carrier: no Is the patient overweight and hypertensive, family history of diabetes, personal history of gestational diabetes, preeclampsia or PCOS: no Is patient over 34, have PCOS,  family history of premature CHD under age 2, diabetes, smoke, have hypertension or peripheral artery disease:  no At any time, has a partner hit, kicked or otherwise hurt or frightened you?: no Over the past 2 weeks, have you felt down, depressed or hopeless?: no Over the past 2 weeks, have you felt little interest or pleasure in doing things?:no   Gynecologic History Patient's last menstrual period was 06/22/2016. Contraception: OCP (estrogen/progesterone) Last Pap: 2017. Results were: normal Last mammogram: n/a. Results were: n/a  Obstetric History OB History  Gravida Para Term Preterm AB Living  2 1 1   1 1   SAB TAB Ectopic Multiple Live Births    1     1    # Outcome Date GA Lbr Len/2nd Weight Sex Delivery Anes PTL Lv  2 Term 07/22/01 [redacted]w[redacted]d  7 lb (3.175 kg) F CS-LTranv EPI N LIV  1 TAB               Past Medical History:  Diagnosis Date  . Dyspareunia   . Essential hypertension, benign   . Genital herpes, unspecified   . Irregular menstrual cycle   . Surveillance of previously prescribed contraceptive pill     Past Surgical History:  Procedure Laterality Date  . BREAST REDUCTION SURGERY    . CESAREAN SECTION       Current Outpatient Prescriptions:  .  fluconazole (DIFLUCAN) 200 MG tablet, 1 tablet po every other day prn., Disp: 3 tablet, Rfl: 2 .  hydrochlorothiazide (HYDRODIURIL) 25 MG  tablet, Take 1 tablet (25 mg total) by mouth daily., Disp: 30 tablet, Rfl: 11 .  ibuprofen (ADVIL,MOTRIN) 800 MG tablet, Take 1 tablet (800 mg total) by mouth every 8 (eight) hours as needed., Disp: 30 tablet, Rfl: 5 .  Norgestimate-Ethinyl Estradiol Triphasic 0.18/0.215/0.25 MG-25 MCG tab, Take 1 tablet by mouth daily., Disp: 28 tablet, Rfl: 11 .  VALTREX 500 MG tablet, TAKE 1 TABLET (500 MG TOTAL) BY MOUTH DAILY., Disp: 30 tablet, Rfl: prn .  amoxicillin (AMOXIL) 875 MG tablet, Take 1 tablet (875 mg total) by mouth 2 (two) times daily. (Patient not taking: Reported on 07/12/2016), Disp: 20 tablet, Rfl: 0 .  guaiFENesin-codeine 100-10 MG/5ML syrup, Take 5 mLs by mouth 3 (three) times daily as needed for cough. (Patient not taking: Reported on 07/12/2016), Disp: 120 mL, Rfl: 0 .  ipratropium (ATROVENT) 0.06 % nasal spray, Place 2 sprays into both nostrils 4 (four) times daily. (Patient not taking: Reported on 07/12/2016), Disp: 15 mL, Rfl: 0 .  metroNIDAZOLE (METROGEL VAGINAL) 0.75 % vaginal gel, Place 1 Applicatorful vaginally 2 (two) times daily., Disp: 70 g, Rfl: 2 .  predniSONE (DELTASONE) 20 MG tablet, Two daily with food (Patient not taking: Reported on 07/12/2016), Disp: 10 tablet, Rfl: 0 Allergies  Allergen Reactions  . Sulfa Antibiotics Shortness Of Breath    Social History  Substance Use Topics  . Smoking  status: Never Smoker  . Smokeless tobacco: Never Used  . Alcohol use 0.0 oz/week     Comment: occas.    Family History  Problem Relation Age of Onset  . Diabetes Mother   . Hyperlipidemia Mother   . Arthritis Mother   . Diabetes Sister   . Arthritis Paternal Grandmother   . Congenital heart disease Unknown       Review of Systems  Constitutional: negative for fatigue and weight loss Respiratory: negative for cough and wheezing Cardiovascular: negative for chest pain, fatigue and palpitations Gastrointestinal: negative for abdominal pain and change in bowel  habits Musculoskeletal:negative for myalgias Neurological: negative for gait problems and tremors Behavioral/Psych: negative for abusive relationship, depression Endocrine: negative for temperature intolerance    Genitourinary:negative for abnormal menstrual periods, genital lesions, hot flashes, sexual problems and vaginal discharge Integument/breast: negative for breast lump, breast tenderness, nipple discharge and skin lesion(s)    Objective:       BP (!) 140/93   Pulse 85   Ht 5\' 3"  (1.6 m)   Wt 216 lb 12.8 oz (98.3 kg)   LMP 06/22/2016   BMI 38.40 kg/m  General:   alert  Skin:   no rash or abnormalities  Lungs:   clear to auscultation bilaterally  Heart:   regular rate and rhythm, S1, S2 normal, no murmur, click, rub or gallop  Breasts:   normal without suspicious masses, skin or nipple changes or axillary nodes  Abdomen:  normal findings: no organomegaly, soft, non-tender and no hernia  Pelvis:  External genitalia: normal general appearance Urinary system: urethral meatus normal and bladder without fullness, nontender Vaginal: normal without tenderness, induration or masses Cervix: normal appearance Adnexa: normal bimanual exam Uterus: anteverted and non-tender, normal size   Lab Review Urine pregnancy test Labs reviewed yes Radiologic studies reviewed no  50% of 20 min visit spent on counseling and coordination of care.    Assessment:    Healthy female exam.    Contraceptive Surveillance.  Pleased with OCP's.   Plan:    Education reviewed: calcium supplements, depression evaluation, low fat, low cholesterol diet, safe sex/STD prevention, self breast exams and weight bearing exercise. Contraception: OCP (estrogen/progesterone). Follow up in: 1 year.   Meds ordered this encounter  Medications  . hydrochlorothiazide (HYDRODIURIL) 25 MG tablet    Sig: Take 1 tablet (25 mg total) by mouth daily.    Dispense:  30 tablet    Refill:  11  . VALTREX 500 MG tablet     Sig: TAKE 1 TABLET (500 MG TOTAL) BY MOUTH DAILY.    Dispense:  30 tablet    Refill:  prn  . Norgestimate-Ethinyl Estradiol Triphasic 0.18/0.215/0.25 MG-25 MCG tab    Sig: Take 1 tablet by mouth daily.    Dispense:  28 tablet    Refill:  11  . ibuprofen (ADVIL,MOTRIN) 800 MG tablet    Sig: Take 1 tablet (800 mg total) by mouth every 8 (eight) hours as needed.    Dispense:  30 tablet    Refill:  5  . fluconazole (DIFLUCAN) 200 MG tablet    Sig: 1 tablet po every other day prn.    Dispense:  3 tablet    Refill:  2  . metroNIDAZOLE (METROGEL VAGINAL) 0.75 % vaginal gel    Sig: Place 1 Applicatorful vaginally 2 (two) times daily.    Dispense:  70 g    Refill:  2   Orders Placed This Encounter  Procedures  .  Hepatitis B surface antigen  . Hepatitis C antibody  . HIV antibody  . RPR      Patient ID: Amanda Cox, female   DOB: 1975-10-02, 41 y.o.   MRN: [id number]

## 2016-07-13 LAB — CERVICOVAGINAL ANCILLARY ONLY
Bacterial vaginitis: NEGATIVE
CANDIDA VAGINITIS: NEGATIVE
CHLAMYDIA, DNA PROBE: NEGATIVE
NEISSERIA GONORRHEA: NEGATIVE
TRICH (WINDOWPATH): NEGATIVE

## 2016-07-13 LAB — HIV ANTIBODY (ROUTINE TESTING W REFLEX): HIV Screen 4th Generation wRfx: NONREACTIVE

## 2016-07-13 LAB — RPR: RPR Ser Ql: NONREACTIVE

## 2016-07-13 LAB — HEPATITIS C ANTIBODY

## 2016-07-13 LAB — HEPATITIS B SURFACE ANTIGEN: HEP B S AG: NEGATIVE

## 2016-07-18 LAB — CYTOLOGY - PAP
ADEQUACY: ABSENT
DIAGNOSIS: NEGATIVE
HPV: NOT DETECTED

## 2016-08-17 ENCOUNTER — Ambulatory Visit: Payer: 59 | Admitting: Obstetrics

## 2016-09-08 ENCOUNTER — Ambulatory Visit (HOSPITAL_COMMUNITY)
Admission: EM | Admit: 2016-09-08 | Discharge: 2016-09-08 | Disposition: A | Discharge status: Home / Self Care | Payer: 59 | Attending: Internal Medicine | Admitting: Internal Medicine

## 2016-09-08 ENCOUNTER — Encounter (HOSPITAL_COMMUNITY): Payer: Self-pay | Admitting: Physician Assistant

## 2016-09-08 DIAGNOSIS — R51 Headache: Secondary | ICD-10-CM

## 2016-09-08 DIAGNOSIS — R519 Headache, unspecified: Secondary | ICD-10-CM

## 2016-09-08 NOTE — ED Provider Notes (Signed)
CSN: [id number]     Arrival date & time 09/08/16  1002 History   None    Chief Complaint  Patient presents with  . Headache   (Consider location/radiation/quality/duration/timing/severity/associated sxs/prior Treatment) 41 year old female with history of hypertension comes in with 1 week history of left temporal pressure. She states she has not tried anything, because it does not feel like a headache. She has associated vision change with the symptoms. Pressure comes and goes. Patient states she has a neurologist, pressure what she was seen for in the past, but needs a referral to be able to see them again. Denies photophobia, phonophobia, nausea, vomiting. Denies URI symptoms such as fever, cough, sinus pressure. Denies weakness, syncope, dizziness. Denies head injury.      Past Medical History:  Diagnosis Date  . Dyspareunia   . Essential hypertension, benign   . Genital herpes, unspecified   . Irregular menstrual cycle   . Surveillance of previously prescribed contraceptive pill    Past Surgical History:  Procedure Laterality Date  . BREAST REDUCTION SURGERY    . CESAREAN SECTION     Family History  Problem Relation Age of Onset  . Diabetes Mother   . Hyperlipidemia Mother   . Arthritis Mother   . Diabetes Sister   . Arthritis Paternal Grandmother   . Congenital heart disease Unknown    Social History  Substance Use Topics  . Smoking status: Never Smoker  . Smokeless tobacco: Never Used  . Alcohol use 0.0 oz/week     Comment: occas.   OB History    Gravida Para Term Preterm AB Living   2 1 1   1 1    SAB TAB Ectopic Multiple Live Births     1     1     Review of Systems  Reason unable to perform ROS: History of present illness as above.    Allergies  Sulfa antibiotics  Home Medications   Prior to Admission medications   Medication Sig Start Date End Date Taking? Authorizing Provider  amoxicillin (AMOXIL) 875 MG tablet Take 1 tablet (875 mg total) by  mouth 2 (two) times daily. Patient not taking: Reported on 07/12/2016 06/08/16   Robyn Haber, MD  fluconazole (DIFLUCAN) 200 MG tablet 1 tablet po every other day prn. 07/12/16   [name], MD  guaiFENesin-codeine 100-10 MG/5ML syrup Take 5 mLs by mouth 3 (three) times daily as needed for cough. Patient not taking: Reported on 07/12/2016 01/28/16   Hedges, Dellis Filbert, PA-C  hydrochlorothiazide (HYDRODIURIL) 25 MG tablet Take 1 tablet (25 mg total) by mouth daily. 07/12/16   [name], MD  ibuprofen (ADVIL,MOTRIN) 800 MG tablet Take 1 tablet (800 mg total) by mouth every 8 (eight) hours as needed. 07/12/16   [name], MD  ipratropium (ATROVENT) 0.06 % nasal spray Place 2 sprays into both nostrils 4 (four) times daily. Patient not taking: Reported on 07/12/2016 01/27/16   Janne Napoleon, NP  metroNIDAZOLE (METROGEL VAGINAL) 0.75 % vaginal gel Place 1 Applicatorful vaginally 2 (two) times daily. 07/12/16   [name], MD  Norgestimate-Ethinyl Estradiol Triphasic 0.18/0.215/0.25 MG-25 MCG tab Take 1 tablet by mouth daily. 07/12/16   [name], MD  predniSONE (DELTASONE) 20 MG tablet Two daily with food Patient not taking: Reported on 07/12/2016 06/08/16   Robyn Haber, MD  VALTREX 500 MG tablet TAKE 1 TABLET (500 MG TOTAL) BY MOUTH DAILY. 07/12/16   [name], MD   Meds Ordered  and Administered this Visit  Medications - No data to display  BP 140/82 (BP Location: Right Arm)   Pulse 78   Temp 98.6 F (37 C) (Oral)   Resp 18   LMP 09/01/2016   SpO2 98%  No data found.   Physical Exam  Constitutional: She is oriented to person, place, and time. She appears well-developed and well-nourished. No distress.  HENT:  Head: Normocephalic and atraumatic.  Nose: Nose normal. Right sinus exhibits no maxillary sinus tenderness and no frontal sinus tenderness. Left sinus exhibits no maxillary sinus tenderness and no frontal sinus tenderness.  Mouth/Throat: Uvula is  midline, oropharynx is clear and moist and mucous membranes are normal.  No temporal tenderness, no bruits heard at the temporal artery.   Eyes: Pupils are equal, round, and reactive to light. Conjunctivae and EOM are normal.  Neck: Normal range of motion. Neck supple.  Cardiovascular: Normal rate, regular rhythm and normal heart sounds.  Exam reveals no gallop and no friction rub.   No murmur heard. Pulmonary/Chest: Effort normal and breath sounds normal. She has no wheezes. She has no rales.  Neurological: She is alert and oriented to person, place, and time. She has normal strength. No cranial nerve deficit or sensory deficit. She displays a negative Romberg sign.  Skin: Skin is warm and dry.  Psychiatric: She has a normal mood and affect. Her behavior is normal. Judgment normal.    Urgent Care Course     Procedures (including critical care time)  Labs Review Labs Reviewed - No data to display  Imaging Review No results found.     MDM   1. Intractable episodic headache, unspecified headache type    Discussed with patient symptoms could be due to atypical migraine. Patient with normal neurology exam today. Given patient has not tried any medication, will start with Tylenol or Motrin for the discomfort. Patient to follow up with her neurologist for further workup. Given patient's age, low suspicion for temporal arteritis at this time. Patient to monitor for worsening of symptoms, such as photophobia, phonophobia, nausea, vomiting, follow-up for reevaluation.   Ok Edwards, PA-C 09/08/16 1257

## 2016-09-08 NOTE — ED Triage Notes (Signed)
Pt  Reports     Pain  l  Temple   Area     X   1  Week   -  Pt   Denies  Any  Injury     -    Pt    Reports    Has  A  History   Of  htn       He is  Awake  And  Alert    And  Oriented

## 2016-09-08 NOTE — Discharge Instructions (Signed)
Your symptoms could be explained as a migraine, try taking tylenol/motrin for the discomfort and see if it relieves your symptoms. Follow up with neurologist for further workup.

## 2016-09-11 ENCOUNTER — Telehealth: Payer: Self-pay | Admitting: Neurology

## 2016-09-11 NOTE — Telephone Encounter (Signed)
Patient of Dr. Brett Fairy l/[id number] was seen in the ER on 08/2016 for headaches and was told to f/u w neurology, pt wants to sewitch to Dr. Jannifer Franklin is this ok?

## 2016-09-11 NOTE — Telephone Encounter (Signed)
The patient has not been seen through this office in greater than 3 years, this is essentially a new patient referral.  Okay with evaluation of the patient.

## 2016-09-11 NOTE — Telephone Encounter (Signed)
Absolutely fine with me

## 2016-11-08 ENCOUNTER — Encounter: Payer: Self-pay | Admitting: Neurology

## 2016-11-08 ENCOUNTER — Ambulatory Visit (INDEPENDENT_AMBULATORY_CARE_PROVIDER_SITE_OTHER): Payer: 59 | Admitting: Neurology

## 2016-11-08 DIAGNOSIS — F5104 Psychophysiologic insomnia: Secondary | ICD-10-CM

## 2016-11-08 DIAGNOSIS — G4489 Other headache syndrome: Secondary | ICD-10-CM

## 2016-11-08 HISTORY — DX: Other headache syndrome: G44.89

## 2016-11-08 HISTORY — DX: Psychophysiologic insomnia: F51.04

## 2016-11-08 MED ORDER — SUMATRIPTAN SUCCINATE 100 MG PO TABS: 100.0000 mg | ORAL_TABLET | Freq: Two times a day (BID) | ORAL | 3 refills | Status: DC | PRN

## 2016-11-08 NOTE — Patient Instructions (Signed)
   We will get a CT of the head and try benadryl at night for sleep.  Take Imitrex 100 mg if needed for the head pressure.

## 2016-11-08 NOTE — Progress Notes (Signed)
Reason for visit: Headache  Referring physician: Raemon  Cox Amanda Cox is a 41 y.o. female  History of present illness:  Amanda Cox is a 41 year old right-handed black female with a history of headaches that began in July 2018. The patient went to the emergency room on July 20 with a problem with bitemporal pressure sensations. This was going on for several days prior to her emergency room visit. Since that time, the episodes have become less frequent but are occurring about once a week. The patient has chronic insomnia, she will take NyQuil at night to help her rest, she notes that if she does not sleep well she is more likely to have a headache the next day. The headaches last all day long. This is unassociated with visual changes, numbness or weakness, or any photophobia or phonophobia. The patient denies any cognitive changes. The headache is fairly mild, the patient usually does not take anything for the pain, but she has taken 800 mg ibuprofen previously without benefit. She denies any neck stiffness. She denies issues controlling the bowels or the bladder or problems with balance. She denies any dizziness. Her sister has significant problems with migraine headache. She comes to this office for an evaluation.  Past Medical History:  Diagnosis Date  . Dyspareunia   . Essential hypertension, benign   . Genital herpes, unspecified   . Irregular menstrual cycle   . Surveillance of previously prescribed contraceptive pill     Past Surgical History:  Procedure Laterality Date  . BREAST REDUCTION SURGERY    . CESAREAN SECTION      Family History  Problem Relation Age of Onset  . Diabetes Mother   . Hyperlipidemia Mother   . Arthritis Mother   . Diabetes Sister   . Arthritis Paternal Grandmother   . Congenital heart disease Unknown     Social history:  reports that she has never smoked. She has never used smokeless tobacco. She reports that she drinks alcohol. She reports  that she does not use drugs.  Medications:  Prior to Admission medications   Medication Sig Start Date End Date Taking? Authorizing Provider  fluconazole (DIFLUCAN) 200 MG tablet 1 tablet po every other day prn. 07/12/16  Yes [name], MD  hydrochlorothiazide (HYDRODIURIL) 25 MG tablet Take 1 tablet (25 mg total) by mouth daily. 07/12/16  Yes [name], MD  ibuprofen (ADVIL,MOTRIN) 800 MG tablet Take 1 tablet (800 mg total) by mouth every 8 (eight) hours as needed. 07/12/16  Yes [name], MD  metroNIDAZOLE (METROGEL VAGINAL) 0.75 % vaginal gel Place 1 Applicatorful vaginally 2 (two) times daily. 07/12/16  Yes [name], MD  Norgestimate-Ethinyl Estradiol Triphasic 0.18/0.215/0.25 MG-25 MCG tab Take 1 tablet by mouth daily. 07/12/16  Yes [name], MD  VALTREX 500 MG tablet TAKE 1 TABLET (500 MG TOTAL) BY MOUTH DAILY. 07/12/16  Yes [name], MD      Allergies  Allergen Reactions  . Sulfa Antibiotics Shortness Of Breath    ROS:  Out of a complete 14 system review of symptoms, the patient complains only of the following symptoms, and all other reviewed systems are negative.  Depression, anxiety, not enough sleep Insomnia  Blood pressure 119/85, pulse 72, height 5\' 3"  (1.6 m), weight 201 lb (91.2 kg).  Physical Exam  General: The patient is alert and cooperative at the time of the examination. The patient is moderately obese.  Eyes: Pupils are equal, round,  and reactive to light. Discs are flat bilaterally.  Neck: The neck is supple, no carotid bruits are noted.  Respiratory: The respiratory examination is clear.  Cardiovascular: The cardiovascular examination reveals a regular rate and rhythm, no obvious murmurs or rubs are noted.   Neuromuscular: Range of movement of the cervical spine is full, no crepitus is noted in the temporomandibular joints.  Skin: Extremities are without significant edema.  Neurologic Exam  Mental status:  The patient is alert and oriented x 3 at the time of the examination. The patient has apparent normal recent and remote memory, with an apparently normal attention span and concentration ability.  Cranial nerves: Facial symmetry is present. There is good sensation of the face to pinprick and soft touch bilaterally. The strength of the facial muscles and the muscles to head turning and shoulder shrug are normal bilaterally. Speech is well enunciated, no aphasia or dysarthria is noted. Extraocular movements are full. Visual fields are full. The tongue is midline, and the patient has symmetric elevation of the soft palate. No obvious hearing deficits are noted.  Motor: The motor testing reveals 5 over 5 strength of all 4 extremities. Good symmetric motor tone is noted throughout.  Sensory: Sensory testing is intact to pinprick, soft touch, vibration sensation, and position sense on all 4 extremities. No evidence of extinction is noted.  Coordination: Cerebellar testing reveals good finger-nose-finger and heel-to-shin bilaterally.  Gait and station: Gait is normal. Tandem gait is normal. Romberg is negative. No drift is seen.  Reflexes: Deep tendon reflexes are symmetric and normal bilaterally. Toes are downgoing bilaterally.   Assessment/Plan:  1. Bitemporal headache  2. Chronic insomnia  The patient is having episodes of bitemporal pressure sensations. There is a significant family history for migraine. The patient will be sent for CT of the brain. She will take Benadryl at night for sleep. If this is not effective, we may use trazodone. The patient has related the headaches to episodes of insomnia. The patient will be given Imitrex to take if needed for the headache. She will follow-up in 3 months.  Jill Alexanders MD 11/08/2016 9:43 AM  [facility] [address] [place], [place] [address]  Phone [phone] Fax [phone]

## 2016-11-24 ENCOUNTER — Ambulatory Visit
Admission: RE | Admit: 2016-11-24 | Discharge: 2016-11-24 | Disposition: A | Discharge status: Home / Self Care | Payer: 59 | Source: Ambulatory Visit | Attending: Neurology | Admitting: Neurology

## 2016-11-24 ENCOUNTER — Telehealth: Payer: Self-pay | Admitting: Neurology

## 2016-11-24 DIAGNOSIS — G4489 Other headache syndrome: Secondary | ICD-10-CM

## 2016-11-24 NOTE — Telephone Encounter (Signed)
I called patient. CT of the brain was unremarkable, no evidence of sinus disease. I discussed this with patient.   CT brain 11/24/16:  IMPRESSION:  This is a normal CT scan of the head without contrast.

## 2016-12-01 ENCOUNTER — Other Ambulatory Visit: Payer: Self-pay | Admitting: Obstetrics

## 2016-12-01 ENCOUNTER — Telehealth: Payer: Self-pay

## 2016-12-01 DIAGNOSIS — B3731 Acute candidiasis of vulva and vagina: Secondary | ICD-10-CM

## 2016-12-01 DIAGNOSIS — B373 Candidiasis of vulva and vagina: Secondary | ICD-10-CM

## 2016-12-01 MED ORDER — FLUCONAZOLE 200 MG PO TABS: ORAL_TABLET | ORAL | 2 refills | Status: DC

## 2016-12-01 NOTE — Telephone Encounter (Signed)
Pt informed

## 2016-12-01 NOTE — Telephone Encounter (Signed)
Diflucan Rx 

## 2016-12-01 NOTE — Telephone Encounter (Signed)
Pt called requesting diflucan with 3 refills. States that she has vaginal itching, and took the in home test kit to check for yeast. States that this showed that she had a yeast infection. I explained to pt that I could send her in one pill, but 3 pills would have to be authorized by provider. Pt states that she was willing to wait for authorization for 3 tablets.

## 2016-12-04 ENCOUNTER — Other Ambulatory Visit: Payer: Self-pay | Admitting: Obstetrics

## 2016-12-04 DIAGNOSIS — Z3041 Encounter for surveillance of contraceptive pills: Secondary | ICD-10-CM

## 2016-12-21 ENCOUNTER — Ambulatory Visit (HOSPITAL_COMMUNITY)
Admission: EM | Admit: 2016-12-21 | Discharge: 2016-12-21 | Disposition: A | Discharge status: Home / Self Care | Payer: 59 | Attending: Emergency Medicine | Admitting: Emergency Medicine

## 2016-12-21 ENCOUNTER — Encounter (HOSPITAL_COMMUNITY): Payer: Self-pay | Admitting: Emergency Medicine

## 2016-12-21 DIAGNOSIS — N760 Acute vaginitis: Secondary | ICD-10-CM

## 2016-12-21 DIAGNOSIS — N898 Other specified noninflammatory disorders of vagina: Secondary | ICD-10-CM | POA: Insufficient documentation

## 2016-12-21 DIAGNOSIS — L739 Follicular disorder, unspecified: Secondary | ICD-10-CM | POA: Diagnosis not present

## 2016-12-21 MED ORDER — METRONIDAZOLE 0.75 % VA GEL: 1.0000 | Freq: Every day | VAGINAL | 0 refills | Status: AC

## 2016-12-21 NOTE — ED Provider Notes (Signed)
[facility]    CSN: [id number] Arrival date & time: 12/21/16  1708     History   Chief Complaint Chief Complaint  Patient presents with  . Vaginal Itching    HPI Amanda Cox is a 41 y.o. female.   Amanda Cox presents with complaints of vaginal itching which is internal and external to vulva. She states she is also concerned because she felt external bumps to the labia and possibly rectum. She has had herpes as well as genital warts in the past. She has been taking valtrex which has not helped. She denies any urinary symptoms, abdominal pain or fevers. She has minimal vaginal discharge. She is sexually active with 1 partner, does not use condoms. She tried using metro gel approximately 2 weeks ago followed by diflucan, both which minimally helped her symptoms. The bumps are mildly tender, without drainage.   ROS per HPI.       Past Medical History:  Diagnosis Date  . Chronic insomnia 11/08/2016  . Dyspareunia   . Essential hypertension, benign   . Genital herpes, unspecified   . Headache syndrome 11/08/2016  . Irregular menstrual cycle   . Surveillance of previously prescribed contraceptive pill     Patient Active Problem List   Diagnosis Date Noted  . Headache syndrome 11/08/2016  . Chronic insomnia 11/08/2016  . Snoring 08/19/2013  . Obesity, unspecified 08/19/2013  . Screening examination for venereal disease 07/10/2013  . Essential hypertension, benign 07/10/2013  . Urinary tract infection, site not specified 07/10/2013  . Candidiasis of vulva and vagina 07/10/2013  . Genital herpes 07/17/2012  . BV (bacterial vaginosis) 07/17/2012    Past Surgical History:  Procedure Laterality Date  . BREAST REDUCTION SURGERY    . CESAREAN SECTION      OB History    Gravida Para Term Preterm AB Living   2 1 1   1 1    SAB TAB Ectopic Multiple Live Births     1     1       Home Medications    Prior to Admission medications   Medication Sig Start Date  End Date Taking? Authorizing Provider  hydrochlorothiazide (HYDRODIURIL) 25 MG tablet Take 1 tablet (25 mg total) by mouth daily. 07/12/16  Yes [name], MD  TRI-LO-MARZIA 0.18/0.215/0.25 MG-25 MCG tab TAKE 1 TABLET BY MOUTH DAILY. 12/04/16  Yes [name], MD  fluconazole (DIFLUCAN) 200 MG tablet 1 tablet po every other day prn. 12/01/16   [name], MD  ibuprofen (ADVIL,MOTRIN) 800 MG tablet Take 1 tablet (800 mg total) by mouth every 8 (eight) hours as needed. 07/12/16   [name], MD  metroNIDAZOLE (METROGEL VAGINAL) 0.75 % vaginal gel Place 1 Applicatorful vaginally at bedtime. 12/21/16 12/26/16  Zigmund Gottron, NP  Norgestimate-Ethinyl Estradiol Triphasic 0.18/0.215/0.25 MG-25 MCG tab Take 1 tablet by mouth daily. 07/12/16   [name], MD  SUMAtriptan (IMITREX) 100 MG tablet Take 1 tablet (100 mg total) by mouth 2 (two) times daily as needed for migraine. 11/08/16   Kathrynn Ducking, MD  VALTREX 500 MG tablet TAKE 1 TABLET (500 MG TOTAL) BY MOUTH DAILY. 07/12/16   [name], MD    Family History Family History  Problem Relation Age of Onset  . Diabetes Mother   . Hyperlipidemia Mother   . Arthritis Mother   . Diabetes Sister   . Arthritis Paternal Grandmother   . Congenital heart disease Unknown  Social History Social History  Substance Use Topics  . Smoking status: Never Smoker  . Smokeless tobacco: Never Used  . Alcohol use 0.0 oz/week     Comment: rare- once every 3 months     Allergies   Sulfa antibiotics   Review of Systems Review of Systems   Physical Exam Triage Vital Signs ED Triage Vitals [12/21/16 1728]  Enc Vitals Group     BP (!) 140/91     Pulse Rate 91     Resp 14     Temp 98.5 F (36.9 C)     Temp src      SpO2 100 %     Weight      Height      Head Circumference      Peak Flow      Pain Score      Pain Loc      Pain Edu?      Excl. in [place]?    No data found.   Updated Vital Signs BP (!)  140/91   Pulse 91   Temp 98.5 F (36.9 C)   Resp 14   LMP 12/07/2016   SpO2 100%   Visual Acuity Right Eye Distance:   Left Eye Distance:   Bilateral Distance:    Right Eye Near:   Left Eye Near:    Bilateral Near:     Physical Exam  Constitutional: She appears well-developed and well-nourished.  Cardiovascular: Normal rate.   Pulmonary/Chest: Effort normal and breath sounds normal.  Abdominal: Soft. There is no tenderness.  Genitourinary: Rectal exam shows no mass and no tenderness.    Vaginal discharge found.  Genitourinary Comments: Two small raised hair follicles, without tenderness, is not open lesion, no drainage; without surrounding tissue drainage; without palpable abscess, mildly firm; rectum without lesions or sores; thin white discharge noted at vaginal opening; internal labia without rash redness or lesions  Skin: Skin is warm and dry.  Vitals reviewed.    UC Treatments / Results  Labs (all labs ordered are listed, but only abnormal results are displayed) Labs Reviewed  URINE CYTOLOGY ANCILLARY ONLY    EKG  EKG Interpretation None       Radiology No results found.  Procedures Procedures (including critical care time)  Medications Ordered in UC Medications - No data to display   Initial Impression / Assessment and Plan / UC Course  I have reviewed the triage vital signs and the nursing notes.  Pertinent labs & imaging results that were available during my care of the patient were reviewed by me and considered in my medical decision making (see chart for details).     Lesions are not consistent with herpes or genital warts at this time, and have not responded to valtrex as they typically do for patient. Appearance consistent with folliculitis. Warm compresses recommended, keep area clean and dry. Initiating treatment of metrogel at this time, will await urine results, and will notify patient of any change to plan or positive findings. If  symptoms worsen or do not improve in the next week to return to be seen or to follow up with PCP. Patient verbalized understanding and agreeable to plan.       Final Clinical Impressions(s) / UC Diagnoses   Final diagnoses:  Acute vaginitis    New Prescriptions New Prescriptions   METRONIDAZOLE (METROGEL VAGINAL) 0.75 % VAGINAL GEL    Place 1 Applicatorful vaginally at bedtime.     Controlled Substance Prescriptions [place]  Controlled Substance Registry consulted? Not Applicable   Zigmund Gottron, NP 12/21/16 1031

## 2016-12-21 NOTE — ED Triage Notes (Signed)
Pt c/o vaginal itching 

## 2016-12-22 LAB — URINE CYTOLOGY ANCILLARY ONLY
CHLAMYDIA, DNA PROBE: NEGATIVE
Neisseria Gonorrhea: NEGATIVE
Trichomonas: NEGATIVE

## 2016-12-25 LAB — URINE CYTOLOGY ANCILLARY ONLY
Bacterial vaginitis: NEGATIVE
CANDIDA VAGINITIS: NEGATIVE

## 2017-02-02 ENCOUNTER — Ambulatory Visit: Payer: 59 | Admitting: Nurse Practitioner

## 2017-05-28 ENCOUNTER — Encounter: Payer: Self-pay | Admitting: Internal Medicine

## 2017-06-15 ENCOUNTER — Ambulatory Visit (HOSPITAL_COMMUNITY)
Admission: EM | Admit: 2017-06-15 | Discharge: 2017-06-15 | Disposition: A | Discharge status: Home / Self Care | Payer: 59 | Attending: Family Medicine | Admitting: Family Medicine

## 2017-06-15 ENCOUNTER — Encounter (HOSPITAL_COMMUNITY): Payer: Self-pay | Admitting: Family Medicine

## 2017-06-15 DIAGNOSIS — I10 Essential (primary) hypertension: Secondary | ICD-10-CM | POA: Diagnosis not present

## 2017-06-15 DIAGNOSIS — K5909 Other constipation: Secondary | ICD-10-CM | POA: Diagnosis present

## 2017-06-15 DIAGNOSIS — K5904 Chronic idiopathic constipation: Secondary | ICD-10-CM

## 2017-06-15 DIAGNOSIS — Z882 Allergy status to sulfonamides status: Secondary | ICD-10-CM | POA: Insufficient documentation

## 2017-06-15 DIAGNOSIS — Z79899 Other long term (current) drug therapy: Secondary | ICD-10-CM | POA: Diagnosis not present

## 2017-06-15 DIAGNOSIS — G4700 Insomnia, unspecified: Secondary | ICD-10-CM | POA: Insufficient documentation

## 2017-06-15 LAB — TSH: TSH: 0.664 u[IU]/mL (ref 0.350–4.500)

## 2017-06-15 MED ORDER — POLYETHYLENE GLYCOL 3350 17 GM/SCOOP PO POWD: 1.0000 | Freq: Once | ORAL | 0 refills | Status: AC

## 2017-06-15 NOTE — ED Triage Notes (Signed)
Patient states that she has had constipation for a long time due to her cheese consumption, states that she used to eat a lot of eat and is now cutting back. Patient states that patient now has BM once a week for about 6 months and has to use stool softners. Patient states that she has been taken dulcolax  since yesterday and still has not had BM. Patient states she thinks it is because of her diet.

## 2017-06-15 NOTE — Discharge Instructions (Addendum)
Try increasing your daily consumption of fruits and vegetables.

## 2017-06-15 NOTE — ED Provider Notes (Signed)
[facility]   [id number] 06/15/17 Arrival Time: [place]   SUBJECTIVE:  Amanda Cox is a 42 y.o. female who presents to the urgent care with complaint of constipation which is chronic since changing diet 6 months ago from meat based to more cheese based.  Dulcolax yesterday did not help.  Usually Dulcolax works, but after 5 tablets in the last 24 hours, she wants something else.  Does not eat much in the way of fruits or vegetables.   Past Medical History:  Diagnosis Date  . Chronic insomnia 11/08/2016  . Dyspareunia   . Essential hypertension, benign   . Genital herpes, unspecified   . Headache syndrome 11/08/2016  . Irregular menstrual cycle   . Surveillance of previously prescribed contraceptive pill    Family History  Problem Relation Age of Onset  . Diabetes Mother   . Hyperlipidemia Mother   . Arthritis Mother   . Diabetes Sister   . Arthritis Paternal Grandmother   . Congenital heart disease Unknown    Social History   Socioeconomic History  . Marital status: Single    Spouse name: Not on file  . Number of children: 1  . Years of education: College  . Highest education level: Not on file  Occupational History    Employer: [employer]  . Financial resource strain: Not on file  . Food insecurity:    Worry: Not on file    Inability: Not on file  . Transportation needs:    Medical: Not on file    Non-medical: Not on file  Tobacco Use  . Smoking status: Never Smoker  . Smokeless tobacco: Never Used  Substance and Sexual Activity  . Alcohol use: Yes    Alcohol/week: 0.0 oz    Comment: rare- once every 3 months  . Drug use: No  . Sexual activity: Yes    Partners: Male    Birth control/protection: OCP  Lifestyle  . Physical activity:    Days per week: Not on file    Minutes per session: Not on file  . Stress: Not on file  Relationships  . Social connections:    Talks on phone: Not on file    Gets together: Not on file     Attends religious service: Not on file    Active member of club or organization: Not on file    Attends meetings of clubs or organizations: Not on file    Relationship status: Not on file  . Intimate partner violence:    Fear of current or ex partner: Not on file    Emotionally abused: Not on file    Physically abused: Not on file    Forced sexual activity: Not on file  Other Topics Concern  . Not on file  Social History Narrative   Patient is single and lives at home, her daughter lives with her.   Patient has a college education.   Patient is right-handed.   Caffeine use:  Soda once per month   Patient has one child.   Current Meds  Medication Sig  . hydrochlorothiazide (HYDRODIURIL) 25 MG tablet Take 1 tablet (25 mg total) by mouth daily.  Marland Kitchen ibuprofen (ADVIL,MOTRIN) 800 MG tablet Take 1 tablet (800 mg total) by mouth every 8 (eight) hours as needed.  . TRI-LO-MARZIA 0.18/0.215/0.25 MG-25 MCG tab TAKE 1 TABLET BY MOUTH DAILY.   Allergies  Allergen Reactions  . Sulfa Antibiotics Shortness Of Breath      ROS:  As per HPI, remainder of ROS negative.   OBJECTIVE:   Vitals:   06/15/17 1251  BP: 121/78  Pulse: 70  Temp: 98 F (36.7 C)  SpO2: 100%     General appearance: alert; no distress Eyes: PERRL; EOMI; conjunctiva normal HENT: normocephalic; atraumatic; TMs normal, canal normal, external ears normal without trauma; nasal mucosa normal; oral mucosa normal Neck: supple Lungs: clear to auscultation bilaterally Heart: regular rate and rhythm Abdomen: soft, non-tender; bowel sounds normal; no masses or organomegaly; no guarding or rebound tenderness Back: no CVA tenderness Extremities: no cyanosis or edema; symmetrical with no gross deformities Skin: warm and dry Neurologic: normal gait; grossly normal Psychological: alert and cooperative; normal mood and affect      Labs:  Results for orders placed or performed during the hospital encounter of 12/21/16   Urine cytology ancillary only  Result Value Ref Range   Chlamydia Negative    Neisseria gonorrhea Negative    Trichomonas Negative   Urine cytology ancillary only  Result Value Ref Range   Bacterial vaginitis Negative for Bacterial Vaginitis Microorganisms    Candida vaginitis Negative for Candida Vaginitis Microorganisms     Labs Reviewed  TSH    No results found.     ASSESSMENT & PLAN:  1. Chronic idiopathic constipation    Try increasing your daily consumption of fruits and vegetables.  Meds ordered this encounter  Medications  . polyethylene glycol powder (MIRALAX) powder    Sig: Take 255 g by mouth once for 1 dose.    Dispense:  255 g    Refill:  0    Reviewed expectations re: course of current medical issues. Questions answered. Outlined signs and symptoms indicating need for more acute intervention. Patient verbalized understanding. After Visit Summary given.    Procedures:      Robyn Haber, MD 06/15/17 1308

## 2017-06-16 NOTE — Progress Notes (Signed)
Results are within normal range. Attempted to contact pt, no answer at this time.

## 2017-06-18 LAB — POCT I-STAT, CHEM 8
BUN: 8 mg/dL (ref 6–20)
Calcium, Ion: 1.18 mmol/L (ref 1.15–1.40)
Chloride: 99 mmol/L — ABNORMAL LOW (ref 101–111)
Creatinine, Ser: 0.7 mg/dL (ref 0.44–1.00)
Glucose, Bld: 124 mg/dL — ABNORMAL HIGH (ref 65–99)
HCT: 39 % (ref 36.0–46.0)
Hemoglobin: 13.3 g/dL (ref 12.0–15.0)
Potassium: 3.3 mmol/L — ABNORMAL LOW (ref 3.5–5.1)
Sodium: 140 mmol/L (ref 135–145)
TCO2: 29 mmol/L (ref 22–32)

## 2017-07-12 ENCOUNTER — Encounter: Payer: Self-pay | Admitting: Family Medicine

## 2017-07-17 ENCOUNTER — Ambulatory Visit: Payer: 59 | Admitting: Obstetrics

## 2017-07-18 ENCOUNTER — Ambulatory Visit: Payer: 59 | Admitting: Obstetrics

## 2017-07-18 ENCOUNTER — Encounter: Payer: Self-pay | Admitting: Internal Medicine

## 2017-07-20 ENCOUNTER — Other Ambulatory Visit: Payer: Self-pay | Admitting: Obstetrics

## 2017-07-20 DIAGNOSIS — I10 Essential (primary) hypertension: Secondary | ICD-10-CM

## 2017-07-27 ENCOUNTER — Encounter: Payer: Self-pay | Admitting: Obstetrics

## 2017-07-27 ENCOUNTER — Ambulatory Visit (INDEPENDENT_AMBULATORY_CARE_PROVIDER_SITE_OTHER): Payer: 59 | Admitting: Obstetrics

## 2017-07-27 VITALS — BP 128/84 | HR 84 | Ht 63.0 in | Wt 187.0 lb

## 2017-07-27 DIAGNOSIS — Z1151 Encounter for screening for human papillomavirus (HPV): Secondary | ICD-10-CM

## 2017-07-27 DIAGNOSIS — Z124 Encounter for screening for malignant neoplasm of cervix: Secondary | ICD-10-CM | POA: Diagnosis not present

## 2017-07-27 DIAGNOSIS — R319 Hematuria, unspecified: Secondary | ICD-10-CM

## 2017-07-27 DIAGNOSIS — Z3041 Encounter for surveillance of contraceptive pills: Secondary | ICD-10-CM

## 2017-07-27 DIAGNOSIS — Z1239 Encounter for other screening for malignant neoplasm of breast: Secondary | ICD-10-CM

## 2017-07-27 DIAGNOSIS — N898 Other specified noninflammatory disorders of vagina: Secondary | ICD-10-CM

## 2017-07-27 DIAGNOSIS — Z01419 Encounter for gynecological examination (general) (routine) without abnormal findings: Secondary | ICD-10-CM

## 2017-07-27 DIAGNOSIS — Z113 Encounter for screening for infections with a predominantly sexual mode of transmission: Secondary | ICD-10-CM | POA: Diagnosis not present

## 2017-07-27 LAB — POCT URINALYSIS DIPSTICK
BILIRUBIN UA: NEGATIVE
Glucose, UA: NEGATIVE
Ketones, UA: NEGATIVE
LEUKOCYTES UA: NEGATIVE
Nitrite, UA: NEGATIVE
PH UA: 6.5 (ref 5.0–8.0)
Protein, UA: NEGATIVE
SPEC GRAV UA: 1.015 (ref 1.010–1.025)
UROBILINOGEN UA: 0.2 U/dL

## 2017-07-27 MED ORDER — NORGESTIM-ETH ESTRAD TRIPHASIC 0.18/0.215/0.25 MG-25 MCG PO TABS: 1.0000 | ORAL_TABLET | Freq: Every day | ORAL | 11 refills | Status: DC

## 2017-07-27 MED ORDER — FLUCONAZOLE 150 MG PO TABS: 150.0000 mg | ORAL_TABLET | Freq: Once | ORAL | 2 refills | Status: AC

## 2017-07-27 MED ORDER — METRONIDAZOLE 0.75 % VA GEL: 1.0000 | Freq: Two times a day (BID) | VAGINAL | 2 refills | Status: DC

## 2017-07-27 NOTE — Progress Notes (Signed)
Subjective:        Amanda Cox is a 42 y.o. female here for a routine exam.  Current complaints: None.    Personal health questionnaire:  Is patient [name], have a family history of breast and/or ovarian cancer: no Is there a family history of uterine cancer diagnosed at age < 35, gastrointestinal cancer, urinary tract cancer, family member who is a Field seismologist syndrome-associated carrier: no Is the patient overweight and hypertensive, family history of diabetes, personal history of gestational diabetes, preeclampsia or PCOS: no Is patient over 64, have PCOS,  family history of premature CHD under age 25, diabetes, smoke, have hypertension or peripheral artery disease:  no At any time, has a partner hit, kicked or otherwise hurt or frightened you?: no Over the past 2 weeks, have you felt down, depressed or hopeless?: no Over the past 2 weeks, have you felt little interest or pleasure in doing things?:no   Gynecologic History Patient's last menstrual period was 07/19/2017 (exact date). Contraception: OCP (estrogen/progesterone) Last Pap: 2018. Results were: normal Last mammogram: n/a. Results were: n/a  Obstetric History OB History  Gravida Para Term Preterm AB Living  2 1 1   1 1   SAB TAB Ectopic Multiple Live Births    1     1    # Outcome Date GA Lbr Len/2nd Weight Sex Delivery Anes PTL Lv  2 Term 07/22/01 [redacted]w[redacted]d  7 lb (3.175 kg) F CS-LTranv EPI N LIV  1 TAB             Past Medical History:  Diagnosis Date  . Chronic insomnia 11/08/2016  . Dyspareunia   . Essential hypertension, benign   . Genital herpes, unspecified   . Headache syndrome 11/08/2016  . Irregular menstrual cycle   . Surveillance of previously prescribed contraceptive pill     Past Surgical History:  Procedure Laterality Date  . BREAST REDUCTION SURGERY    . CESAREAN SECTION       Current Outpatient Medications:  .  hydrochlorothiazide (HYDRODIURIL) 25 MG tablet, TAKE 1 TABLET BY MOUTH  EVERY DAY, Disp: 30 tablet, Rfl: 9 .  ibuprofen (ADVIL,MOTRIN) 800 MG tablet, Take 1 tablet (800 mg total) by mouth every 8 (eight) hours as needed., Disp: 30 tablet, Rfl: 5 .  SUMAtriptan (IMITREX) 100 MG tablet, Take 1 tablet (100 mg total) by mouth 2 (two) times daily as needed for migraine., Disp: 10 tablet, Rfl: 3 .  VALTREX 500 MG tablet, TAKE 1 TABLET (500 MG TOTAL) BY MOUTH DAILY., Disp: 30 tablet, Rfl: prn .  fluconazole (DIFLUCAN) 150 MG tablet, Take 1 tablet (150 mg total) by mouth once for 1 dose., Disp: 1 tablet, Rfl: 2 .  metroNIDAZOLE (METROGEL VAGINAL) 0.75 % vaginal gel, Place 1 Applicatorful vaginally 2 (two) times daily., Disp: 70 g, Rfl: 2 .  Norgestimate-Ethinyl Estradiol Triphasic (ORTHO TRI-CYCLEN LO) 0.18/0.215/0.25 MG-25 MCG tab, Take 1 tablet by mouth daily., Disp: 1 Package, Rfl: 11 Allergies  Allergen Reactions  . Sulfa Antibiotics Shortness Of Breath    Social History   Tobacco Use  . Smoking status: Never Smoker  . Smokeless tobacco: Never Used  Substance Use Topics  . Alcohol use: Yes    Alcohol/week: 0.0 oz    Comment: rare- once every 3 months    Family History  Problem Relation Age of Onset  . Diabetes Mother   . Hyperlipidemia Mother   . Arthritis Mother   . Diabetes Sister   . Arthritis  Paternal Grandmother   . Congenital heart disease Unknown       Review of Systems  Constitutional: negative for fatigue and weight loss Respiratory: negative for cough and wheezing Cardiovascular: negative for chest pain, fatigue and palpitations Gastrointestinal: negative for abdominal pain and change in bowel habits Musculoskeletal:negative for myalgias Neurological: negative for gait problems and tremors Behavioral/Psych: negative for abusive relationship, depression Endocrine: negative for temperature intolerance    Genitourinary:negative for abnormal menstrual periods, genital lesions, hot flashes, sexual problems and vaginal  discharge Integument/breast: negative for breast lump, breast tenderness, nipple discharge and skin lesion(s)    Objective:       BP 128/84   Pulse 84   Ht 5\' 3"  (1.6 m)   Wt 187 lb (84.8 kg)   LMP 07/19/2017 (Exact Date)   BMI 33.13 kg/m  General:   alert  Skin:   no rash or abnormalities  Lungs:   clear to auscultation bilaterally  Heart:   regular rate and rhythm, S1, S2 normal, no murmur, click, rub or gallop  Breasts:   normal without suspicious masses, skin or nipple changes or axillary nodes  Abdomen:  normal findings: no organomegaly, soft, non-tender and no hernia  Pelvis:  External genitalia: normal general appearance Urinary system: urethral meatus normal and bladder without fullness, nontender Vaginal: normal without tenderness, induration or masses Cervix: normal appearance Adnexa: normal bimanual exam Uterus: anteverted and non-tender, normal size   Lab Review Urine pregnancy test Labs reviewed yes Radiologic studies reviewed no  50% of 20 min visit spent on counseling and coordination of care.   Assessment:     1. Encounter for annual routine gynecological examination Rx: - Hemoglobin A1c  2. Screening for cervical cancer Rx: - Cytology - PAP  3. Vaginal discharge Rx: - Cervicovaginal ancillary only - metroNIDAZOLE (METROGEL VAGINAL) 0.75 % vaginal gel; Place 1 Applicatorful vaginally 2 (two) times daily.  Dispense: 70 g; Refill: 2 - fluconazole (DIFLUCAN) 150 MG tablet; Take 1 tablet (150 mg total) by mouth once for 1 dose.  Dispense: 1 tablet; Refill: 2  4. Screen for STD (sexually transmitted disease) Rx: - Hepatitis B surface antigen - Hepatitis C antibody - HIV antibody - RPR  5. Screening breast examination Rx: - MM Digital Screening; Future  6. Hematuria, unspecified type Rx: - POCT Urinalysis Dipstick - Urine Culture  7. Encounter for surveillance of contraceptive pills Rx: - Norgestimate-Ethinyl Estradiol Triphasic (ORTHO  TRI-CYCLEN LO) 0.18/0.215/0.25 MG-25 MCG tab; Take 1 tablet by mouth daily.  Dispense: 1 Package; Refill: 11    Plan:    Education reviewed: calcium supplements, depression evaluation, low fat, low cholesterol diet, safe sex/STD prevention, self breast exams, skin cancer screening and weight bearing exercise. Contraception: OCP (estrogen/progesterone). Mammogram ordered. Follow up in: 1 year.   Meds ordered this encounter  Medications  . metroNIDAZOLE (METROGEL VAGINAL) 0.75 % vaginal gel    Sig: Place 1 Applicatorful vaginally 2 (two) times daily.    Dispense:  70 g    Refill:  2  . fluconazole (DIFLUCAN) 150 MG tablet    Sig: Take 1 tablet (150 mg total) by mouth once for 1 dose.    Dispense:  1 tablet    Refill:  2  . Norgestimate-Ethinyl Estradiol Triphasic (ORTHO TRI-CYCLEN LO) 0.18/0.215/0.25 MG-25 MCG tab    Sig: Take 1 tablet by mouth daily.    Dispense:  1 Package    Refill:  11   Orders Placed This Encounter  Procedures  .  Urine Culture  . MM Digital Screening    Standing Status:   Future    Standing Expiration Date:   09/27/2018    Order Specific Question:   Reason for Exam (SYMPTOM  OR DIAGNOSIS REQUIRED)    Answer:   Screening    Order Specific Question:   Is the patient pregnant?    Answer:   No    Order Specific Question:   Preferred imaging location?    Answer:   [facility]  . Hepatitis B surface antigen  . Hepatitis C antibody  . HIV antibody  . RPR  . Hemoglobin A1c  . POCT Urinalysis Dipstick     [name] MD 07-27-2017

## 2017-07-27 NOTE — Progress Notes (Signed)
Patient presents for her Annual Exam today.  Last pap:07/12/2016 WNL   LMP: 07/19/17  Contraception: BCP's   STD Screening: Full Panel   Mammogram: Has not had mammogram yet.   CC: None

## 2017-07-28 LAB — RPR: RPR: NONREACTIVE

## 2017-07-28 LAB — HIV ANTIBODY (ROUTINE TESTING W REFLEX): HIV SCREEN 4TH GENERATION: NONREACTIVE

## 2017-07-28 LAB — HEPATITIS B SURFACE ANTIGEN: HEP B S AG: NEGATIVE

## 2017-07-28 LAB — HEMOGLOBIN A1C
Est. average glucose Bld gHb Est-mCnc: 114 mg/dL
Hgb A1c MFr Bld: 5.6 % (ref 4.8–5.6)

## 2017-07-28 LAB — HEPATITIS C ANTIBODY

## 2017-07-29 LAB — URINE CULTURE: Organism ID, Bacteria: NO GROWTH

## 2017-07-30 ENCOUNTER — Telehealth: Payer: Self-pay | Admitting: *Deleted

## 2017-07-30 LAB — CYTOLOGY - PAP
ADEQUACY: ABSENT
Diagnosis: NEGATIVE
HPV (WINDOPATH): NOT DETECTED

## 2017-07-30 LAB — CERVICOVAGINAL ANCILLARY ONLY
Bacterial vaginitis: NEGATIVE
CHLAMYDIA, DNA PROBE: NEGATIVE
Candida vaginitis: NEGATIVE
Neisseria Gonorrhea: NEGATIVE
Trichomonas: NEGATIVE

## 2017-07-30 NOTE — Telephone Encounter (Signed)
Pt called to office and states she was recently seen by Dr Jodi Mourning. Pt states she was to get Rx's for Valtrex and Ibuprofen.  Pt states Rx's not at pharmacy.  Please send Rx's.

## 2017-07-31 ENCOUNTER — Other Ambulatory Visit: Payer: Self-pay | Admitting: Obstetrics

## 2017-07-31 DIAGNOSIS — A6004 Herpesviral vulvovaginitis: Secondary | ICD-10-CM

## 2017-07-31 DIAGNOSIS — N946 Dysmenorrhea, unspecified: Secondary | ICD-10-CM

## 2017-07-31 MED ORDER — VALTREX 500 MG PO TABS: ORAL_TABLET | ORAL | 11 refills | Status: DC

## 2017-07-31 MED ORDER — IBUPROFEN 800 MG PO TABS
800.0000 mg | ORAL_TABLET | Freq: Three times a day (TID) | ORAL | 5 refills | Status: DC | PRN
Start: 2017-07-31 — End: 2018-07-22

## 2017-07-31 NOTE — Telephone Encounter (Signed)
Valtrex and Ibuprofen Rx

## 2017-08-02 ENCOUNTER — Telehealth: Payer: Self-pay

## 2017-08-02 NOTE — Telephone Encounter (Signed)
Pt called wanting to know her lab results from her last visit. I advised pt that Pap was normal, vaginal swab normal, and all blood STD testing was normal, Hgb A1C WNL. Pt verbalized understanding.

## 2017-08-16 ENCOUNTER — Ambulatory Visit: Payer: 59 | Admitting: Internal Medicine

## 2017-08-31 ENCOUNTER — Ambulatory Visit
Admission: RE | Admit: 2017-08-31 | Discharge: 2017-08-31 | Disposition: A | Discharge status: Home / Self Care | Payer: 59 | Source: Ambulatory Visit | Attending: Obstetrics | Admitting: Obstetrics

## 2017-08-31 DIAGNOSIS — Z1239 Encounter for other screening for malignant neoplasm of breast: Secondary | ICD-10-CM

## 2017-09-14 ENCOUNTER — Ambulatory Visit: Payer: 59 | Admitting: Internal Medicine

## 2017-10-01 ENCOUNTER — Ambulatory Visit (HOSPITAL_COMMUNITY)
Admission: EM | Admit: 2017-10-01 | Discharge: 2017-10-01 | Disposition: A | Discharge status: Home / Self Care | Payer: 59 | Attending: Family Medicine | Admitting: Family Medicine

## 2017-10-01 ENCOUNTER — Encounter (HOSPITAL_COMMUNITY): Payer: Self-pay

## 2017-10-01 DIAGNOSIS — R609 Edema, unspecified: Secondary | ICD-10-CM

## 2017-10-01 MED ORDER — FUROSEMIDE 20 MG PO TABS: 20.0000 mg | ORAL_TABLET | Freq: Every day | ORAL | 0 refills | Status: DC

## 2017-10-01 NOTE — ED Triage Notes (Signed)
Pt presents with swelling in both legs and feet and a tingling sensation in both legs

## 2017-10-01 NOTE — Discharge Instructions (Signed)
You may continue normal activity Take furosemide instead of hydrochlorothiazide once a day for the next 3 days Elevate your feet when you are able No added salt in your diet You should see an improvement in the swelling in your ankles.  After this, go back on your daily hydrochlorothiazide if you continue to have swelling as a recurring problem, you need to see a PCP While you are on Lasix make sure that you eat enough potassium foods (banana, orange juice, oranges)

## 2017-10-01 NOTE — ED Provider Notes (Signed)
[place]    CSN: [id number] Arrival date & time: 10/01/17  1918     History   Chief Complaint Chief Complaint  Patient presents with  . Leg Swelling    both legs and feet    HPI Amanda Cox is a 42 y.o. female.   HPI  Amanda Cox is a pleasant 43 year old woman with hypertension.  She is compliant with taking hydrochlorothiazide 25 mg a day.  She is prescribed this by her OB/GYN doctor.  She does go for yearly physical examination.  She does not have a primary care doctor.  She traveled out of town for the weekend, was had a long car ride.  They ate out all weekend, so she know she had more salty food.  Since yesterday evening she noticed swelling in both ankles.  It is painless.  No swelling in hands, abdomen, or lungs suspected.  No history of heart disease, no chest pain, no shortness of breath, no palpitations.  She is had this to some degree in the past but this is the worst it has  ever been.  No known kidney or liver disease.  Past Medical History:  Diagnosis Date  . Chronic insomnia 11/08/2016  . Dyspareunia   . Essential hypertension, benign   . Genital herpes, unspecified   . Headache syndrome 11/08/2016  . Irregular menstrual cycle   . Surveillance of previously prescribed contraceptive pill     Patient Active Problem List   Diagnosis Date Noted  . Headache syndrome 11/08/2016  . Chronic insomnia 11/08/2016  . Snoring 08/19/2013  . Obesity, unspecified 08/19/2013  . Screening examination for venereal disease 07/10/2013  . Essential hypertension, benign 07/10/2013  . Urinary tract infection, site not specified 07/10/2013  . Candidiasis of vulva and vagina 07/10/2013  . Genital herpes 07/17/2012  . BV (bacterial vaginosis) 07/17/2012    Past Surgical History:  Procedure Laterality Date  . BREAST REDUCTION SURGERY    . CESAREAN SECTION      OB History    Gravida  2   Para  1   Term  1   Preterm      AB  1   Living  1     SAB       TAB  1   Ectopic      Multiple      Live Births  1            Home Medications    Prior to Admission medications   Medication Sig Start Date End Date Taking? Authorizing Provider  furosemide (LASIX) 20 MG tablet Take 1 tablet (20 mg total) by mouth daily. 10/01/17   Raylene Everts, MD  hydrochlorothiazide (HYDRODIURIL) 25 MG tablet TAKE 1 TABLET BY MOUTH EVERY DAY 07/20/17   [name], MD  ibuprofen (ADVIL,MOTRIN) 800 MG tablet Take 1 tablet (800 mg total) by mouth every 8 (eight) hours as needed. 07/31/17   [name], MD  Norgestimate-Ethinyl Estradiol Triphasic (ORTHO TRI-CYCLEN LO) 0.18/0.215/0.25 MG-25 MCG tab Take 1 tablet by mouth daily. 07/27/17   [name], MD  VALTREX 500 MG tablet TAKE 1 TABLET (500 MG TOTAL) BY MOUTH DAILY. 07/31/17   [name], MD    Family History Family History  Problem Relation Age of Onset  . Diabetes Mother   . Hyperlipidemia Mother   . Arthritis Mother   . Diabetes Sister   . Arthritis Paternal Grandmother   . Congenital heart disease  Unknown     Social History Social History   Tobacco Use  . Smoking status: Never Smoker  . Smokeless tobacco: Never Used  Substance Use Topics  . Alcohol use: Yes    Alcohol/week: 0.0 standard drinks    Comment: rare- once every 3 months  . Drug use: No     Allergies   Sulfa antibiotics   Review of Systems Review of Systems  Constitutional: Negative for chills and fever.  HENT: Negative for ear pain and sore throat.   Eyes: Negative for pain and visual disturbance.  Respiratory: Negative for cough and shortness of breath.   Cardiovascular: Positive for leg swelling. Negative for chest pain and palpitations.  Gastrointestinal: Negative for abdominal pain and vomiting.  Genitourinary: Negative for dysuria and hematuria.  Musculoskeletal: Negative for arthralgias and back pain.  Skin: Negative for color change and rash.  Neurological: Negative for  seizures and syncope.  All other systems reviewed and are negative.    Physical Exam Triage Vital Signs ED Triage Vitals  Enc Vitals Group     BP 10/01/17 2002 136/85     Pulse Rate 10/01/17 2002 71     Resp 10/01/17 2002 19     Temp 10/01/17 2002 98.9 F (37.2 C)     Temp Source 10/01/17 2002 Temporal     SpO2 10/01/17 2002 100 %     Weight --      Height --      Head Circumference --      Peak Flow --      Pain Score 10/01/17 2000 1     Pain Loc --      Pain Edu? --      Excl. in [place]? --    No data found.  Updated Vital Signs BP 136/85 (BP Location: Left Arm)   Pulse 71   Temp 98.9 F (37.2 C) (Temporal)   Resp 19   LMP 09/05/2017   SpO2 100%       Physical Exam  Constitutional: She appears well-developed and well-nourished. No distress.  HENT:  Head: Normocephalic and atraumatic.  Right Ear: External ear normal.  Left Ear: External ear normal.  Mouth/Throat: Oropharynx is clear and moist.  Eyes: Pupils are equal, round, and reactive to light. Conjunctivae are normal.  Neck: Normal range of motion. No thyromegaly present.  Cardiovascular: Normal rate, regular rhythm, normal heart sounds and intact distal pulses.  No murmur heard. Pulmonary/Chest: Effort normal and breath sounds normal. No respiratory distress. She has no rales.  Abdominal: Soft. She exhibits no distension.  Musculoskeletal: Normal range of motion. She exhibits no edema.  Lymphadenopathy:    She has no cervical adenopathy.  Neurological: She is alert.  Skin: Skin is warm and dry.  Psychiatric: She has a normal mood and affect. Her behavior is normal.     UC Treatments / Results  Labs (all labs ordered are listed, but only abnormal results are displayed) Labs Reviewed - No data to display  EKG None  Radiology No results found.  Procedures Procedures (including critical care time)  Medications Ordered in UC Medications - No data to display  Initial Impression / Assessment and  Plan / UC Course  I have reviewed the triage vital signs and the nursing notes.  Pertinent labs & imaging results that were available during my care of the patient were reviewed by me and considered in my medical decision making (see chart for details).     Discussed pedal edema.  Heart failure.  Multitude of etiologies.  Recommend 3 days of Lasix, elevation, no salt diet.  If this does not resolve will need additional evaluation.  Recommend PCP. Final Clinical Impressions(s) / UC Diagnoses   Final diagnoses:  Peripheral edema     Discharge Instructions     You may continue normal activity Take furosemide instead of hydrochlorothiazide once a day for the next 3 days Elevate your feet when you are able No added salt in your diet You should see an improvement in the swelling in your ankles.  After this, go back on your daily hydrochlorothiazide if you continue to have swelling as a recurring problem, you need to see a PCP While you are on Lasix make sure that you eat enough potassium foods (banana, orange juice, oranges)   ED Prescriptions    Medication Sig Dispense Auth. Provider   furosemide (LASIX) 20 MG tablet Take 1 tablet (20 mg total) by mouth daily. 30 tablet Raylene Everts, MD     Controlled Substance Prescriptions [place] Controlled Substance Registry consulted? Not Applicable   Raylene Everts, MD 10/01/17 2039

## 2017-10-30 ENCOUNTER — Encounter: Payer: Self-pay | Admitting: Internal Medicine

## 2017-10-31 IMAGING — CR DG CHEST 2V
2 series · 2 of 2 positions shown · non-contrast
Comparison: CT 09/24/2004.  Chest x-ray 09/24/2004.

CLINICAL DATA: Cough.

EXAM:
CHEST  2 VIEW

[chest pa]
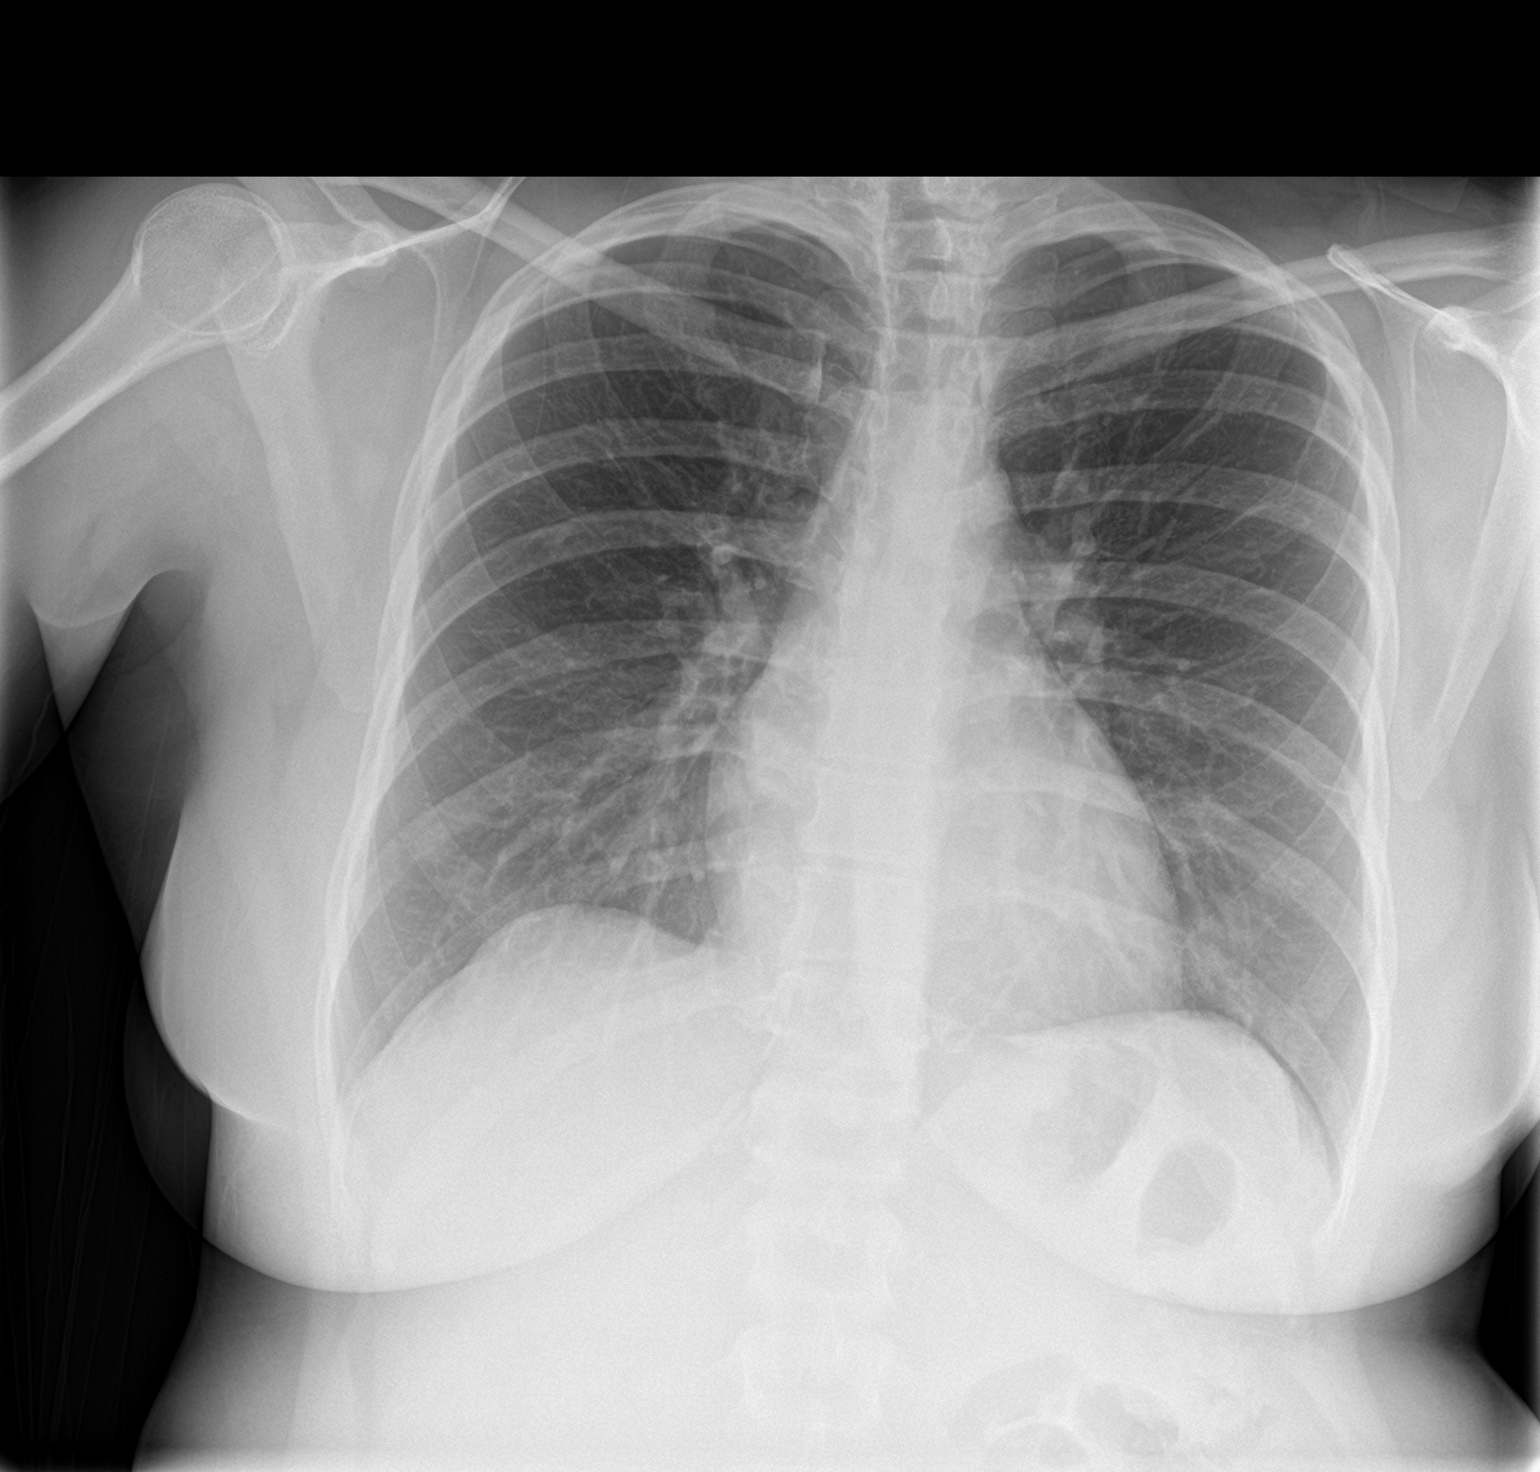

[chest lat]
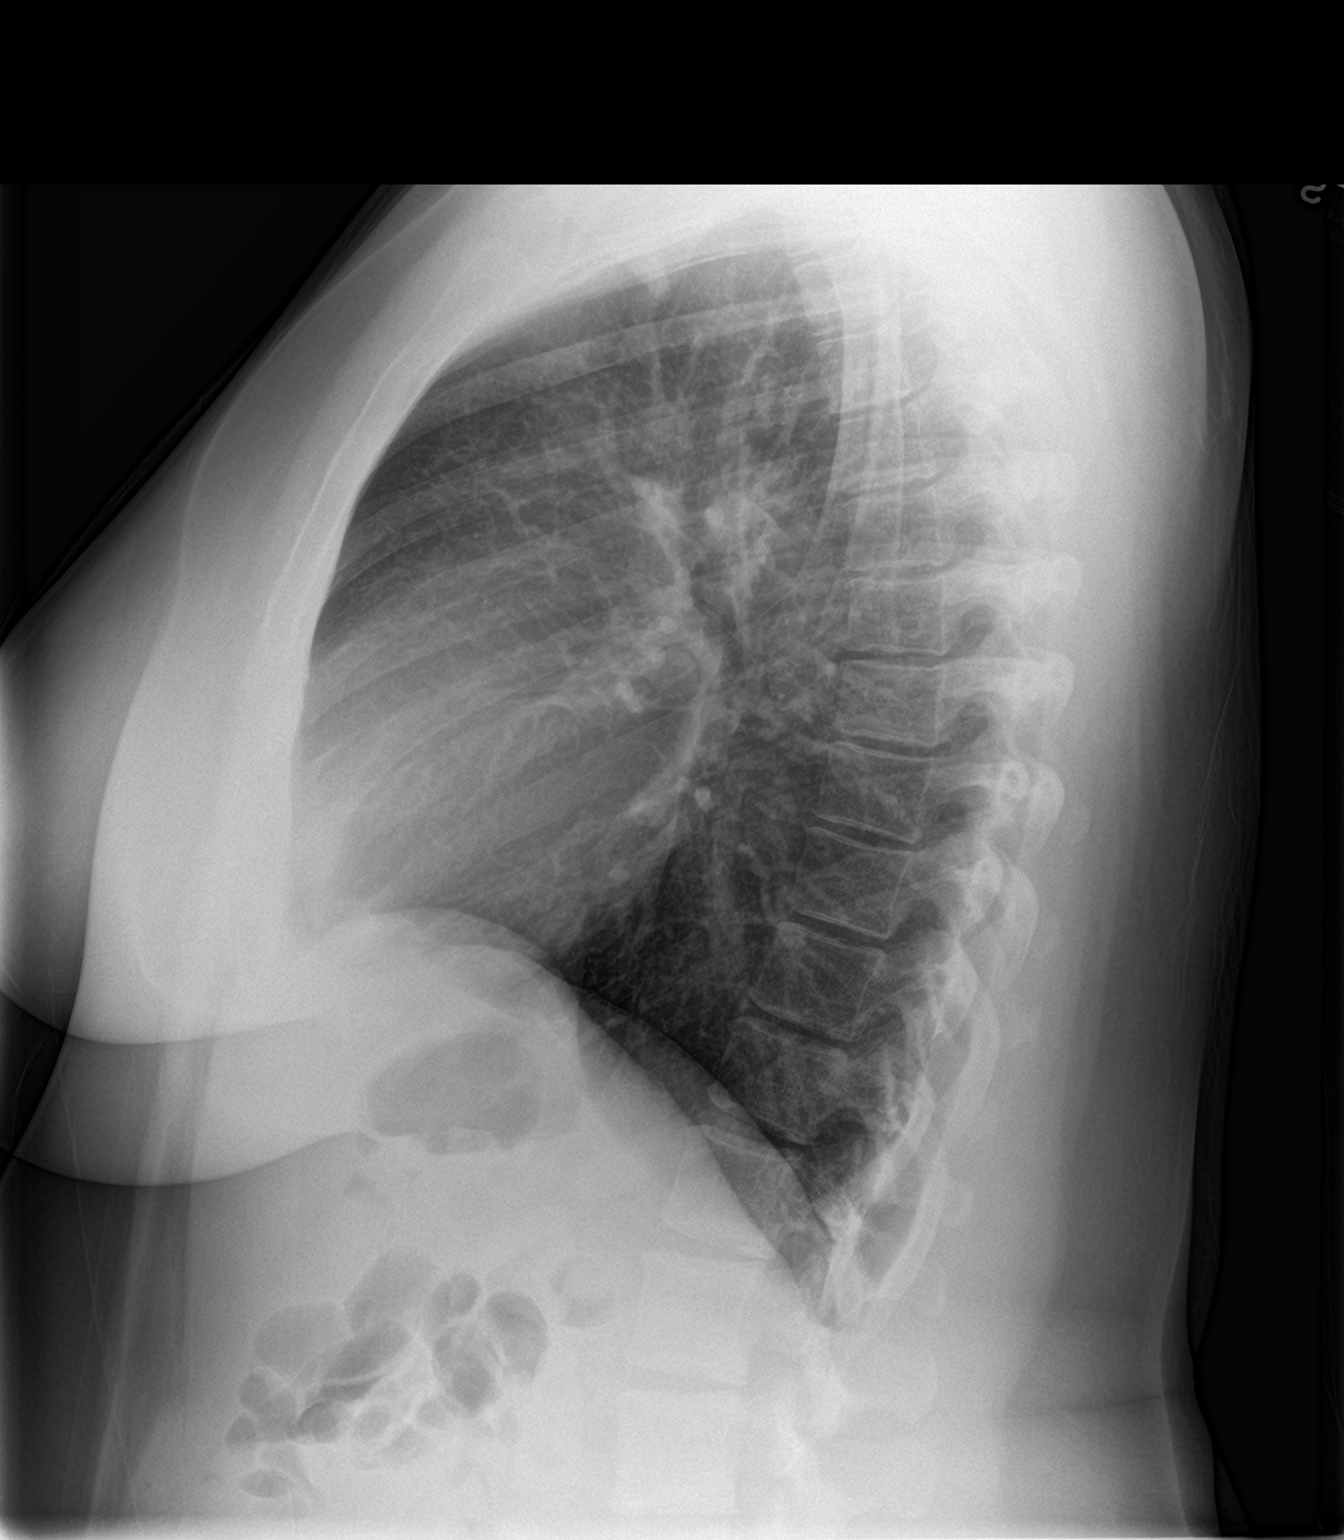

[2 of 2 positions shown; findings below may reference images not displayed]

FINDINGS: Mediastinum hilar structures normal. Lungs are clear. No pleural
effusion or pneumothorax. No acute bony abnormality.
IMPRESSION: No active cardiopulmonary disease.

## 2017-11-01 ENCOUNTER — Telehealth: Payer: Self-pay | Admitting: Internal Medicine

## 2017-11-01 NOTE — Telephone Encounter (Signed)
Copied from [place] [phone]. Topic: Appointment Scheduling - Prior Auth Required for Appointment >> Nov 01, 2017  2:02 PM Vernona Rieger wrote: Patient states that Dr Sharlet Salina told her sister today at her appointment [facility] )that she would accept her as a new patient. If approved, please call patient to schedule.

## 2017-11-05 NOTE — Telephone Encounter (Signed)
fine

## 2017-11-05 NOTE — Telephone Encounter (Signed)
Pt will cal back to schedule

## 2017-11-08 ENCOUNTER — Other Ambulatory Visit: Payer: Self-pay

## 2017-11-08 ENCOUNTER — Encounter: Payer: Self-pay | Admitting: Internal Medicine

## 2017-11-08 ENCOUNTER — Ambulatory Visit (HOSPITAL_COMMUNITY)
Admission: EM | Admit: 2017-11-08 | Discharge: 2017-11-08 | Disposition: A | Discharge status: Home / Self Care | Payer: 59 | Attending: Family Medicine | Admitting: Family Medicine

## 2017-11-08 ENCOUNTER — Encounter (INDEPENDENT_AMBULATORY_CARE_PROVIDER_SITE_OTHER): Payer: Self-pay

## 2017-11-08 ENCOUNTER — Ambulatory Visit (INDEPENDENT_AMBULATORY_CARE_PROVIDER_SITE_OTHER): Payer: 59 | Admitting: Internal Medicine

## 2017-11-08 ENCOUNTER — Encounter (HOSPITAL_COMMUNITY): Payer: Self-pay

## 2017-11-08 VITALS — BP 134/84 | HR 96 | Ht 63.0 in | Wt 199.2 lb

## 2017-11-08 DIAGNOSIS — T24209A Burn of second degree of unspecified site of unspecified lower limb, except ankle and foot, initial encounter: Secondary | ICD-10-CM

## 2017-11-08 DIAGNOSIS — K5904 Chronic idiopathic constipation: Secondary | ICD-10-CM | POA: Diagnosis not present

## 2017-11-08 DIAGNOSIS — T24202A Burn of second degree of unspecified site of left lower limb, except ankle and foot, initial encounter: Secondary | ICD-10-CM | POA: Diagnosis not present

## 2017-11-08 MED ORDER — SILVER SULFADIAZINE 1 % EX CREA: 1.0000 "application " | TOPICAL_CREAM | Freq: Every day | CUTANEOUS | 0 refills | Status: DC

## 2017-11-08 MED ORDER — SILVER SULFADIAZINE 1 % EX CREA
TOPICAL_CREAM | Freq: Once | CUTANEOUS | Status: AC
  Administered 2017-11-08: 18:00:00 via TOPICAL

## 2017-11-08 MED ORDER — LINACLOTIDE 72 MCG PO CAPS: 72.0000 ug | ORAL_CAPSULE | Freq: Every day | ORAL | 0 refills | Status: DC

## 2017-11-08 NOTE — ED Triage Notes (Signed)
Pt has a burn to her left thigh.

## 2017-11-08 NOTE — Progress Notes (Signed)
Patient ID: Amanda Cox, female   DOB: September 07, 1975, 42 y.o.   MRN: [id number] HPI: Amanda Cox is a 42 year old female with a history of chronic constipation, hypertension who is seen in consultation at the request of Dr. Sharlet Salina to evaluate chronic constipation.  She is here alone today.  She reports long-standing complication dating back to when she was a teenager.  She is tried multiple over-the-counter laxatives often without benefit.  MiraLAX daily nor Dulcolax has been effective for her.  She has tried colonic hydrotherapy which she reports doing approximately 1-2 times per year.  Bowel movements can be as infrequently as once per week but on average every 3 to 4 days.  Her constipation results and hard incomplete bowel movements.  No blood in her stool or melena.  She can have crampy lower abdominal discomfort and abdominal bloating.  No abdominal pain on a regular basis.  She denies nausea and vomiting.  No trouble with heartburn, dysphagia or odynophagia.  She has had issues with heartburn in the past but none recently.  She was seen for severe constipation by urgent care in April 2019.  They recommended a MiraLAX purge which was 255 g of MiraLAX in 64 ounces of water.  This was very effective for her and for several weeks afterwards bowel movements were more normal but they returned to her constipated state.  No family history of colorectal cancer.  She is single.  She works as a Radiation protection practitioner.  No tobacco use.  Alcohol maybe 1 or 2 times per month.  Past Medical History:  Diagnosis Date  . Chronic insomnia 11/08/2016  . Dyspareunia   . Essential hypertension, benign   . Genital herpes, unspecified   . Headache syndrome 11/08/2016  . Irregular menstrual cycle   . Surveillance of previously prescribed contraceptive pill     Past Surgical History:  Procedure Laterality Date  . BREAST REDUCTION SURGERY    . CESAREAN SECTION      Outpatient Medications Prior to  Visit  Medication Sig Dispense Refill  . fluconazole (DIFLUCAN) 200 MG tablet 1 tablet po every other day prn.    . fluticasone (FLONASE) 50 MCG/ACT nasal spray as needed.     . metroNIDAZOLE (METROGEL) 0.75 % vaginal gel Place vaginally as needed.     . Norgestimate-Ethinyl Estradiol Triphasic 0.18/0.215/0.25 MG-25 MCG tab Take by mouth.    . furosemide (LASIX) 20 MG tablet Take 1 tablet (20 mg total) by mouth daily. 30 tablet 0  . hydrochlorothiazide (HYDRODIURIL) 25 MG tablet TAKE 1 TABLET BY MOUTH EVERY DAY (Patient not taking: Reported on 11/08/2017) 30 tablet 9  . ibuprofen (ADVIL,MOTRIN) 800 MG tablet Take 1 tablet (800 mg total) by mouth every 8 (eight) hours as needed. 30 tablet 5  . VALTREX 500 MG tablet TAKE 1 TABLET (500 MG TOTAL) BY MOUTH DAILY. (Patient taking differently: 500 mg as needed. TAKE 1 TABLET (500 MG TOTAL) BY MOUTH DAILY.) 30 tablet 11  . Norgestimate-Ethinyl Estradiol Triphasic (ORTHO TRI-CYCLEN LO) 0.18/0.215/0.25 MG-25 MCG tab Take 1 tablet by mouth daily. 1 Package 11   No facility-administered medications prior to visit.     Allergies  Allergen Reactions  . Sulfa Antibiotics Shortness Of Breath    Family History  Problem Relation Age of Onset  . Diabetes Mother   . Hyperlipidemia Mother   . Arthritis Mother   . Diabetes Father   . Diabetes Sister   . Arthritis Paternal Grandmother   . Hypertension Paternal  Grandmother   . Congenital heart disease Unknown   . Colon cancer Neg Hx   . Rectal cancer Neg Hx   . Esophageal cancer Neg Hx   . Stomach cancer Neg Hx   . Liver cancer Neg Hx     Social History   Tobacco Use  . Smoking status: Never Smoker  . Smokeless tobacco: Never Used  Substance Use Topics  . Alcohol use: Yes    Alcohol/week: 0.0 standard drinks    Comment: rare- once every 3 months  . Drug use: No    ROS: As per history of present illness, otherwise negative  BP 134/84   Pulse 96   Ht 5\' 3"  (1.6 m)   Wt 199 lb 4 oz (90.4  kg)   LMP 10/25/2017   BMI 35.30 kg/m  Constitutional: Well-developed and well-nourished. No distress. HEENT: Normocephalic and atraumatic. Conjunctivae are normal.  No scleral icterus. Neck: Neck supple. Trachea midline. Cardiovascular: Normal rate, regular rhythm and intact distal pulses. No M/R/G Pulmonary/chest: Effort normal and breath sounds normal. No wheezing, rales or rhonchi. Abdominal: Soft, nontender, nondistended. Bowel sounds active throughout. There are no masses palpable. No hepatosplenomegaly. Extremities: no clubbing, cyanosis, or edema Neurological: Alert and oriented to person place and time. Skin: Skin is warm and dry.  Psychiatric: Normal mood and affect. Behavior is normal.   ASSESSMENT/PLAN: 42 year old female with a history of chronic constipation, hypertension who is seen in consultation at the request of Dr. Sharlet Salina to evaluate chronic constipation.   1.  Chronic idiopathic constipation --we discussed chronic constipation today.  I think she is appropriate for Linzess therapy.  We discussed Linzess today and I will start her at 72 mcg daily but we may need to dose titrate for response.  I asked that she try the dose for at least a week before determining if it is effective.  If ineffective or incompletely effective then would increase to 145 mcg daily.  When she settles on a dose we can provide a chronic prescription.  If no result with Linzess we can try another product such as Amitiza, Trulance or Motegrity.  2.  CRC screening --recommended for her at age 66  Cc: Dr. Pricilla Holm

## 2017-11-08 NOTE — Patient Instructions (Signed)
We have sent the following medications to your pharmacy for you to pick up at your convenience: Linzess 72 mcg daily. Take this x 1 week. If this is ineffective, double the dose to 2 tablets daily (total of 144 mcg).   Call our office at [phone] and let us know which dosage works better for you. We can send a prescription for that dosage.  You will be due for a recall colonoscopy in 06/2022. We will send you a reminder in the mail when it gets closer to that time.  If you are age 16 or older, your body mass index should be between 23-30. Your Body mass index is 35.3 kg/m. If this is out of the aforementioned range listed, please consider follow up with your Primary Care Provider.  If you are age 40 or younger, your body mass index should be between 19-25. Your Body mass index is 35.3 kg/m. If this is out of the aformentioned range listed, please consider follow up with your Primary Care Provider.

## 2017-11-14 NOTE — ED Provider Notes (Signed)
[place]   [id number] 11/08/17 Arrival Time: 7829  ASSESSMENT & PLAN:  1. Superficial partial thickness burn of lower extremity     Meds ordered this encounter  Medications  . silver sulfADIAZINE (SILVADENE) 1 % cream    Sig: Apply 1 application topically daily.    Dispense:  50 g    Refill:  0  . silver sulfADIAZINE (SILVADENE) 1 % cream   Should continue to heal well. Will follow up with PCP or here if worsening or failing to improve as anticipated. Reviewed expectations re: course of current medical issues. Questions answered. Outlined signs and symptoms indicating need for more acute intervention. Patient verbalized understanding. After Visit Summary given.   SUBJECTIVE:  Amanda Cox is a 42 y.o. female who presents with a skin complaint.   Location: L upper thigh Onset: abrupt Duration: a few days ago Pruritic? No Painful? mild Progression: stable  Drainage? No  Reports burning this area. Occasional Tylenol. Has been gently cleaning. Overall improving. Denies fever. No specific aggravating or alleviating factors reported. Feels Td is UTD.  ROS: As per HPI.  OBJECTIVE: Vitals:   11/08/17 1648 11/08/17 1650  BP: (!) 146/94   Pulse: 74   Resp: 18   Temp: 99.1 F (37.3 C)   TempSrc: Oral   SpO2: 100%   Weight:  90.3 kg    General appearance: alert; no distress Lungs: clear to auscultation bilaterally Heart: regular rate and rhythm Extremities: no edema Skin: warm and dry; superficial partial thickness burn to inner L thigh; approx 2x3 inches; no sign of infection; no significant tenderness Psychological: alert and cooperative; normal mood and affect  Allergies  Allergen Reactions  . Sulfa Antibiotics Shortness Of Breath    Past Medical History:  Diagnosis Date  . Chronic insomnia 11/08/2016  . Dyspareunia   . Essential hypertension, benign   . Genital herpes, unspecified   . Headache syndrome 11/08/2016  . Irregular  menstrual cycle   . Surveillance of previously prescribed contraceptive pill    Social History   Socioeconomic History  . Marital status: Single    Spouse name: Not on file  . Number of children: 1  . Years of education: College  . Highest education level: Not on file  Occupational History    Employer: [employer]  . Financial resource strain: Not on file  . Food insecurity:    Worry: Not on file    Inability: Not on file  . Transportation needs:    Medical: Not on file    Non-medical: Not on file  Tobacco Use  . Smoking status: Never Smoker  . Smokeless tobacco: Never Used  Substance and Sexual Activity  . Alcohol use: Yes    Alcohol/week: 0.0 standard drinks    Comment: rare- once every 3 months  . Drug use: No  . Sexual activity: Yes    Partners: Male    Birth control/protection: OCP  Lifestyle  . Physical activity:    Days per week: Not on file    Minutes per session: Not on file  . Stress: Not on file  Relationships  . Social connections:    Talks on phone: Not on file    Gets together: Not on file    Attends religious service: Not on file    Active member of club or organization: Not on file    Attends meetings of clubs or organizations: Not on file    Relationship status: Not on file  .  Intimate partner violence:    Fear of current or ex partner: Not on file    Emotionally abused: Not on file    Physically abused: Not on file    Forced sexual activity: Not on file  Other Topics Concern  . Not on file  Social History Narrative   Patient is single and lives at home, her daughter lives with her.   Patient has a college education.   Patient is right-handed.   Caffeine use:  Soda once per month   Patient has one child.   Family History  Problem Relation Age of Onset  . Diabetes Mother   . Hyperlipidemia Mother   . Arthritis Mother   . Diabetes Father   . Diabetes Sister   . Arthritis Paternal Grandmother   . Hypertension  Paternal Grandmother   . Congenital heart disease Unknown   . Colon cancer Neg Hx   . Rectal cancer Neg Hx   . Esophageal cancer Neg Hx   . Stomach cancer Neg Hx   . Liver cancer Neg Hx    Past Surgical History:  Procedure Laterality Date  . BREAST REDUCTION SURGERY    . CESAREAN SECTION       Vanessa Kick, MD 11/14/17 [phone]

## 2017-11-18 ENCOUNTER — Ambulatory Visit (HOSPITAL_COMMUNITY)
Admission: EM | Admit: 2017-11-18 | Discharge: 2017-11-18 | Disposition: A | Discharge status: Home / Self Care | Payer: 59 | Attending: Urgent Care | Admitting: Urgent Care

## 2017-11-18 DIAGNOSIS — Z5189 Encounter for other specified aftercare: Secondary | ICD-10-CM | POA: Diagnosis not present

## 2017-11-18 DIAGNOSIS — L0889 Other specified local infections of the skin and subcutaneous tissue: Secondary | ICD-10-CM

## 2017-11-18 DIAGNOSIS — L089 Local infection of the skin and subcutaneous tissue, unspecified: Secondary | ICD-10-CM

## 2017-11-18 DIAGNOSIS — T24232D Burn of second degree of left lower leg, subsequent encounter: Secondary | ICD-10-CM

## 2017-11-18 DIAGNOSIS — T24202D Burn of second degree of unspecified site of left lower limb, except ankle and foot, subsequent encounter: Secondary | ICD-10-CM | POA: Diagnosis not present

## 2017-11-18 DIAGNOSIS — T148XXA Other injury of unspecified body region, initial encounter: Secondary | ICD-10-CM

## 2017-11-18 MED ORDER — SILVER SULFADIAZINE 1 % EX CREA: 1.0000 "application " | TOPICAL_CREAM | Freq: Every day | CUTANEOUS | 0 refills | Status: DC

## 2017-11-18 MED ORDER — CEPHALEXIN 500 MG PO CAPS: 500.0000 mg | ORAL_CAPSULE | Freq: Three times a day (TID) | ORAL | 0 refills | Status: DC

## 2017-11-18 NOTE — ED Provider Notes (Signed)
  MRN: [id number] DOB: 1975/11/17  Subjective:   Amanda Cox is a 42 y.o. female presenting for wound recheck. Has had purulent drainage of her wound over left thigh. Is almost out of her Silvadene. Denies fever, n/v, abdominal pain. Has tenderness around wound and is worried about infection. Patient works from home and does not cover her wound there.  reports that she has never smoked. She has never used smokeless tobacco. She reports that she drinks alcohol. She reports that she does not use drugs.    No current facility-administered medications for this encounter.   Current Outpatient Medications:  .  cephALEXin (KEFLEX) 500 MG capsule, Take 1 capsule (500 mg total) by mouth 3 (three) times daily., Disp: 21 capsule, Rfl: 0 .  fluconazole (DIFLUCAN) 200 MG tablet, 1 tablet po every other day prn., Disp: , Rfl:  .  fluticasone (FLONASE) 50 MCG/ACT nasal spray, as needed. , Disp: , Rfl:  .  furosemide (LASIX) 20 MG tablet, Take 1 tablet (20 mg total) by mouth daily., Disp: 30 tablet, Rfl: 0 .  hydrochlorothiazide (HYDRODIURIL) 25 MG tablet, TAKE 1 TABLET BY MOUTH EVERY DAY (Patient not taking: Reported on 11/08/2017), Disp: 30 tablet, Rfl: 9 .  ibuprofen (ADVIL,MOTRIN) 800 MG tablet, Take 1 tablet (800 mg total) by mouth every 8 (eight) hours as needed., Disp: 30 tablet, Rfl: 5 .  linaclotide (LINZESS) 72 MCG capsule, Take 1 capsule (72 mcg total) by mouth daily before breakfast., Disp: 90 capsule, Rfl: 0 .  metroNIDAZOLE (METROGEL) 0.75 % vaginal gel, Place vaginally as needed. , Disp: , Rfl:  .  Norgestimate-Ethinyl Estradiol Triphasic 0.18/0.215/0.25 MG-25 MCG tab, Take by mouth., Disp: , Rfl:  .  silver sulfADIAZINE (SILVADENE) 1 % cream, Apply 1 application topically daily., Disp: 50 g, Rfl: 0 .  VALTREX 500 MG tablet, TAKE 1 TABLET (500 MG TOTAL) BY MOUTH DAILY. (Patient taking differently: 500 mg as needed. TAKE 1 TABLET (500 MG TOTAL) BY MOUTH DAILY.), Disp: 30 tablet, Rfl: 11    Allergies  Allergen Reactions  . Sulfa Antibiotics Shortness Of Breath    Past Medical History:  Diagnosis Date  . Chronic insomnia 11/08/2016  . Dyspareunia   . Essential hypertension, benign   . Genital herpes, unspecified   . Headache syndrome 11/08/2016  . Irregular menstrual cycle   . Surveillance of previously prescribed contraceptive pill      Past Surgical History:  Procedure Laterality Date  . BREAST REDUCTION SURGERY    . CESAREAN SECTION      Objective:   Vitals: BP 128/81 (BP Location: Right Arm)   Pulse 87   Temp 98.2 F (36.8 C) (Oral)   Resp 18   LMP 10/25/2017   SpO2 100%   Physical Exam  Constitutional: She is oriented to person, place, and time. She appears well-developed and well-nourished.  Cardiovascular: Normal rate.  Pulmonary/Chest: Effort normal.  Neurological: She is alert and oriented to person, place, and time.  Skin:        Assessment and Plan :   Visit for wound check  Wound infection  Partial thickness burn of left lower leg, subsequent encounter  Wound care reviewed. Start antibiotic course to address infection. Refilled Silvadene. Counseled patient on potential for adverse effects with medications prescribed today, patient verbalized understanding. Return-to-clinic precautions discussed, patient verbalized understanding.    Jaynee Eagles, PA-C 11/26/17 1134

## 2017-11-18 NOTE — ED Triage Notes (Signed)
Pt here need a wound check left upper thigh. ( burn )

## 2017-11-18 NOTE — Discharge Instructions (Addendum)
Keep your wound covered with non-adherent gauze pads. You can use Tega-derm to secure the gauze pad. Change your dressing daily before showering, leave it open to the air after you pat it dry for 1-2 hours. Then cover it again until the next day.

## 2017-11-28 ENCOUNTER — Telehealth: Payer: Self-pay | Admitting: Internal Medicine

## 2017-11-28 NOTE — Telephone Encounter (Signed)
Pt called to inform that linzess did not work for her, it gave her gasses, abd pain and bloating. Would like a different medication

## 2017-11-28 NOTE — Telephone Encounter (Signed)
See note below and advise. Pt was taking linzess 32mcg.

## 2017-11-28 NOTE — Telephone Encounter (Signed)
Would try Amitiza 8 mcg twice daily with food

## 2017-11-29 MED ORDER — LUBIPROSTONE 8 MCG PO CAPS: 8.0000 ug | ORAL_CAPSULE | Freq: Two times a day (BID) | ORAL | 1 refills | Status: DC

## 2017-11-29 NOTE — Telephone Encounter (Signed)
Spoke with pt and she is aware, script sent to pharmacy. 

## 2017-12-04 ENCOUNTER — Telehealth: Payer: Self-pay | Admitting: Internal Medicine

## 2017-12-04 ENCOUNTER — Encounter (HOSPITAL_BASED_OUTPATIENT_CLINIC_OR_DEPARTMENT_OTHER): Payer: 59 | Attending: Internal Medicine

## 2017-12-04 DIAGNOSIS — I10 Essential (primary) hypertension: Secondary | ICD-10-CM | POA: Insufficient documentation

## 2017-12-04 DIAGNOSIS — L97121 Non-pressure chronic ulcer of left thigh limited to breakdown of skin: Secondary | ICD-10-CM | POA: Diagnosis present

## 2017-12-04 NOTE — Telephone Encounter (Signed)
Patient states medication Amitiza is too expensive and wants to know if there is something else or samples.

## 2017-12-05 NOTE — Telephone Encounter (Signed)
Sent to me by mistake.  Not sure if an Amitiza copay card would help

## 2017-12-06 NOTE — Telephone Encounter (Signed)
Attempted to call pt back but receive message that voicemail box has not been set up yet. Have copay card for amitiza that should help pt.  Spoke with pt and she would like the card mailed to her. Card placed in the mail today.

## 2017-12-13 DIAGNOSIS — L97121 Non-pressure chronic ulcer of left thigh limited to breakdown of skin: Secondary | ICD-10-CM | POA: Diagnosis not present

## 2017-12-14 ENCOUNTER — Ambulatory Visit: Payer: 59 | Admitting: Internal Medicine

## 2017-12-26 NOTE — Telephone Encounter (Signed)
Patient's Amitiza has been approved until 06/24/2018 through [employer]. [id number].

## 2017-12-27 ENCOUNTER — Encounter (HOSPITAL_BASED_OUTPATIENT_CLINIC_OR_DEPARTMENT_OTHER): Payer: Self-pay

## 2017-12-27 ENCOUNTER — Encounter (HOSPITAL_BASED_OUTPATIENT_CLINIC_OR_DEPARTMENT_OTHER): Payer: 59 | Attending: Internal Medicine

## 2017-12-27 DIAGNOSIS — I10 Essential (primary) hypertension: Secondary | ICD-10-CM | POA: Diagnosis not present

## 2017-12-27 DIAGNOSIS — Z09 Encounter for follow-up examination after completed treatment for conditions other than malignant neoplasm: Secondary | ICD-10-CM | POA: Insufficient documentation

## 2017-12-27 DIAGNOSIS — Z872 Personal history of diseases of the skin and subcutaneous tissue: Secondary | ICD-10-CM | POA: Diagnosis not present

## 2018-02-28 ENCOUNTER — Encounter (HOSPITAL_COMMUNITY): Payer: Self-pay | Admitting: Emergency Medicine

## 2018-02-28 ENCOUNTER — Emergency Department (HOSPITAL_COMMUNITY): Payer: No Typology Code available for payment source

## 2018-02-28 ENCOUNTER — Other Ambulatory Visit: Payer: Self-pay

## 2018-02-28 ENCOUNTER — Emergency Department (HOSPITAL_COMMUNITY)
Admission: EM | Admit: 2018-02-28 | Discharge: 2018-02-28 | Disposition: A | Discharge status: Home / Self Care | Payer: No Typology Code available for payment source | Attending: Emergency Medicine | Admitting: Emergency Medicine

## 2018-02-28 DIAGNOSIS — I10 Essential (primary) hypertension: Secondary | ICD-10-CM | POA: Insufficient documentation

## 2018-02-28 DIAGNOSIS — Z79899 Other long term (current) drug therapy: Secondary | ICD-10-CM | POA: Insufficient documentation

## 2018-02-28 DIAGNOSIS — R079 Chest pain, unspecified: Secondary | ICD-10-CM | POA: Insufficient documentation

## 2018-02-28 LAB — CBC WITH DIFFERENTIAL/PLATELET
Abs Immature Granulocytes: 0.02 10*3/uL (ref 0.00–0.07)
Basophils Absolute: 0 10*3/uL (ref 0.0–0.1)
Basophils Relative: 1 %
EOS ABS: 0.1 10*3/uL (ref 0.0–0.5)
EOS PCT: 2 %
HCT: 41.5 % (ref 36.0–46.0)
Hemoglobin: 13 g/dL (ref 12.0–15.0)
Immature Granulocytes: 0 %
LYMPHS PCT: 12 %
Lymphs Abs: 0.8 10*3/uL (ref 0.7–4.0)
MCH: 27.8 pg (ref 26.0–34.0)
MCHC: 31.3 g/dL (ref 30.0–36.0)
MCV: 88.9 fL (ref 80.0–100.0)
Monocytes Absolute: 0.8 10*3/uL (ref 0.1–1.0)
Monocytes Relative: 13 %
Neutro Abs: 4.5 10*3/uL (ref 1.7–7.7)
Neutrophils Relative %: 72 %
Platelets: 346 10*3/uL (ref 150–400)
RBC: 4.67 MIL/uL (ref 3.87–5.11)
RDW: 12.7 % (ref 11.5–15.5)
WBC: 6.1 10*3/uL (ref 4.0–10.5)
nRBC: 0 % (ref 0.0–0.2)

## 2018-02-28 LAB — BASIC METABOLIC PANEL
Anion gap: 10 (ref 5–15)
BUN: 11 mg/dL (ref 6–20)
CO2: 26 mmol/L (ref 22–32)
Calcium: 8.7 mg/dL — ABNORMAL LOW (ref 8.9–10.3)
Chloride: 100 mmol/L (ref 98–111)
Creatinine, Ser: 0.69 mg/dL (ref 0.44–1.00)
GFR calc Af Amer: >60 mL/min (ref >60)
GFR calc non Af Amer: >60 mL/min (ref >60)
GLUCOSE: 117 mg/dL — AB (ref 70–99)
Potassium: 3.9 mmol/L (ref 3.5–5.1)
Sodium: 136 mmol/L (ref 135–145)

## 2018-02-28 LAB — I-STAT TROPONIN, ED: Troponin i, poc: 0 ng/mL (ref 0.00–0.08)

## 2018-02-28 LAB — D-DIMER, QUANTITATIVE: D-Dimer, Quant: 0.37 ug/mL-FEU (ref 0.00–0.50)

## 2018-02-28 MED ORDER — OSELTAMIVIR PHOSPHATE 75 MG PO CAPS: 75.0000 mg | ORAL_CAPSULE | Freq: Two times a day (BID) | ORAL | 0 refills | Status: DC

## 2018-02-28 MED ORDER — BENZONATATE 100 MG PO CAPS: 200.0000 mg | ORAL_CAPSULE | Freq: Two times a day (BID) | ORAL | 0 refills | Status: DC | PRN

## 2018-02-28 NOTE — ED Provider Notes (Signed)
[facility] DEPT Provider Note   CSN: [id number] Arrival date & time: 02/28/18  0236     History   Chief Complaint Chief Complaint  Patient presents with  . Chest Pain    HPI Amanda Cox is a 43 y.o. female.  Patient presents to the emergency department with a chief complaint of chest pain.  She reports that she has had intermittent symptoms for the past 2 months.  She denies any shortness of breath.  She states that she has had a cough, but denies any fever.  She has not taken anything for symptoms.  She states that she has intermittent lower extremity swelling.  Her symptoms are worsened when she lies down.  Has any history of PE or DVT.  Denies any recent travel or surgery or immobilization.  She does take birth control.  The history is provided by the patient. No language interpreter was used.    Past Medical History:  Diagnosis Date  . Chronic insomnia 11/08/2016  . Dyspareunia   . Essential hypertension, benign   . Genital herpes, unspecified   . Headache syndrome 11/08/2016  . Irregular menstrual cycle   . Surveillance of previously prescribed contraceptive pill     Patient Active Problem List   Diagnosis Date Noted  . Headache syndrome 11/08/2016  . Chronic insomnia 11/08/2016  . Snoring 08/19/2013  . Obesity, unspecified 08/19/2013  . Screening examination for venereal disease 07/10/2013  . Essential hypertension, benign 07/10/2013  . Urinary tract infection, site not specified 07/10/2013  . Candidiasis of vulva and vagina 07/10/2013  . Genital herpes 07/17/2012  . BV (bacterial vaginosis) 07/17/2012    Past Surgical History:  Procedure Laterality Date  . BREAST REDUCTION SURGERY    . CESAREAN SECTION       OB History    Gravida  2   Para  1   Term  1   Preterm      AB  1   Living  1     SAB      TAB  1   Ectopic      Multiple      Live Births  1            Home Medications    Prior to  Admission medications   Medication Sig Start Date End Date Taking? Authorizing Provider  cephALEXin (KEFLEX) 500 MG capsule Take 1 capsule (500 mg total) by mouth 3 (three) times daily. 11/18/17   Jaynee Eagles, PA-C  fluconazole (DIFLUCAN) 200 MG tablet 1 tablet po every other day prn. 07/12/16   [provider]  fluticasone (FLONASE) 50 MCG/ACT nasal spray as needed.  11/02/17   [provider]  furosemide (LASIX) 20 MG tablet Take 1 tablet (20 mg total) by mouth daily. 10/01/17   Raylene Everts, MD  hydrochlorothiazide (HYDRODIURIL) 25 MG tablet TAKE 1 TABLET BY MOUTH EVERY DAY Patient not taking: Reported on 11/08/2017 07/20/17   [name], MD  ibuprofen (ADVIL,MOTRIN) 800 MG tablet Take 1 tablet (800 mg total) by mouth every 8 (eight) hours as needed. 07/31/17   [name], MD  linaclotide Rolan Lipa) 72 MCG capsule Take 1 capsule (72 mcg total) by mouth daily before breakfast. 11/08/17   Pyrtle, Lajuan Lines, MD  lubiprostone (AMITIZA) 8 MCG capsule Take 1 capsule (8 mcg total) by mouth 2 (two) times daily with a meal. 11/29/17   Pyrtle, Lajuan Lines, MD  metroNIDAZOLE (METROGEL) 0.75 % vaginal gel  Place vaginally as needed.  07/12/16   [provider]  Norgestimate-Ethinyl Estradiol Triphasic 0.18/0.215/0.25 MG-25 MCG tab Take by mouth. 07/12/16   [provider]  silver sulfADIAZINE (SILVADENE) 1 % cream Apply 1 application topically daily. 11/18/17   Jaynee Eagles, PA-C  VALTREX 500 MG tablet TAKE 1 TABLET (500 MG TOTAL) BY MOUTH DAILY. Patient taking differently: 500 mg as needed. TAKE 1 TABLET (500 MG TOTAL) BY MOUTH DAILY. 07/31/17   [name], MD    Family History Family History  Problem Relation Age of Onset  . Diabetes Mother   . Hyperlipidemia Mother   . Arthritis Mother   . Diabetes Father   . Diabetes Sister   . Arthritis Paternal Grandmother   . Hypertension Paternal Grandmother   . Congenital heart disease Other   . Colon cancer Neg Hx     . Rectal cancer Neg Hx   . Esophageal cancer Neg Hx   . Stomach cancer Neg Hx   . Liver cancer Neg Hx     Social History Social History   Tobacco Use  . Smoking status: Never Smoker  . Smokeless tobacco: Never Used  Substance Use Topics  . Alcohol use: Yes    Alcohol/week: 0.0 standard drinks    Comment: rare- once every 3 months  . Drug use: No     Allergies   Sulfa antibiotics   Review of Systems Review of Systems  All other systems reviewed and are negative.    Physical Exam Updated Vital Signs BP (!) 152/108 (BP Location: Right Arm)   Pulse (!) 112   Temp 99.9 F (37.7 C) (Oral)   Resp 16   LMP 01/28/2018   SpO2 99%   Physical Exam Vitals signs and nursing note reviewed.  Constitutional:      Appearance: She is well-developed.  HENT:     Head: Normocephalic and atraumatic.  Eyes:     Conjunctiva/sclera: Conjunctivae normal.     Pupils: Pupils are equal, round, and reactive to light.  Neck:     Musculoskeletal: Normal range of motion and neck supple.  Cardiovascular:     Rate and Rhythm: Regular rhythm.     Heart sounds: No murmur. No friction rub. No gallop.      Comments: Tachycardic Pulmonary:     Effort: Pulmonary effort is normal. No respiratory distress.     Breath sounds: Normal breath sounds. No wheezing or rales.  Chest:     Chest wall: No tenderness.  Abdominal:     General: Bowel sounds are normal. There is no distension.     Palpations: Abdomen is soft. There is no mass.     Tenderness: There is no abdominal tenderness. There is no guarding or rebound.  Musculoskeletal: Normal range of motion.        General: No tenderness.  Skin:    General: Skin is warm and dry.  Neurological:     Mental Status: She is alert and oriented to person, place, and time.  Psychiatric:        Behavior: Behavior normal.        Thought Content: Thought content normal.        Judgment: Judgment normal.      ED Treatments / Results  Labs (all  labs ordered are listed, but only abnormal results are displayed) Labs Reviewed  BASIC METABOLIC PANEL - Abnormal; Notable for the following components:      Result Value   Glucose, Bld 117 (*)  Calcium 8.7 (*)    All other components within normal limits  CBC WITH DIFFERENTIAL/PLATELET  D-DIMER, QUANTITATIVE (NOT AT [facility])  I-STAT TROPONIN, ED    EKG EKG Interpretation  Date/Time:  Thursday February 28 2018 04:53:23 EST Ventricular Rate:  96 PR Interval:    QRS Duration: 86 QT Interval:  355 QTC Calculation: 449 R Axis:   68 Text Interpretation:  Sinus rhythm Consider left ventricular hypertrophy Confirmed by Dory Horn) on 02/28/2018 5:22:00 AM   Radiology Dg Chest 2 View  Result Date: 02/28/2018 CLINICAL DATA:  Mid chest pain for 2 months. EXAM: CHEST - 2 VIEW COMPARISON:  01/28/2016 FINDINGS: The cardiomediastinal contours are normal. The lungs are clear. Pulmonary vasculature is normal. No consolidation, pleural effusion, or pneumothorax. No acute osseous abnormalities are seen. IMPRESSION: No active cardiopulmonary disease. Electronically Signed   By: Keith Rake M.D.   On: 02/28/2018 03:29    Procedures Procedures (including critical care time)  Medications Ordered in ED Medications - No data to display   Initial Impression / Assessment and Plan / ED Course  I have reviewed the triage vital signs and the nursing notes.  Pertinent labs & imaging results that were available during my care of the patient were reviewed by me and considered in my medical decision making (see chart for details).     Patient with intermittent chest pain, mild tachycardia, low-grade fever.  Will check labs, EKG, chest x-ray.  Will check d-dimer.  Dimer is negative.  Doubt PE.  I have a low suspicion for ACS.  Patient does have cough, and has low-grade fever.  I am suspicious that she may be developing the flu.  Will prescribe Tamiflu.  Will also give cough medicine.  So  discussed inflammatory processes such as costochondritis.  Final Clinical Impressions(s) / ED Diagnoses   Final diagnoses:  Nonspecific chest pain    ED Discharge Orders         Ordered    oseltamivir (TAMIFLU) 75 MG capsule  Every 12 hours     02/28/18 0454    benzonatate (TESSALON) 100 MG capsule  2 times daily PRN     02/28/18 0543           Montine Circle, PA-C 02/28/18 [place], April, MD 02/28/18 2863

## 2018-02-28 NOTE — ED Triage Notes (Signed)
Pt reports chest pain onset 2 months ago  Is intermittant with cough  Identified as trigger, denies SOB,

## 2018-04-11 ENCOUNTER — Ambulatory Visit (INDEPENDENT_AMBULATORY_CARE_PROVIDER_SITE_OTHER): Payer: Commercial Managed Care - PPO | Admitting: Internal Medicine

## 2018-04-11 ENCOUNTER — Encounter: Payer: Self-pay | Admitting: Internal Medicine

## 2018-04-11 DIAGNOSIS — R0789 Other chest pain: Secondary | ICD-10-CM | POA: Diagnosis not present

## 2018-04-11 DIAGNOSIS — K5900 Constipation, unspecified: Secondary | ICD-10-CM | POA: Insufficient documentation

## 2018-04-11 DIAGNOSIS — E6609 Other obesity due to excess calories: Secondary | ICD-10-CM

## 2018-04-11 DIAGNOSIS — Z6837 Body mass index (BMI) 37.0-37.9, adult: Secondary | ICD-10-CM

## 2018-04-11 DIAGNOSIS — F5104 Psychophysiologic insomnia: Secondary | ICD-10-CM | POA: Diagnosis not present

## 2018-04-11 DIAGNOSIS — R079 Chest pain, unspecified: Secondary | ICD-10-CM | POA: Insufficient documentation

## 2018-04-11 DIAGNOSIS — K5904 Chronic idiopathic constipation: Secondary | ICD-10-CM

## 2018-04-11 DIAGNOSIS — I10 Essential (primary) hypertension: Secondary | ICD-10-CM

## 2018-04-11 MED ORDER — PANTOPRAZOLE SODIUM 40 MG PO TBEC: 40.0000 mg | DELAYED_RELEASE_TABLET | Freq: Every day | ORAL | 0 refills | Status: DC

## 2018-04-11 MED ORDER — LACTULOSE 10 GM/15ML PO SOLN: 10.0000 g | Freq: Every day | ORAL | 11 refills | Status: DC | PRN

## 2018-04-11 NOTE — Assessment & Plan Note (Signed)
Doing weight watchers currently and goal weight around 160.

## 2018-04-11 NOTE — Assessment & Plan Note (Signed)
Rx for protonix as symptoms could be GERD related or esophageal spasm.

## 2018-04-11 NOTE — Assessment & Plan Note (Signed)
Taking benadryl and controlled.

## 2018-04-11 NOTE — Assessment & Plan Note (Signed)
Given information about fiber and talked about water intake. Is exercising daily. Rx for lactulose for constipation to start daily.

## 2018-04-11 NOTE — Assessment & Plan Note (Signed)
BP at goal on hctz 25 mg daily. Recent BMP okay without change.

## 2018-04-11 NOTE — Patient Instructions (Addendum)
We have sent in lactulose to try for the constipation. Take 15 mL daily and if no improvement in 1 week increase to 30 mL daily. You can increase as needed from there or decrease. You cannot take too much of this but if you get diarrhea cut back on how much you are using.   We have sent in pantoprazole to take for the acid which is 1 pill daily. Take this for 1 month and if helping, take for 3 months.    Mediterranean Diet A Mediterranean diet refers to food and lifestyle choices that are based on the traditions of countries located on the [employer]. This way of eating has been shown to help prevent certain conditions and improve outcomes for people who have chronic diseases, like kidney disease and heart disease. What are tips for following this plan? Lifestyle  Cook and eat meals together with your family, when possible.  Drink enough fluid to keep your urine clear or pale yellow.  Be physically active every day. This includes: ? Aerobic exercise like running or swimming. ? Leisure activities like gardening, walking, or housework.  Get 7-8 hours of sleep each night.  If recommended by your health care provider, drink red wine in moderation. This means 1 glass a day for nonpregnant women and 2 glasses a day for men. A glass of wine equals 5 oz (150 mL). Reading food labels   Check the serving size of packaged foods. For foods such as rice and pasta, the serving size refers to the amount of cooked product, not dry.  Check the total fat in packaged foods. Avoid foods that have saturated fat or trans fats.  Check the ingredients list for added sugars, such as corn syrup. Shopping  At the grocery store, buy most of your food from the areas near the walls of the store. This includes: ? Fresh fruits and vegetables (produce). ? Grains, beans, nuts, and seeds. Some of these may be available in unpackaged forms or large amounts (in bulk). ? Fresh seafood. ? Poultry and  eggs. ? Low-fat dairy products.  Buy whole ingredients instead of prepackaged foods.  Buy fresh fruits and vegetables in-season from local farmers markets.  Buy frozen fruits and vegetables in resealable bags.  If you do not have access to quality fresh seafood, buy precooked frozen shrimp or canned fish, such as tuna, salmon, or sardines.  Buy small amounts of raw or cooked vegetables, salads, or olives from the deli or salad bar at your store.  Stock your pantry so you always have certain foods on hand, such as olive oil, canned tuna, canned tomatoes, rice, pasta, and beans. Cooking  Cook foods with extra-virgin olive oil instead of using butter or other vegetable oils.  Have meat as a side dish, and have vegetables or grains as your main dish. This means having meat in small portions or adding small amounts of meat to foods like pasta or stew.  Use beans or vegetables instead of meat in common dishes like chili or lasagna.  Experiment with different cooking methods. Try roasting or broiling vegetables instead of steaming or sauteing them.  Add frozen vegetables to soups, stews, pasta, or rice.  Add nuts or seeds for added healthy fat at each meal. You can add these to yogurt, salads, or vegetable dishes.  Marinate fish or vegetables using olive oil, lemon juice, garlic, and fresh herbs. Meal planning   Plan to eat 1 vegetarian meal one day each week. Try to  work up to 2 vegetarian meals, if possible.  Eat seafood 2 or more times a week.  Have healthy snacks readily available, such as: ? Vegetable sticks with hummus. ? [place] yogurt. ? Fruit and nut trail mix.  Eat balanced meals throughout the week. This includes: ? Fruit: 2-3 servings a day ? Vegetables: 4-5 servings a day ? Low-fat dairy: 2 servings a day ? Fish, poultry, or lean meat: 1 serving a day ? Beans and legumes: 2 or more servings a week ? Nuts and seeds: 1-2 servings a day ? Whole grains: 6-8 servings a  day ? Extra-virgin olive oil: 3-4 servings a day  Limit red meat and sweets to only a few servings a month What are my food choices?  Mediterranean diet ? Recommended ? Grains: Whole-grain pasta. Brown rice. Bulgar wheat. Polenta. Couscous. Whole-wheat bread. [place]. ? Vegetables: Artichokes. Beets. Broccoli. Cabbage. Carrots. Eggplant. Green beans. Chard. Kale. Spinach. Onions. Leeks. Peas. Squash. Tomatoes. Peppers. Radishes. ? Fruits: Apples. Apricots. Avocado. Berries. Bananas. Cherries. Dates. Figs. Grapes. Lemons. Melon. Oranges. Peaches. Plums. Pomegranate. ? Meats and other protein foods: Beans. Almonds. Sunflower seeds. Pine nuts. Peanuts. [place]. Salmon. Scallops. Shrimp. [place]. Tilapia. Clams. Oysters. Eggs. ? Dairy: Low-fat milk. Cheese. Greek yogurt. ? Beverages: Water. Red wine. Herbal tea. ? Fats and oils: Extra virgin olive oil. Avocado oil. Grape seed oil. ? Sweets and desserts: [place] yogurt with honey. Baked apples. Poached pears. Trail mix. ? Seasoning and other foods: Basil. Cilantro. Coriander. Cumin. Mint. Parsley. Sage. Rosemary. Tarragon. Garlic. Oregano. Thyme. Pepper. Balsalmic vinegar. Tahini. Hummus. Tomato sauce. Olives. Mushrooms. ? Limit these ? Grains: Prepackaged pasta or rice dishes. Prepackaged cereal with added sugar. ? Vegetables: Deep fried potatoes (french fries). ? Fruits: Fruit canned in syrup. ? Meats and other protein foods: Beef. Pork. Lamb. Poultry with skin. Hot dogs. [place] Salines. ? Dairy: Ice cream. Sour cream. Whole milk. ? Beverages: Juice. Sugar-sweetened soft drinks. Beer. Liquor and spirits. ? Fats and oils: Butter. Canola oil. Vegetable oil. Beef fat (tallow). Lard. ? Sweets and desserts: Cookies. Cakes. Pies. Candy. ? Seasoning and other foods: Mayonnaise. Premade sauces and marinades. ? The items listed may not be a complete list. Talk with your dietitian about what dietary choices are right for you. Summary  The Mediterranean diet  includes both food and lifestyle choices.  Eat a variety of fresh fruits and vegetables, beans, nuts, seeds, and whole grains.  Limit the amount of red meat and sweets that you eat.  Talk with your health care provider about whether it is safe for you to drink red wine in moderation. This means 1 glass a day for nonpregnant women and 2 glasses a day for men. A glass of wine equals 5 oz (150 mL). This information is not intended to replace advice given to you by your health care provider. Make sure you discuss any questions you have with your health care provider. Document Released: 09/30/2015 Document Revised: 11/02/2015 Document Reviewed: 09/30/2015 Elsevier Interactive Patient Education  2019 Elsevier Inc.    High-Fiber Diet Fiber, also called dietary fiber, is a type of carbohydrate that is found in fruits, vegetables, whole grains, and beans. A high-fiber diet can have many health benefits. Your health care provider may recommend a high-fiber diet to help:  Prevent constipation. Fiber can make your bowel movements more regular.  Lower your cholesterol.  Relieve the following conditions: ? Swelling of veins in the anus (hemorrhoids). ? Swelling and irritation (inflammation) of specific areas of the  digestive tract (uncomplicated diverticulosis). ? A problem of the large intestine (colon) that sometimes causes pain and diarrhea (irritable bowel syndrome, IBS).  Prevent overeating as part of a weight-loss plan.  Prevent heart disease, type 2 diabetes, and certain cancers. What is my plan? The recommended daily fiber intake in grams (g) includes:  38 g for men age 74 or younger.  30 g for men over age 82.  47 g for women age 44 or younger.  21 g for women over age 63. You can get the recommended daily intake of dietary fiber by:  Eating a variety of fruits, vegetables, grains, and beans.  Taking a fiber supplement, if it is not possible to get enough fiber through your  diet. What do I need to know about a high-fiber diet?  It is better to get fiber through food sources rather than from fiber supplements. There is not a lot of research about how effective supplements are.  Always check the fiber content on the nutrition facts label of any prepackaged food. Look for foods that contain 5 g of fiber or more per serving.  Talk with a diet and nutrition specialist (dietitian) if you have questions about specific foods that are recommended or not recommended for your medical condition, especially if those foods are not listed below.  Gradually increase how much fiber you consume. If you increase your intake of dietary fiber too quickly, you may have bloating, cramping, or gas.  Drink plenty of water. Water helps you to digest fiber. What are tips for following this plan?  Eat a wide variety of high-fiber foods.  Make sure that half of the grains that you eat each day are whole grains.  Eat breads and cereals that are made with whole-grain flour instead of refined flour or white flour.  Eat brown rice, bulgur wheat, or millet instead of white rice.  Start the day with a breakfast that is high in fiber, such as a cereal that contains 5 g of fiber or more per serving.  Use beans in place of meat in soups, salads, and pasta dishes.  Eat high-fiber snacks, such as berries, raw vegetables, nuts, and popcorn.  Choose whole fruits and vegetables instead of processed forms like juice or sauce. What foods can I eat?  Fruits Berries. Pears. Apples. Oranges. Avocado. Prunes and raisins. Dried figs. Vegetables Sweet potatoes. Spinach. Kale. Artichokes. Cabbage. Broccoli. Cauliflower. Green peas. Carrots. Squash. Grains Whole-grain breads. Multigrain cereal. Oats and oatmeal. Brown rice. Barley. Bulgur wheat. [place]. Quinoa. Bran muffins. Popcorn. Rye wafer crackers. Meats and other proteins [place], kidney, and pinto beans. Soybeans. Split peas. Lentils. Nuts and  seeds. Dairy Fiber-fortified yogurt. Beverages Fiber-fortified soy milk. Fiber-fortified orange juice. Other foods Fiber bars. The items listed above may not be a complete list of recommended foods and beverages. Contact a dietitian for more options. What foods are not recommended? Fruits Fruit juice. Cooked, strained fruit. Vegetables Fried potatoes. Canned vegetables. Well-cooked vegetables. Grains White bread. Pasta made with refined flour. White rice. Meats and other proteins Fatty cuts of meat. Fried chicken or fried fish. Dairy Milk. Yogurt. Cream cheese. Sour cream. Fats and oils Butters. Beverages Soft drinks. Other foods Cakes and pastries. The items listed above may not be a complete list of foods and beverages to avoid. Contact a dietitian for more information. Summary  Fiber is a type of carbohydrate. It is found in fruits, vegetables, whole grains, and beans.  There are many health benefits of eating a  high-fiber diet, such as preventing constipation, lowering blood cholesterol, helping with weight loss, and reducing your risk of heart disease, diabetes, and certain cancers.  Gradually increase your intake of fiber. Increasing too fast can result in cramping, bloating, and gas. Drink plenty of water while you increase your fiber.  The best sources of fiber include whole fruits and vegetables, whole grains, nuts, seeds, and beans. This information is not intended to replace advice given to you by your health care provider. Make sure you discuss any questions you have with your health care provider. Document Released: 02/06/2005 Document Revised: 12/11/2016 Document Reviewed: 12/11/2016 Elsevier Interactive Patient Education  2019 [employer].

## 2018-04-11 NOTE — Progress Notes (Signed)
   Subjective:   Patient ID: Amanda Cox, female    DOB: May 30, 1975, 43 y.o.   MRN: [id number]  HPI The patient is a new 43 YO female coming in for concerns about chest pain (started about 1 month ago, she was having upper abdominal pain and middle of the chest initially when she sought care in the ER, she had EKG and labs to rule out ACS, PE, without findings, given ibuprofen and advised to see if it resolved and given tamiflu in case this was the beginning of flu, it is better overall but she is still getting some episodes of upper abdomen and low chest pain which lasts about 10 minutes with some tightness and stinging kind of pains, denies taking anything for it) and continuation of hypertension (taking hctz, denies side effects, see chest pains, denies headaches), and constipation (chronic, does not eat much fiber, likes eating cheese but now on weight watchers and is limited in quantity of cheese, has tried miralax otc but not taking now, tried linzess which did not work from GI, they then sent in [place] but she could not afford, denies taking anything now except when she needs to, BM every 1-2 weeks, some hemorrhoids but not inflamed and no blood with defecation).  PMH, [facility], social history reviewed and updated  Review of Systems  Constitutional: Negative.   HENT: Negative.   Eyes: Negative.   Respiratory: Positive for chest tightness. Negative for cough and shortness of breath.   Cardiovascular: Positive for chest pain. Negative for palpitations and leg swelling.  Gastrointestinal: Positive for constipation. Negative for abdominal distention, abdominal pain, anal bleeding, blood in stool, diarrhea, nausea, rectal pain and vomiting.  Musculoskeletal: Negative.   Skin: Negative.   Neurological: Negative.   Psychiatric/Behavioral: Negative for agitation, behavioral problems, decreased concentration, dysphoric mood, sleep disturbance and suicidal ideas. The patient is nervous/anxious.      Objective:  Physical Exam Constitutional:      Appearance: She is well-developed. She is obese.  HENT:     Head: Normocephalic and atraumatic.  Neck:     Musculoskeletal: Normal range of motion.  Cardiovascular:     Rate and Rhythm: Normal rate and regular rhythm.  Pulmonary:     Effort: Pulmonary effort is normal. No respiratory distress.     Breath sounds: Normal breath sounds. No wheezing or rales.  Abdominal:     General: Bowel sounds are normal. There is no distension.     Palpations: Abdomen is soft.     Tenderness: There is no abdominal tenderness. There is no rebound.  Skin:    General: Skin is warm and dry.  Neurological:     Mental Status: She is alert and oriented to person, place, and time.     Coordination: Coordination normal.     Vitals:   04/11/18 1016  BP: 130/80  Pulse: 89  Temp: 98.6 F (37 C)  TempSrc: Oral  SpO2: 98%  Weight: 211 lb (95.7 kg)  Height: 5\' 3"  (1.6 m)    Assessment & Plan:

## 2018-06-05 ENCOUNTER — Other Ambulatory Visit: Payer: Self-pay

## 2018-06-05 ENCOUNTER — Ambulatory Visit (HOSPITAL_COMMUNITY)
Admission: EM | Admit: 2018-06-05 | Discharge: 2018-06-05 | Disposition: A | Discharge status: Home / Self Care | Payer: No Typology Code available for payment source | Attending: Family Medicine | Admitting: Family Medicine

## 2018-06-05 ENCOUNTER — Encounter (HOSPITAL_COMMUNITY): Payer: Self-pay

## 2018-06-05 DIAGNOSIS — M545 Low back pain, unspecified: Secondary | ICD-10-CM

## 2018-06-05 DIAGNOSIS — Z041 Encounter for examination and observation following transport accident: Secondary | ICD-10-CM | POA: Diagnosis not present

## 2018-06-05 MED ORDER — CYCLOBENZAPRINE HCL 10 MG PO TABS: ORAL_TABLET | ORAL | 0 refills | Status: DC

## 2018-06-05 MED ORDER — DICLOFENAC SODIUM 75 MG PO TBEC: 75.0000 mg | DELAYED_RELEASE_TABLET | Freq: Two times a day (BID) | ORAL | 0 refills | Status: DC
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
## 2018-06-05 NOTE — Discharge Instructions (Signed)
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
## 2018-06-23 ENCOUNTER — Other Ambulatory Visit: Payer: Self-pay | Admitting: Obstetrics

## 2018-06-23 DIAGNOSIS — I10 Essential (primary) hypertension: Secondary | ICD-10-CM

## 2018-07-22 ENCOUNTER — Other Ambulatory Visit: Payer: Self-pay | Admitting: Obstetrics

## 2018-07-22 ENCOUNTER — Telehealth: Payer: Self-pay

## 2018-07-22 DIAGNOSIS — N946 Dysmenorrhea, unspecified: Secondary | ICD-10-CM

## 2018-07-22 DIAGNOSIS — B373 Candidiasis of vulva and vagina: Secondary | ICD-10-CM

## 2018-07-22 DIAGNOSIS — I10 Essential (primary) hypertension: Secondary | ICD-10-CM

## 2018-07-22 DIAGNOSIS — A6004 Herpesviral vulvovaginitis: Secondary | ICD-10-CM

## 2018-07-22 DIAGNOSIS — Z3041 Encounter for surveillance of contraceptive pills: Secondary | ICD-10-CM

## 2018-07-22 DIAGNOSIS — B9689 Other specified bacterial agents as the cause of diseases classified elsewhere: Secondary | ICD-10-CM

## 2018-07-22 DIAGNOSIS — B3731 Acute candidiasis of vulva and vagina: Secondary | ICD-10-CM

## 2018-07-22 DIAGNOSIS — N760 Acute vaginitis: Secondary | ICD-10-CM

## 2018-07-22 MED ORDER — FLUCONAZOLE 150 MG PO TABS: 150.0000 mg | ORAL_TABLET | Freq: Once | ORAL | 2 refills | Status: AC

## 2018-07-22 MED ORDER — IBUPROFEN 800 MG PO TABS: 800.0000 mg | ORAL_TABLET | Freq: Three times a day (TID) | ORAL | 5 refills | Status: DC | PRN

## 2018-07-22 MED ORDER — METRONIDAZOLE 0.75 % VA GEL: 1.0000 | Freq: Two times a day (BID) | VAGINAL | 5 refills | Status: DC

## 2018-07-22 MED ORDER — HYDROCHLOROTHIAZIDE 25 MG PO TABS: 25.0000 mg | ORAL_TABLET | Freq: Every day | ORAL | 11 refills | Status: DC

## 2018-07-22 MED ORDER — NORGESTIM-ETH ESTRAD TRIPHASIC 0.18/0.215/0.25 MG-25 MCG PO TABS: 1.0000 | ORAL_TABLET | Freq: Every day | ORAL | 11 refills | Status: DC

## 2018-07-22 MED ORDER — VALTREX 500 MG PO TABS: ORAL_TABLET | ORAL | 11 refills | Status: DC

## 2018-07-22 NOTE — Telephone Encounter (Signed)
Return call to patients requesting refills on Rx that are usually filled when she comes in for her Annual.  Patient last Annual was 07/27/2017 and she did attempt to schedule however Annuals are not being scheduled at this time Pt needs refill on the following medications:  Hydrodiuril 25mg   Birth Control pills Metrogel  Diflucan   [facility] to [facility] on [address].

## 2018-07-22 NOTE — Telephone Encounter (Signed)
Rx's filled

## 2018-07-24 ENCOUNTER — Other Ambulatory Visit: Payer: Self-pay | Admitting: Obstetrics

## 2018-07-24 DIAGNOSIS — Z1231 Encounter for screening mammogram for malignant neoplasm of breast: Secondary | ICD-10-CM

## 2018-07-31 ENCOUNTER — Encounter: Payer: Commercial Managed Care - PPO | Admitting: Internal Medicine

## 2018-08-02 ENCOUNTER — Other Ambulatory Visit: Payer: Self-pay

## 2018-08-02 ENCOUNTER — Encounter: Payer: Self-pay | Admitting: Internal Medicine

## 2018-08-02 ENCOUNTER — Other Ambulatory Visit (INDEPENDENT_AMBULATORY_CARE_PROVIDER_SITE_OTHER): Payer: No Typology Code available for payment source

## 2018-08-02 ENCOUNTER — Ambulatory Visit (INDEPENDENT_AMBULATORY_CARE_PROVIDER_SITE_OTHER): Payer: No Typology Code available for payment source | Admitting: Internal Medicine

## 2018-08-02 VITALS — BP 118/78 | HR 82 | Temp 98.5°F | Ht 63.0 in | Wt 202.0 lb

## 2018-08-02 DIAGNOSIS — Z0000 Encounter for general adult medical examination without abnormal findings: Secondary | ICD-10-CM

## 2018-08-02 DIAGNOSIS — K5904 Chronic idiopathic constipation: Secondary | ICD-10-CM

## 2018-08-02 DIAGNOSIS — I10 Essential (primary) hypertension: Secondary | ICD-10-CM | POA: Diagnosis not present

## 2018-08-02 DIAGNOSIS — F5104 Psychophysiologic insomnia: Secondary | ICD-10-CM | POA: Diagnosis not present

## 2018-08-02 DIAGNOSIS — Z6837 Body mass index (BMI) 37.0-37.9, adult: Secondary | ICD-10-CM

## 2018-08-02 DIAGNOSIS — E6609 Other obesity due to excess calories: Secondary | ICD-10-CM

## 2018-08-02 LAB — COMPREHENSIVE METABOLIC PANEL
ALT: 10 U/L (ref 0–35)
AST: 11 U/L (ref 0–37)
Albumin: 4.3 g/dL (ref 3.5–5.2)
Alkaline Phosphatase: 52 U/L (ref 39–117)
BUN: 9 mg/dL (ref 6–23)
CO2: 30 mEq/L (ref 19–32)
Calcium: 9.5 mg/dL (ref 8.4–10.5)
Chloride: 101 mEq/L (ref 96–112)
Creatinine, Ser: 0.74 mg/dL (ref 0.40–1.20)
GFR: 104.63 mL/min (ref >60.00)
Glucose, Bld: 110 mg/dL — ABNORMAL HIGH (ref 70–99)
Potassium: 4.1 mEq/L (ref 3.5–5.1)
Sodium: 139 mEq/L (ref 135–145)
Total Bilirubin: 1 mg/dL (ref 0.2–1.2)
Total Protein: 7.5 g/dL (ref 6.0–8.3)

## 2018-08-02 LAB — LIPID PANEL
Cholesterol: 199 mg/dL (ref 0–200)
HDL: 75.3 mg/dL (ref >39.00)
LDL Cholesterol: 108 mg/dL — ABNORMAL HIGH (ref 0–99)
NonHDL: 123.99
Total CHOL/HDL Ratio: 3
Triglycerides: 80 mg/dL (ref 0.0–149.0)
VLDL: 16 mg/dL (ref 0.0–40.0)

## 2018-08-02 LAB — CBC
HCT: 38.3 % (ref 36.0–46.0)
Hemoglobin: 12.7 g/dL (ref 12.0–15.0)
MCHC: 33.3 g/dL (ref 30.0–36.0)
MCV: 86.5 fl (ref 78.0–100.0)
Platelets: 386 10*3/uL (ref 150.0–400.0)
RBC: 4.42 Mil/uL (ref 3.87–5.11)
RDW: 13.7 % (ref 11.5–15.5)
WBC: 7.6 10*3/uL (ref 4.0–10.5)

## 2018-08-02 LAB — TSH: TSH: 1.29 u[IU]/mL (ref 0.35–4.50)

## 2018-08-02 LAB — HEMOGLOBIN A1C: Hgb A1c MFr Bld: 5.8 % (ref 4.6–6.5)

## 2018-08-02 NOTE — Patient Instructions (Signed)

## 2018-08-02 NOTE — Assessment & Plan Note (Signed)
Flu shot counseled declines. Tetanus up to date. Mammogram up to date with gyn, pap smear up to date with gyn. Counseled about sun safety and mole surveillance. Counseled about the dangers of distracted driving. Given 10 year screening recommendations.

## 2018-08-02 NOTE — Assessment & Plan Note (Signed)
Lactulose is working well. Adjust as needed.

## 2018-08-02 NOTE — Assessment & Plan Note (Signed)
BMI 35. Working on exercise.

## 2018-08-02 NOTE — Progress Notes (Signed)
   Subjective:   Patient ID: Amanda Cox, female    DOB: June 13, 1975, 43 y.o.   MRN: [id number]  HPI The patient is a 43 YO female coming in for physical.  PMH, [facility], social history reviewed and updated.   Review of Systems  Constitutional: Negative.   HENT: Negative.   Eyes: Negative.   Respiratory: Negative for cough, chest tightness and shortness of breath.   Cardiovascular: Negative for chest pain, palpitations and leg swelling.  Gastrointestinal: Negative for abdominal distention, abdominal pain, constipation, diarrhea, nausea and vomiting.  Musculoskeletal: Negative.   Skin: Negative.   Neurological: Negative.   Psychiatric/Behavioral: Negative.     Objective:  Physical Exam Constitutional:      Appearance: She is well-developed.  HENT:     Head: Normocephalic and atraumatic.  Neck:     Musculoskeletal: Normal range of motion.  Cardiovascular:     Rate and Rhythm: Normal rate and regular rhythm.  Pulmonary:     Effort: Pulmonary effort is normal. No respiratory distress.     Breath sounds: Normal breath sounds. No wheezing or rales.  Abdominal:     General: Bowel sounds are normal. There is no distension.     Palpations: Abdomen is soft.     Tenderness: There is no abdominal tenderness. There is no rebound.  Skin:    General: Skin is warm and dry.  Neurological:     Mental Status: She is alert and oriented to person, place, and time.     Coordination: Coordination normal.     Vitals:   08/02/18 0915  BP: 118/78  Pulse: 82  Temp: 98.5 F (36.9 C)  TempSrc: Oral  Weight: 202 lb (91.6 kg)  Height: 5\' 3"  (1.6 m)    Assessment & Plan:

## 2018-08-02 NOTE — Assessment & Plan Note (Signed)
BP at goal on hctz 25 mg daily. Checking CMP and adjust as needed.

## 2018-08-02 NOTE — Assessment & Plan Note (Addendum)
Using benadryl daily with good relief.

## 2018-08-29 ENCOUNTER — Telehealth: Payer: Self-pay

## 2018-08-29 NOTE — Telephone Encounter (Signed)
Spoke with pt in regards to making appt virtual and pt would rather come into the office to be seen. Pt only has these days available in the month of July and is open to seeing any provider that may have some accomodation. 7/15, 7/16, 7/17, 7/21, 7/24 (evening only)

## 2018-08-30 ENCOUNTER — Ambulatory Visit: Payer: No Typology Code available for payment source | Admitting: Cardiology

## 2018-08-30 NOTE — Telephone Encounter (Signed)
Forwarded message to scheduling to call patient and schedule appointment

## 2018-09-04 ENCOUNTER — Telehealth: Payer: Self-pay

## 2018-09-04 ENCOUNTER — Ambulatory Visit: Payer: No Typology Code available for payment source | Admitting: Cardiovascular Disease

## 2018-09-04 NOTE — Telephone Encounter (Signed)

## 2018-09-05 ENCOUNTER — Encounter: Payer: Self-pay | Admitting: Cardiology

## 2018-09-05 ENCOUNTER — Other Ambulatory Visit: Payer: Self-pay

## 2018-09-05 ENCOUNTER — Encounter (INDEPENDENT_AMBULATORY_CARE_PROVIDER_SITE_OTHER): Payer: Self-pay

## 2018-09-05 ENCOUNTER — Ambulatory Visit (INDEPENDENT_AMBULATORY_CARE_PROVIDER_SITE_OTHER): Payer: No Typology Code available for payment source | Admitting: Cardiology

## 2018-09-05 VITALS — BP 112/80 | HR 69 | Ht 63.0 in | Wt 215.2 lb

## 2018-09-05 DIAGNOSIS — R002 Palpitations: Secondary | ICD-10-CM | POA: Diagnosis not present

## 2018-09-05 NOTE — Patient Instructions (Signed)
Medication Instructions:  The current medical regimen is effective;  continue present plan and medications.  If you need a refill on your cardiac medications before your next appointment, please call your pharmacy.   Testing/Procedures: Your physician has requested that you have an echocardiogram. Echocardiography is a painless test that uses sound waves to create images of your heart. It provides your doctor with information about the size and shape of your heart and how well your heart's chambers and valves are working. This procedure takes approximately one hour. There are no restrictions for this procedure.  Your physician has recommended that you wear a holter monitor for 14 days. Holter monitors are medical devices that record the heart's electrical activity. Doctors most often use these monitors to diagnose arrhythmias. Arrhythmias are problems with the speed or rhythm of the heartbeat. The monitor is a small, portable device. You can wear one while you do your normal daily activities. This is usually used to diagnose what is causing palpitations/syncope (passing out). This will be mailed to your home address  Follow-Up: Follow up as needed after the above testing.  Thank you for choosing [facility]!!

## 2018-09-05 NOTE — Progress Notes (Signed)
Cardiology Office Note:    Date:  09/05/2018   ID:  Amanda Cox, DOB [birth year], MRN [id number]  PCP:  Hoyt Koch, MD  Cardiologist:  Candee Furbish, MD  Electrophysiologist:  None   Referring MD: Hoyt Koch, *   No chief complaint on file. Evaluation of chest pain at the request of Dr. Sharlet Salina  History of Present Illness:    Amanda Cox is a 43 y.o. female with a hx of chest pain with ER visit in January 2020 with an EKG showing sinus rhythm 68 with possible LVH.  Intermittent chest discomfort over the past 2 or 3 months.  No history of clots, d-dimer was low, low suspicion for PE.  Likely costochondritis was diagnosed.  She has not had any further chest pain but now she is having some palpitations at times. Now having skipped beats, makes her catch her breath.  Usually feels this when relaxed.  No high risk symptoms such as syncope.  They do not occur when she is walking on the trails. She takes Benadryl for insomnia but no antihistamines no alcohol non-smoker.  She works for [employer], Therapist, art.  Electrolytes were normal.  Past Medical History:  Diagnosis Date  . Chronic insomnia 11/08/2016  . Dyspareunia   . Essential hypertension, benign   . Genital herpes, unspecified   . Headache syndrome 11/08/2016  . Irregular menstrual cycle   . Surveillance of previously prescribed contraceptive pill     Past Surgical History:  Procedure Laterality Date  . BREAST REDUCTION SURGERY    . CESAREAN SECTION      Current Medications: Current Meds  Medication Sig  . fluconazole (DIFLUCAN) 150 MG tablet Take 150 mg by mouth as needed.  . fluticasone (FLONASE) 50 MCG/ACT nasal spray Place 2 sprays into both nostrils as needed for allergies.   . hydrochlorothiazide (HYDRODIURIL) 25 MG tablet Take 1 tablet (25 mg total) by mouth daily.  Marland Kitchen ibuprofen (ADVIL) 800 MG tablet Take 1 tablet (800 mg total) by mouth every 8 (eight) hours as needed.   . lactulose (CHRONULAC) 10 GM/15ML solution Take 15-45 mLs (10-30 g total) by mouth daily as needed for mild constipation.  . metroNIDAZOLE (METROGEL VAGINAL) 0.75 % vaginal gel Place 1 Applicatorful vaginally 2 (two) times daily.  . Norgestimate-Ethinyl Estradiol Triphasic 0.18/0.215/0.25 MG-25 MCG tab Take 1 tablet by mouth daily.  Marland Kitchen VALTREX 500 MG tablet TAKE 1 TABLET (500 MG TOTAL) BY MOUTH DAILY.     Allergies:   Sulfa antibiotics   Social History   Socioeconomic History  . Marital status: Single    Spouse name: Not on file  . Number of children: 1  . Years of education: College  . Highest education level: Not on file  Occupational History    Employer: [employer]  . Financial resource strain: Not on file  . Food insecurity    Worry: Not on file    Inability: Not on file  . Transportation needs    Medical: Not on file    Non-medical: Not on file  Tobacco Use  . Smoking status: Never Smoker  . Smokeless tobacco: Never Used  Substance and Sexual Activity  . Alcohol use: Yes    Alcohol/week: 0.0 standard drinks    Comment: rare- once every 3 months  . Drug use: No  . Sexual activity: Yes    Partners: Male    Birth control/protection: OCP  Lifestyle  . Physical activity  Days per week: Not on file    Minutes per session: Not on file  . Stress: Not on file  Relationships  . Social Herbalist on phone: Not on file    Gets together: Not on file    Attends religious service: Not on file    Active member of club or organization: Not on file    Attends meetings of clubs or organizations: Not on file    Relationship status: Not on file  Other Topics Concern  . Not on file  Social History Narrative   Patient is single and lives at home, her daughter lives with her.   Patient has a college education.   Patient is right-handed.   Caffeine use:  Soda once per month   Patient has one child.     Family History: The patient's family  history includes Arthritis in her mother and paternal grandmother; Congenital heart disease in an other family member; Diabetes in her father, mother, and sister; Hyperlipidemia in her mother; Hypertension in her paternal grandmother. There is no history of Colon cancer, Rectal cancer, Esophageal cancer, Stomach cancer, or Liver cancer.  ROS:   Please see the history of present illness.     All other systems reviewed and are negative.  EKGs/Labs/Other Studies Reviewed:    The following studies were reviewed today: EKG from the ER very similar to today's.  EKG:  EKG is  ordered today.  The ekg ordered today demonstrates sinus rhythm 69 with borderline LVH.  Recent Labs: 08/02/2018: ALT 10; BUN 9; Creatinine, Ser 0.74; Hemoglobin 12.7; Platelets 386.0; Potassium 4.1; Sodium 139; TSH 1.29  Recent Lipid Panel    Component Value Date/Time   CHOL 199 08/02/2018 0943   TRIG 80.0 08/02/2018 0943   HDL 75.30 08/02/2018 0943   CHOLHDL 3 08/02/2018 0943   VLDL 16.0 08/02/2018 0943   LDLCALC 108 (H) 08/02/2018 0943    Physical Exam:    VS:  BP 112/80   Pulse 69   Ht 5\' 3"  (1.6 m)   Wt 215 lb 3.2 oz (97.6 kg)   BMI 38.12 kg/m     Wt Readings from Last 3 Encounters:  09/05/18 215 lb 3.2 oz (97.6 kg)  08/02/18 202 lb (91.6 kg)  04/11/18 211 lb (95.7 kg)     GEN:  Well nourished, well developed in no acute distress HEENT: Normal NECK: No JVD; No carotid bruits LYMPHATICS: No lymphadenopathy CARDIAC: RRR, no murmurs, rubs, gallops RESPIRATORY:  Clear to auscultation without rales, wheezing or rhonchi  ABDOMEN: Soft, non-tender, non-distended MUSCULOSKELETAL:  No edema; No deformity  SKIN: Warm and dry NEUROLOGIC:  Alert and oriented x 3 PSYCHIATRIC:  Normal affect   ASSESSMENT:    1. Palpitations    PLAN:    In order of problems listed above:  Palpitations - By history, sounds like PVCs.  We will check a ZIO patch monitor to confirm. -I will also check an echocardiogram  to ensure proper structure and function of her heart.  I do not feel strongly that a stress test is needed.  Obviously if typical chest pain begins to occur, we can always proceed with stress test in the future.   Medication Adjustments/Labs and Tests Ordered: Current medicines are reviewed at length with the patient today.  Concerns regarding medicines are outlined above.  Orders Placed This Encounter  Procedures  . LONG TERM MONITOR (3-14 DAYS)  . EKG 12-Lead  . ECHOCARDIOGRAM COMPLETE   No orders  of the defined types were placed in this encounter.   Patient Instructions  Medication Instructions:  The current medical regimen is effective;  continue present plan and medications.  If you need a refill on your cardiac medications before your next appointment, please call your pharmacy.   Testing/Procedures: Your physician has requested that you have an echocardiogram. Echocardiography is a painless test that uses sound waves to create images of your heart. It provides your doctor with information about the size and shape of your heart and how well your heart's chambers and valves are working. This procedure takes approximately one hour. There are no restrictions for this procedure.  Your physician has recommended that you wear a holter monitor for 14 days. Holter monitors are medical devices that record the heart's electrical activity. Doctors most often use these monitors to diagnose arrhythmias. Arrhythmias are problems with the speed or rhythm of the heartbeat. The monitor is a small, portable device. You can wear one while you do your normal daily activities. This is usually used to diagnose what is causing palpitations/syncope (passing out). This will be mailed to your home address  Follow-Up: Follow up as needed after the above testing.  Thank you for choosing [facility]!!         Signed, Candee Furbish, MD  09/05/2018 12:35 PM    [place]

## 2018-09-10 ENCOUNTER — Telehealth: Payer: Self-pay | Admitting: *Deleted

## 2018-09-10 ENCOUNTER — Other Ambulatory Visit: Payer: Self-pay

## 2018-09-10 ENCOUNTER — Ambulatory Visit (HOSPITAL_COMMUNITY): Payer: No Typology Code available for payment source | Attending: Cardiology

## 2018-09-10 DIAGNOSIS — R002 Palpitations: Secondary | ICD-10-CM | POA: Insufficient documentation

## 2018-09-10 NOTE — Telephone Encounter (Signed)
14 day ZIO XT long term holter monitor to be mailed to the patients home.  Instructions reviewed briefly as they are included in the monitor kit. 

## 2018-09-10 NOTE — Telephone Encounter (Signed)
Follow Up   Patient calling in to return missed call from earlier. Please give patient a call back.

## 2018-09-13 ENCOUNTER — Ambulatory Visit
Admission: RE | Admit: 2018-09-13 | Discharge: 2018-09-13 | Disposition: A | Discharge status: Home / Self Care | Payer: No Typology Code available for payment source | Source: Ambulatory Visit | Attending: Obstetrics | Admitting: Obstetrics

## 2018-09-13 ENCOUNTER — Other Ambulatory Visit: Payer: Self-pay

## 2018-09-13 DIAGNOSIS — Z1231 Encounter for screening mammogram for malignant neoplasm of breast: Secondary | ICD-10-CM

## 2018-09-14 ENCOUNTER — Ambulatory Visit (INDEPENDENT_AMBULATORY_CARE_PROVIDER_SITE_OTHER): Payer: No Typology Code available for payment source

## 2018-09-14 DIAGNOSIS — R002 Palpitations: Secondary | ICD-10-CM | POA: Diagnosis not present

## 2018-10-08 ENCOUNTER — Telehealth: Payer: Self-pay | Admitting: Cardiology

## 2018-10-08 NOTE — Telephone Encounter (Signed)
No results noted in chart to call patient with.  Will check with Shelly/Katie to see if it's been received back yet.

## 2018-10-08 NOTE — Telephone Encounter (Signed)
New Message   Patient is calling to check on the results of her 14 day holter monitor. Please call to discuss.

## 2018-10-09 NOTE — Telephone Encounter (Signed)
Reviewed results in detail with patient who states understanding.

## 2018-10-09 NOTE — Telephone Encounter (Signed)
   Rhythm is sinus. No atrial fibrillation.  Symptoms of palpitations do correlate with PVCs or premature ventricular contractions. PVCs are rare.  No ventricular tachycardia, no pauses, no adverse arrhythmias.   Symptoms correlate with PVCs.  Would recommend conservative management.  Try to avoid caffeine.  Daily exercise. Overall reassuring.  The above notes are from Dr Marlou Porch.

## 2018-10-11 ENCOUNTER — Ambulatory Visit: Payer: No Typology Code available for payment source | Admitting: Obstetrics

## 2018-10-25 ENCOUNTER — Encounter (HOSPITAL_COMMUNITY): Payer: Self-pay | Admitting: Emergency Medicine

## 2018-10-25 ENCOUNTER — Ambulatory Visit (HOSPITAL_COMMUNITY)
Admission: EM | Admit: 2018-10-25 | Discharge: 2018-10-25 | Disposition: A | Discharge status: Home / Self Care | Payer: No Typology Code available for payment source | Attending: Family Medicine | Admitting: Family Medicine

## 2018-10-25 DIAGNOSIS — N3001 Acute cystitis with hematuria: Secondary | ICD-10-CM | POA: Insufficient documentation

## 2018-10-25 LAB — POCT URINALYSIS DIP (DEVICE)
Bilirubin Urine: NEGATIVE
Glucose, UA: NEGATIVE mg/dL
Ketones, ur: NEGATIVE mg/dL
Leukocytes,Ua: NEGATIVE
Nitrite: NEGATIVE
Protein, ur: NEGATIVE mg/dL
Specific Gravity, Urine: >=1.03 (ref 1.005–1.030)
Urobilinogen, UA: 0.2 mg/dL (ref 0.0–1.0)
pH: 6 (ref 5.0–8.0)

## 2018-10-25 MED ORDER — FLUCONAZOLE 150 MG PO TABS: 150.0000 mg | ORAL_TABLET | Freq: Every day | ORAL | 0 refills | Status: DC

## 2018-10-25 MED ORDER — NITROFURANTOIN MONOHYD MACRO 100 MG PO CAPS: 100.0000 mg | ORAL_CAPSULE | Freq: Two times a day (BID) | ORAL | 0 refills | Status: DC

## 2018-10-25 NOTE — Discharge Instructions (Signed)
Drink plenty of fluids Take antibiotic 3 times a day Take the Diflucan if needed for yeast infection

## 2018-10-25 NOTE — ED Triage Notes (Signed)
Pt c/o urinary freqency x1 week, states her back was hurting too but not anymore.

## 2018-10-25 NOTE — ED Provider Notes (Signed)
[place]    CSN: [id number] Arrival date & time: 10/25/18  1839      History   Chief Complaint Chief Complaint  Patient presents with  . Urinary Frequency    HPI Amanda Cox is a 43 y.o. female.   HPI  Patient is here for a bladder infection.  She is urinating frequently.  Mild dysuria.  No abdominal pain.  No nausea or vomiting.  No back or flank pain.  She states that she does not think she has an STD or vaginal infection.  Past Medical History:  Diagnosis Date  . Chronic insomnia 11/08/2016  . Dyspareunia   . Essential hypertension, benign   . Genital herpes, unspecified   . Headache syndrome 11/08/2016  . Irregular menstrual cycle   . Surveillance of previously prescribed contraceptive pill     Patient Active Problem List   Diagnosis Date Noted  . Routine general medical examination at a health care facility 08/02/2018  . Constipation 04/11/2018  . Chronic insomnia 11/08/2016  . Snoring 08/19/2013  . Obesity, unspecified 08/19/2013  . Essential hypertension, benign 07/10/2013  . Genital herpes 07/17/2012    Past Surgical History:  Procedure Laterality Date  . BREAST REDUCTION SURGERY    . CESAREAN SECTION      OB History    Gravida  2   Para  1   Term  1   Preterm      AB  1   Living  1     SAB      TAB  1   Ectopic      Multiple      Live Births  1            Home Medications    Prior to Admission medications   Medication Sig Start Date End Date Taking? Authorizing Provider  fluconazole (DIFLUCAN) 150 MG tablet Take 1 tablet (150 mg total) by mouth daily. Repeat in 1 week if needed 10/25/18   Raylene Everts, MD  hydrochlorothiazide (HYDRODIURIL) 25 MG tablet Take 1 tablet (25 mg total) by mouth daily. 07/22/18   [name], MD  ibuprofen (ADVIL) 800 MG tablet Take 1 tablet (800 mg total) by mouth every 8 (eight) hours as needed. 07/22/18   [name], MD  nitrofurantoin,  macrocrystal-monohydrate, (MACROBID) 100 MG capsule Take 1 capsule (100 mg total) by mouth 2 (two) times daily. 10/25/18   Raylene Everts, MD  Norgestimate-Ethinyl Estradiol Triphasic 0.18/0.215/0.25 MG-25 MCG tab Take 1 tablet by mouth daily. 07/22/18   [name], MD  fluticasone (FLONASE) 50 MCG/ACT nasal spray Place 2 sprays into both nostrils as needed for allergies.  11/02/17 10/25/18  [provider]  VALTREX 500 MG tablet TAKE 1 TABLET (500 MG TOTAL) BY MOUTH DAILY. 07/22/18 10/25/18  [name], MD    Family History Family History  Problem Relation Age of Onset  . Diabetes Mother   . Hyperlipidemia Mother   . Arthritis Mother   . Diabetes Father   . Diabetes Sister   . Arthritis Paternal Grandmother   . Hypertension Paternal Grandmother   . Congenital heart disease Other   . Colon cancer Neg Hx   . Rectal cancer Neg Hx   . Esophageal cancer Neg Hx   . Stomach cancer Neg Hx   . Liver cancer Neg Hx     Social History Social History   Tobacco Use  . Smoking status: Never Smoker  .  Smokeless tobacco: Never Used  Substance Use Topics  . Alcohol use: Yes    Alcohol/week: 0.0 standard drinks    Comment: rare- once every 3 months  . Drug use: No     Allergies   Sulfa antibiotics   Review of Systems Review of Systems  Constitutional: Negative for chills and fever.  HENT: Negative for ear pain and sore throat.   Eyes: Negative for pain and visual disturbance.  Respiratory: Negative for cough and shortness of breath.   Cardiovascular: Negative for chest pain and palpitations.  Gastrointestinal: Negative for abdominal pain and vomiting.  Genitourinary: Positive for frequency. Negative for dysuria and hematuria.  Musculoskeletal: Negative for arthralgias and back pain.  Skin: Negative for color change and rash.  Neurological: Negative for seizures and syncope.  All other systems reviewed and are negative.    Physical Exam Triage Vital Signs ED  Triage Vitals  Enc Vitals Group     BP 10/25/18 1947 134/88     Pulse Rate 10/25/18 1947 87     Resp 10/25/18 1947 16     Temp 10/25/18 1947 97.8 F (36.6 C)     Temp src --      SpO2 10/25/18 1947 99 %     Weight --      Height --      Head Circumference --      Peak Flow --      Pain Score 10/25/18 1948 0     Pain Loc --      Pain Edu? --      Excl. in [place]? --    No data found.  Updated Vital Signs BP 134/88   Pulse 87   Temp 97.8 F (36.6 C)   Resp 16   SpO2 99%   Visual Acuity Right Eye Distance:   Left Eye Distance:   Bilateral Distance:    Right Eye Near:   Left Eye Near:    Bilateral Near:     Physical Exam Constitutional:      General: She is not in acute distress.    Appearance: She is well-developed.  HENT:     Head: Normocephalic and atraumatic.  Eyes:     Conjunctiva/sclera: Conjunctivae normal.     Pupils: Pupils are equal, round, and reactive to light.  Neck:     Musculoskeletal: Normal range of motion.  Cardiovascular:     Rate and Rhythm: Normal rate.  Pulmonary:     Effort: Pulmonary effort is normal. No respiratory distress.  Abdominal:     General: There is no distension.     Palpations: Abdomen is soft.  Musculoskeletal: Normal range of motion.  Skin:    General: Skin is warm and dry.  Neurological:     Mental Status: She is alert.      UC Treatments / Results  Labs (all labs ordered are listed, but only abnormal results are displayed) Labs Reviewed  POCT URINALYSIS DIP (DEVICE) - Abnormal; Notable for the following components:      Result Value   Hgb urine dipstick TRACE (*)    All other components within normal limits  URINE CULTURE    EKG   Radiology No results found.  Procedures Procedures (including critical care time)  Medications Ordered in UC Medications - No data to display  Initial Impression / Assessment and Plan / UC Course  I have reviewed the triage vital signs and the nursing notes.  Pertinent  labs & imaging results that were  available during my care of the patient were reviewed by me and considered in my medical decision making (see chart for details).     Does have slight hematuria.  Will send urine for culture.  Likely urinary tract infection.  She states that antibiotics did give her a yeast infection so I will include Diflucan with her prescriptions.  She will return as needed Final Clinical Impressions(s) / UC Diagnoses   Final diagnoses:  Acute cystitis with hematuria     Discharge Instructions     Drink plenty of fluids Take antibiotic 3 times a day Take the Diflucan if needed for yeast infection   ED Prescriptions    Medication Sig Dispense Auth. Provider   fluconazole (DIFLUCAN) 150 MG tablet Take 1 tablet (150 mg total) by mouth daily. Repeat in 1 week if needed 2 tablet Raylene Everts, MD   nitrofurantoin, macrocrystal-monohydrate, (MACROBID) 100 MG capsule Take 1 capsule (100 mg total) by mouth 2 (two) times daily. 10 capsule Raylene Everts, MD     Controlled Substance Prescriptions [place] Controlled Substance Registry consulted? Not Applicable   Raylene Everts, MD 10/25/18 2147

## 2018-10-27 LAB — URINE CULTURE: Culture: NO GROWTH

## 2018-11-01 ENCOUNTER — Ambulatory Visit (HOSPITAL_COMMUNITY)
Admission: EM | Admit: 2018-11-01 | Discharge: 2018-11-01 | Disposition: A | Discharge status: Home / Self Care | Payer: No Typology Code available for payment source | Attending: Emergency Medicine | Admitting: Emergency Medicine

## 2018-11-01 ENCOUNTER — Encounter (HOSPITAL_COMMUNITY): Payer: Self-pay

## 2018-11-01 ENCOUNTER — Other Ambulatory Visit: Payer: Self-pay

## 2018-11-01 DIAGNOSIS — R350 Frequency of micturition: Secondary | ICD-10-CM | POA: Diagnosis present

## 2018-11-01 DIAGNOSIS — Z3202 Encounter for pregnancy test, result negative: Secondary | ICD-10-CM

## 2018-11-01 LAB — POCT URINALYSIS DIP (DEVICE)
Bilirubin Urine: NEGATIVE
Glucose, UA: NEGATIVE mg/dL
Ketones, ur: NEGATIVE mg/dL
Leukocytes,Ua: NEGATIVE
Nitrite: NEGATIVE
Protein, ur: NEGATIVE mg/dL
Specific Gravity, Urine: >=1.03 (ref 1.005–1.030)
Urobilinogen, UA: 0.2 mg/dL (ref 0.0–1.0)
pH: 6 (ref 5.0–8.0)

## 2018-11-01 LAB — POCT PREGNANCY, URINE: Preg Test, Ur: NEGATIVE

## 2018-11-01 LAB — GLUCOSE, CAPILLARY: Glucose-Capillary: 100 mg/dL — ABNORMAL HIGH (ref 70–99)

## 2018-11-01 NOTE — Discharge Instructions (Signed)
Your urine still is not indicating UTI. I have repeated the culture to ensure this again.  It does appear concentrated however, so I would recommend increasing how much water you are drinking.  Sometimes constipation can contribute to urinary frequency.  We will test your vagina to ensure vaginal symptoms are not contributing.  I would recommend follow up with your PCP for recheck if persistent as may need further evaluation.  Your blood sugar looks well.

## 2018-11-01 NOTE — ED Triage Notes (Signed)
Patient states she was seeing on 10/25/2018 urinary frequency and mild dysuria, they prescribed Macrobid, and she already finished, but she states her symptoms haven't improve.

## 2018-11-01 NOTE — ED Provider Notes (Signed)
Amanda Cox    CSN: [id number] Arrival date & time: 11/01/18  [place]      History   Chief Complaint Chief Complaint  Patient presents with  . Urinary Frequency    HPI Amanda Cox is a 43 y.o. female.   Amanda Cox presents with complaints of urinary frequency. This has been ongoing for approximately 2 weeks. She was seen here 9/4 and given macrobid, which did not help. Culture at that time returned with no growth. States she feels like she might have some urethral "inflammation" but denies any pelvic pain or burning with urination. Denies sores, lesions or rash to vulva/vagina. Hasn't been sexually active " in a long time." and feels she has has been screened for stds since last encounter. Maybe a light vaginal itching, took diflucan after the macrobid, which was just two days ago. She had recently been on a 10 day smoothie cleanse. Now is still drinking water and cranberry/pineapple juice regularly. No specific gi complaints. Denies any previous similar. States has had UTI's in the past but typically has more discomfort with these. History  Of dyspareunia, htn, herpes, irregular cycles.      ROS per HPI, negative if not otherwise mentioned.      Past Medical History:  Diagnosis Date  . Chronic insomnia 11/08/2016  . Dyspareunia   . Essential hypertension, benign   . Genital herpes, unspecified   . Headache syndrome 11/08/2016  . Irregular menstrual cycle   . Surveillance of previously prescribed contraceptive pill     Patient Active Problem List   Diagnosis Date Noted  . Routine general medical examination at a health care facility 08/02/2018  . Constipation 04/11/2018  . Chronic insomnia 11/08/2016  . Snoring 08/19/2013  . Obesity, unspecified 08/19/2013  . Essential hypertension, benign 07/10/2013  . Genital herpes 07/17/2012    Past Surgical History:  Procedure Laterality Date  . BREAST REDUCTION SURGERY    . CESAREAN SECTION       OB History    Gravida  2   Para  1   Term  1   Preterm      AB  1   Living  1     SAB      TAB  1   Ectopic      Multiple      Live Births  1            Home Medications    Prior to Admission medications   Medication Sig Start Date End Date Taking? Authorizing Provider  fluconazole (DIFLUCAN) 150 MG tablet Take 1 tablet (150 mg total) by mouth daily. Repeat in 1 week if needed 10/25/18   Raylene Everts, MD  hydrochlorothiazide (HYDRODIURIL) 25 MG tablet Take 1 tablet (25 mg total) by mouth daily. 07/22/18   [name], MD  ibuprofen (ADVIL) 800 MG tablet Take 1 tablet (800 mg total) by mouth every 8 (eight) hours as needed. 07/22/18   [name], MD  nitrofurantoin, macrocrystal-monohydrate, (MACROBID) 100 MG capsule Take 1 capsule (100 mg total) by mouth 2 (two) times daily. 10/25/18   Raylene Everts, MD  Norgestimate-Ethinyl Estradiol Triphasic 0.18/0.215/0.25 MG-25 MCG tab Take 1 tablet by mouth daily. 07/22/18   [name], MD  fluticasone (FLONASE) 50 MCG/ACT nasal spray Place 2 sprays into both nostrils as needed for allergies.  11/02/17 10/25/18  [provider]  VALTREX 500 MG tablet TAKE 1 TABLET (500 MG TOTAL) BY MOUTH  DAILY. 07/22/18 10/25/18  [name], MD    Family History Family History  Problem Relation Age of Onset  . Diabetes Mother   . Hyperlipidemia Mother   . Arthritis Mother   . Diabetes Father   . Diabetes Sister   . Arthritis Paternal Grandmother   . Hypertension Paternal Grandmother   . Congenital heart disease Other   . Colon cancer Neg Hx   . Rectal cancer Neg Hx   . Esophageal cancer Neg Hx   . Stomach cancer Neg Hx   . Liver cancer Neg Hx     Social History Social History   Tobacco Use  . Smoking status: Never Smoker  . Smokeless tobacco: Never Used  Substance Use Topics  . Alcohol use: Yes    Alcohol/week: 0.0 standard drinks    Comment: rare- once every 3 months  . Drug use: No      Allergies   Sulfa antibiotics   Review of Systems Review of Systems   Physical Exam Triage Vital Signs ED Triage Vitals  Enc Vitals Group     BP 11/01/18 1859 (!) 137/91     Pulse Rate 11/01/18 1859 83     Resp 11/01/18 1859 16     Temp 11/01/18 1859 97.9 F (36.6 C)     Temp Source 11/01/18 1859 Temporal     SpO2 11/01/18 1859 100 %     Weight --      Height --      Head Circumference --      Peak Flow --      Pain Score 11/01/18 1857 0     Pain Loc --      Pain Edu? --      Excl. in [place]? --    No data found.  Updated Vital Signs BP (!) 137/91 (BP Location: Left Arm)   Pulse 83   Temp 97.9 F (36.6 C) (Temporal)   Resp 16   SpO2 100%   Visual Acuity Right Eye Distance:   Left Eye Distance:   Bilateral Distance:    Right Eye Near:   Left Eye Near:    Bilateral Near:     Physical Exam Constitutional:      General: She is not in acute distress.    Appearance: She is well-developed.  Cardiovascular:     Rate and Rhythm: Normal rate.  Pulmonary:     Effort: Pulmonary effort is normal.  Abdominal:     Palpations: Abdomen is soft. Abdomen is not rigid.     Tenderness: There is no abdominal tenderness. There is no right CVA tenderness, left CVA tenderness, guarding or rebound.  Genitourinary:    Comments: Denies sores, lesions, vaginal bleeding; no pelvic pain; gu exam deferred at this time, vaginal self swab collected.   Skin:    General: Skin is warm and dry.  Neurological:     Mental Status: She is alert and oriented to person, place, and time.      UC Treatments / Results  Labs (all labs ordered are listed, but only abnormal results are displayed) Labs Reviewed  GLUCOSE, CAPILLARY - Abnormal; Notable for the following components:      Result Value   Glucose-Capillary 100 (*)    All other components within normal limits  POCT URINALYSIS DIP (DEVICE) - Abnormal; Notable for the following components:   Hgb urine dipstick TRACE (*)    All other  components within normal limits  URINE CULTURE  POC URINE  PREG, ED  CBG MONITORING, ED  POCT PREGNANCY, URINE  CERVICOVAGINAL ANCILLARY ONLY    EKG   Radiology No results found.  Procedures Procedures (including critical care time)  Medications Ordered in UC Medications - No data to display  Initial Impression / Assessment and Plan / UC Course  I have reviewed the triage vital signs and the nursing notes.  Pertinent labs & imaging results that were available during my care of the patient were reviewed by me and considered in my medical decision making (see chart for details).     Blood sugar unremarkable. Urine dip with trace hgb again, no leuks or nitrite. Elevated SG as well. Will repeat culture. Low suspicion for UTI. Dehydration, constipation, vaginal source of discomfort considered. Vaginal cytology pending. Will notify of any positive findings and if any changes to treatment are needed.  Encouraged increase fluid intake. Encouraged follow up with PCP for recheck. Return precautions provided. Patient verbalized understanding and agreeable to plan.  Ambulatory out of clinic without difficulty.    Final Clinical Impressions(s) / UC Diagnoses   Final diagnoses:  Urinary frequency     Discharge Instructions     Your urine still is not indicating UTI. I have repeated the culture to ensure this again.  It does appear concentrated however, so I would recommend increasing how much water you are drinking.  Sometimes constipation can contribute to urinary frequency.  We will test your vagina to ensure vaginal symptoms are not contributing.  I would recommend follow up with your PCP for recheck if persistent as may need further evaluation.  Your blood sugar looks well.    ED Prescriptions    None     Controlled Substance Prescriptions [place] Controlled Substance Registry consulted? Not Applicable   Zigmund Gottron, NP 11/01/18 [phone]

## 2018-11-03 LAB — URINE CULTURE: Culture: NO GROWTH

## 2018-11-05 LAB — CERVICOVAGINAL ANCILLARY ONLY
Bacterial vaginitis: NEGATIVE
Candida vaginitis: NEGATIVE
Chlamydia: NEGATIVE
Neisseria Gonorrhea: NEGATIVE
Trichomonas: NEGATIVE

## 2019-01-17 ENCOUNTER — Ambulatory Visit (HOSPITAL_COMMUNITY)
Admission: EM | Admit: 2019-01-17 | Discharge: 2019-01-17 | Disposition: A | Discharge status: Home / Self Care | Payer: No Typology Code available for payment source | Attending: Family Medicine | Admitting: Family Medicine

## 2019-01-17 ENCOUNTER — Other Ambulatory Visit: Payer: Self-pay

## 2019-01-17 ENCOUNTER — Encounter (HOSPITAL_COMMUNITY): Payer: Self-pay

## 2019-01-17 DIAGNOSIS — Z792 Long term (current) use of antibiotics: Secondary | ICD-10-CM | POA: Insufficient documentation

## 2019-01-17 DIAGNOSIS — Z882 Allergy status to sulfonamides status: Secondary | ICD-10-CM | POA: Diagnosis not present

## 2019-01-17 DIAGNOSIS — Z8249 Family history of ischemic heart disease and other diseases of the circulatory system: Secondary | ICD-10-CM | POA: Insufficient documentation

## 2019-01-17 DIAGNOSIS — Z79899 Other long term (current) drug therapy: Secondary | ICD-10-CM | POA: Diagnosis not present

## 2019-01-17 DIAGNOSIS — E669 Obesity, unspecified: Secondary | ICD-10-CM | POA: Insufficient documentation

## 2019-01-17 DIAGNOSIS — Z833 Family history of diabetes mellitus: Secondary | ICD-10-CM | POA: Insufficient documentation

## 2019-01-17 DIAGNOSIS — J039 Acute tonsillitis, unspecified: Secondary | ICD-10-CM | POA: Diagnosis not present

## 2019-01-17 DIAGNOSIS — I10 Essential (primary) hypertension: Secondary | ICD-10-CM | POA: Diagnosis not present

## 2019-01-17 DIAGNOSIS — Z20828 Contact with and (suspected) exposure to other viral communicable diseases: Secondary | ICD-10-CM | POA: Diagnosis not present

## 2019-01-17 LAB — POCT RAPID STREP A: Streptococcus, Group A Screen (Direct): NEGATIVE

## 2019-01-17 MED ORDER — AMOXICILLIN 875 MG PO TABS: 875.0000 mg | ORAL_TABLET | Freq: Two times a day (BID) | ORAL | 0 refills | Status: AC

## 2019-01-17 NOTE — Discharge Instructions (Addendum)
Drink plenty of fluids May take over-the-counter cough and cold medicine Your test results will come to my chart If your throat culture is negative, you may stop the antibiotic as soon as you feel well.  If your culture is positive for strep you must take 10 full days of antibiotics Return if worse at any time instead of better Call for questions

## 2019-01-17 NOTE — ED Triage Notes (Signed)
Pt presents with nasal pain/pressure, headache, and sore throat X 2 weeks with no relief with OTC medication.

## 2019-01-17 NOTE — ED Provider Notes (Signed)
Cyril    CSN: [id number] Arrival date & time: 01/17/19  [id number]      History   Chief Complaint Chief Complaint  Patient presents with   URI   Sore Throat    HPI Amanda Cox is a 43 y.o. female.   HPI  Patient states she got a "cold" from her teenage daughter.  They are both still symptomatic.  Initially had some nasal pain and pressure, nasal congestion, postnasal drip, and headache.  Now she has a painful sore throat.  Pain with swallowing.  Feels some fatigue.  No body aches.  No sweats chills or fever.  No known exposure to coronavirus.  No change in taste or smell.  She has been taking over-the-counter cough and cold medicine with no improvement.  Symptoms are going on for 2 weeks, into the third week now.  Past Medical History:  Diagnosis Date   Chronic insomnia 11/08/2016   Dyspareunia    Essential hypertension, benign    Genital herpes, unspecified    Headache syndrome 11/08/2016   Irregular menstrual cycle    Surveillance of previously prescribed contraceptive pill     Patient Active Problem List   Diagnosis Date Noted   Routine general medical examination at a health care facility 08/02/2018   Constipation 04/11/2018   Chronic insomnia 11/08/2016   Snoring 08/19/2013   Obesity, unspecified 08/19/2013   Essential hypertension, benign 07/10/2013   Genital herpes 07/17/2012    Past Surgical History:  Procedure Laterality Date   BREAST REDUCTION SURGERY     CESAREAN SECTION      OB History    Gravida  2   Para  1   Term  1   Preterm      AB  1   Living  1     SAB      TAB  1   Ectopic      Multiple      Live Births  1            Home Medications    Prior to Admission medications   Medication Sig Start Date End Date Taking? Authorizing Provider  amoxicillin (AMOXIL) 875 MG tablet Take 1 tablet (875 mg total) by mouth 2 (two) times daily for 10 days. 01/17/19 01/27/19  Raylene Everts,  MD  hydrochlorothiazide (HYDRODIURIL) 25 MG tablet Take 1 tablet (25 mg total) by mouth daily. 07/22/18   [name], MD  ibuprofen (ADVIL) 800 MG tablet Take 1 tablet (800 mg total) by mouth every 8 (eight) hours as needed. 07/22/18   [name], MD  Norgestimate-Ethinyl Estradiol Triphasic 0.18/0.215/0.25 MG-25 MCG tab Take 1 tablet by mouth daily. 07/22/18   [name], MD  fluticasone (FLONASE) 50 MCG/ACT nasal spray Place 2 sprays into both nostrils as needed for allergies.  11/02/17 10/25/18  [provider]  VALTREX 500 MG tablet TAKE 1 TABLET (500 MG TOTAL) BY MOUTH DAILY. 07/22/18 10/25/18  [name], MD    Family History Family History  Problem Relation Age of Onset   Diabetes Mother    Hyperlipidemia Mother    Arthritis Mother    Diabetes Father    Diabetes Sister    Arthritis Paternal Grandmother    Hypertension Paternal Grandmother    Congenital heart disease Other    Colon cancer Neg Hx    Rectal cancer Neg Hx    Esophageal cancer Neg Hx    Stomach cancer Neg Hx  Liver cancer Neg Hx     Social History Social History   Tobacco Use   Smoking status: Never Smoker   Smokeless tobacco: Never Used  Substance Use Topics   Alcohol use: Yes    Alcohol/week: 0.0 standard drinks    Comment: rare- once every 3 months   Drug use: No     Allergies   Sulfa antibiotics   Review of Systems Review of Systems  Constitutional: Positive for fatigue. Negative for chills and fever.  HENT: Positive for congestion, rhinorrhea, sinus pressure and sore throat. Negative for ear pain.   Eyes: Negative for pain and visual disturbance.  Respiratory: Negative for cough and shortness of breath.   Cardiovascular: Negative for chest pain and palpitations.  Gastrointestinal: Negative for abdominal pain and vomiting.  Genitourinary: Negative for dysuria and hematuria.  Musculoskeletal: Negative for arthralgias, back pain and myalgias.  Skin:  Negative for color change and rash.  Neurological: Positive for headaches. Negative for seizures and syncope.  All other systems reviewed and are negative.    Physical Exam Triage Vital Signs ED Triage Vitals  Enc Vitals Group     BP 01/17/19 0946 123/84     Pulse Rate 01/17/19 0946 75     Resp 01/17/19 0946 17     Temp 01/17/19 0946 98.3 F (36.8 C)     Temp Source 01/17/19 0946 Oral     SpO2 01/17/19 0946 100 %     Weight --      Height --      Head Circumference --      Peak Flow --      Pain Score 01/17/19 0950 6     Pain Loc --      Pain Edu? --      Excl. in [place]? --    No data found.  Updated Vital Signs BP 123/84 (BP Location: Left Arm)    Pulse 75    Temp 98.3 F (36.8 C) (Oral)    Resp 17    LMP 01/14/2019    SpO2 100%       Physical Exam Constitutional:      General: She is not in acute distress.    Appearance: She is well-developed.  HENT:     Head: Normocephalic and atraumatic.     Right Ear: Tympanic membrane and ear canal normal.     Left Ear: Tympanic membrane and ear canal normal.     Nose: No congestion or rhinorrhea.     Mouth/Throat:     Mouth: Mucous membranes are moist.     Pharynx: Posterior oropharyngeal erythema present.     Tonsils: No tonsillar exudate or tonsillar abscesses. 3+ on the right. 3+ on the left.  Eyes:     Conjunctiva/sclera: Conjunctivae normal.     Pupils: Pupils are equal, round, and reactive to light.  Neck:     Musculoskeletal: Normal range of motion.  Cardiovascular:     Rate and Rhythm: Normal rate and regular rhythm.  Pulmonary:     Effort: Pulmonary effort is normal. No respiratory distress.     Breath sounds: Normal breath sounds.  Abdominal:     General: There is no distension.     Palpations: Abdomen is soft.  Musculoskeletal: Normal range of motion.  Lymphadenopathy:     Cervical: Cervical adenopathy present.  Skin:    General: Skin is warm and dry.  Neurological:     Mental Status: She is alert.    Psychiatric:  Mood and Affect: Mood normal.        Behavior: Behavior normal.      UC Treatments / Results  Labs (all labs ordered are listed, but only abnormal results are displayed) Labs Reviewed  NOVEL CORONAVIRUS, NAA (HOSP ORDER, SEND-OUT TO REF LAB; TAT 18-24 HRS)  CULTURE, GROUP A STREP [facility])  POCT RAPID STREP A    EKG   Radiology No results found.  Procedures Procedures (including critical care time)  Medications Ordered in UC Medications - No data to display  Initial Impression / Assessment and Plan / UC Course  I have reviewed the triage vital signs and the nursing notes.  Pertinent labs & imaging results that were available during my care of the patient were reviewed by me and considered in my medical decision making (see chart for details).     Tonsils are large and erythematous without exudate.  Rapid strep test is negative.  Will send for culture.  Coronavirus test is also sent to the laboratory.  Discussed with patient how to get her test results back, quarantine, use of antibiotic. Final Clinical Impressions(s) / UC Diagnoses   Final diagnoses:  Acute tonsillitis, unspecified etiology     Discharge Instructions     Drink plenty of fluids May take over-the-counter cough and cold medicine Your test results will come to my chart If your throat culture is negative, you may stop the antibiotic as soon as you feel well.  If your culture is positive for strep you must take 10 full days of antibiotics Return if worse at any time instead of better Call for questions   ED Prescriptions    Medication Sig Dispense Auth. Provider   amoxicillin (AMOXIL) 875 MG tablet Take 1 tablet (875 mg total) by mouth 2 (two) times daily for 10 days. 20 tablet Raylene Everts, MD     PDMP not reviewed this encounter.   Raylene Everts, MD 01/17/19 1032

## 2019-01-19 LAB — CULTURE, GROUP A STREP (THRC)

## 2019-01-20 LAB — NOVEL CORONAVIRUS, NAA (HOSP ORDER, SEND-OUT TO REF LAB; TAT 18-24 HRS): SARS-CoV-2, NAA: NOT DETECTED

## 2019-05-10 ENCOUNTER — Ambulatory Visit (HOSPITAL_COMMUNITY)
Admission: EM | Admit: 2019-05-10 | Discharge: 2019-05-10 | Disposition: A | Discharge status: Home / Self Care | Payer: No Typology Code available for payment source | Attending: Family Medicine | Admitting: Family Medicine

## 2019-05-10 ENCOUNTER — Other Ambulatory Visit: Payer: Self-pay

## 2019-05-10 ENCOUNTER — Encounter (HOSPITAL_COMMUNITY): Payer: Self-pay | Admitting: Emergency Medicine

## 2019-05-10 ENCOUNTER — Telehealth (HOSPITAL_COMMUNITY): Payer: Self-pay | Admitting: Emergency Medicine

## 2019-05-10 DIAGNOSIS — R002 Palpitations: Secondary | ICD-10-CM | POA: Diagnosis not present

## 2019-05-10 LAB — BASIC METABOLIC PANEL
Anion gap: 11 (ref 5–15)
BUN: 7 mg/dL (ref 6–20)
CO2: 30 mmol/L (ref 22–32)
Calcium: 9.2 mg/dL (ref 8.9–10.3)
Chloride: 100 mmol/L (ref 98–111)
Creatinine, Ser: 0.82 mg/dL (ref 0.44–1.00)
GFR calc Af Amer: >60 mL/min (ref >60)
GFR calc non Af Amer: >60 mL/min (ref >60)
Glucose, Bld: 135 mg/dL — ABNORMAL HIGH (ref 70–99)
Potassium: 3.2 mmol/L — ABNORMAL LOW (ref 3.5–5.1)
Sodium: 141 mmol/L (ref 135–145)

## 2019-05-10 LAB — CBC
HCT: 40.5 % (ref 36.0–46.0)
Hemoglobin: 13 g/dL (ref 12.0–15.0)
MCH: 28.4 pg (ref 26.0–34.0)
MCHC: 32.1 g/dL (ref 30.0–36.0)
MCV: 88.6 fL (ref 80.0–100.0)
Platelets: 351 10*3/uL (ref 150–400)
RBC: 4.57 MIL/uL (ref 3.87–5.11)
RDW: 12.7 % (ref 11.5–15.5)
WBC: 7.6 10*3/uL (ref 4.0–10.5)
nRBC: 0 % (ref 0.0–0.2)

## 2019-05-10 LAB — MAGNESIUM: Magnesium: 2 mg/dL (ref 1.7–2.4)

## 2019-05-10 LAB — TSH: TSH: 0.584 u[IU]/mL (ref 0.350–4.500)

## 2019-05-10 MED ORDER — POTASSIUM CHLORIDE CRYS ER 20 MEQ PO TBCR: 20.0000 meq | EXTENDED_RELEASE_TABLET | Freq: Three times a day (TID) | ORAL | 0 refills | Status: DC

## 2019-05-10 NOTE — Telephone Encounter (Signed)
Potassium 3.2, likely from HCTZ use. Sending potassium chloride 20 mg TID x 5 days, follow up for recheck.   Attempted to call x 1, no answer, no voicemail.

## 2019-05-10 NOTE — Discharge Instructions (Signed)
Please continue to limit caffiene, please hold off on any new supplements to see if symptoms resolve with removing this Blood work pending- I will call if abnormal  Follow up with PCP/Cardiology if continuing to be frequent  If developing pain, dizziness, lightheadedness, persistent shortness of breath with this, please follow up

## 2019-05-10 NOTE — ED Provider Notes (Signed)
[place]    CSN: [id number] Arrival date & time: 05/10/19  1404      History   Chief Complaint Chief Complaint  Patient presents with  . Palpitations    HPI Amanda Cox is a 44 y.o. female history of hypertension, presenting today for evaluation of palpitations.  Patient notes that she has history of feeling her heart race/skipping beat.  She has previously been worked up for this by wearing a Holter monitor and echo.  Symptoms subsided, but of recently over the past few days feels symptoms have becoming more frequent again.  She denies any associated chest pain.  Feels slightly short of breath for the brief episode of palpitations, but no persistent shortness of breath or difficulty breathing.  She denies any headaches or vision changes.  Denies any weakness or difficulty speaking.  Patient denies any caffeine use.  She does report using some vitamins as well as some supplements.  Reports she recently started taking silver supplements.  Also reports using a magnesium solution for bowel health.  On chart review patient was seen by cardiology in 08/2018; echo results were normal, Holter monitor showing PVCs correlating with her symptoms.  At that time recommended avoiding caffeine and daily exercise.  HPI  Past Medical History:  Diagnosis Date  . Chronic insomnia 11/08/2016  . Dyspareunia   . Essential hypertension, benign   . Genital herpes, unspecified   . Headache syndrome 11/08/2016  . Irregular menstrual cycle   . Surveillance of previously prescribed contraceptive pill     Patient Active Problem List   Diagnosis Date Noted  . Routine general medical examination at a health care facility 08/02/2018  . Constipation 04/11/2018  . Chronic insomnia 11/08/2016  . Snoring 08/19/2013  . Obesity, unspecified 08/19/2013  . Essential hypertension, benign 07/10/2013  . Genital herpes 07/17/2012    Past Surgical History:  Procedure Laterality Date  .  BREAST REDUCTION SURGERY    . CESAREAN SECTION      OB History    Gravida  2   Para  1   Term  1   Preterm      AB  1   Living  1     SAB      TAB  1   Ectopic      Multiple      Live Births  1            Home Medications    Prior to Admission medications   Medication Sig Start Date End Date Taking? Authorizing Provider  hydrochlorothiazide (HYDRODIURIL) 25 MG tablet Take 1 tablet (25 mg total) by mouth daily. 07/22/18   [name], MD  ibuprofen (ADVIL) 800 MG tablet Take 1 tablet (800 mg total) by mouth every 8 (eight) hours as needed. 07/22/18   [name], MD  Norgestimate-Ethinyl Estradiol Triphasic 0.18/0.215/0.25 MG-25 MCG tab Take 1 tablet by mouth daily. 07/22/18   [name], MD  fluticasone (FLONASE) 50 MCG/ACT nasal spray Place 2 sprays into both nostrils as needed for allergies.  11/02/17 10/25/18  [provider]  VALTREX 500 MG tablet TAKE 1 TABLET (500 MG TOTAL) BY MOUTH DAILY. 07/22/18 10/25/18  [name], MD    Family History Family History  Problem Relation Age of Onset  . Diabetes Mother   . Hyperlipidemia Mother   . Arthritis Mother   . Diabetes Father   . Diabetes Sister   . Arthritis Paternal Grandmother   .  Hypertension Paternal Grandmother   . Congenital heart disease Other   . Colon cancer Neg Hx   . Rectal cancer Neg Hx   . Esophageal cancer Neg Hx   . Stomach cancer Neg Hx   . Liver cancer Neg Hx     Social History Social History   Tobacco Use  . Smoking status: Never Smoker  . Smokeless tobacco: Never Used  Substance Use Topics  . Alcohol use: Yes    Alcohol/week: 0.0 standard drinks    Comment: rare- once every 3 months  . Drug use: No     Allergies   Sulfa antibiotics   Review of Systems Review of Systems  Constitutional: Negative for fatigue and fever.  HENT: Negative for congestion, sinus pressure and sore throat.   Eyes: Negative for photophobia, pain and visual disturbance.   Respiratory: Negative for cough and shortness of breath.   Cardiovascular: Positive for palpitations. Negative for chest pain and leg swelling.  Gastrointestinal: Negative for abdominal pain, nausea and vomiting.  Genitourinary: Negative for decreased urine volume and hematuria.  Musculoskeletal: Negative for myalgias, neck pain and neck stiffness.  Neurological: Negative for dizziness, syncope, facial asymmetry, speech difficulty, weakness, light-headedness, numbness and headaches.     Physical Exam Triage Vital Signs ED Triage Vitals  Enc Vitals Group     BP 05/10/19 1435 139/81     Pulse Rate 05/10/19 1435 76     Resp 05/10/19 1435 18     Temp 05/10/19 1435 98.5 F (36.9 C)     Temp Source 05/10/19 1435 Oral     SpO2 05/10/19 1435 100 %     Weight --      Height --      Head Circumference --      Peak Flow --      Pain Score 05/10/19 1436 0     Pain Loc --      Pain Edu? --      Excl. in [place]? --    No data found.  Updated Vital Signs BP 139/81 (BP Location: Right Arm)   Pulse 76   Temp 98.5 F (36.9 C) (Oral)   Resp 18   SpO2 100%   Visual Acuity Right Eye Distance:   Left Eye Distance:   Bilateral Distance:    Right Eye Near:   Left Eye Near:    Bilateral Near:     Physical Exam Vitals and nursing note reviewed.  Constitutional:      Appearance: She is well-developed.     Comments: No acute distress  HENT:     Head: Normocephalic and atraumatic.     Nose: Nose normal.  Eyes:     Extraocular Movements: Extraocular movements intact.     Conjunctiva/sclera: Conjunctivae normal.     Pupils: Pupils are equal, round, and reactive to light.  Cardiovascular:     Rate and Rhythm: Regular rhythm. Tachycardia present.     Comments: No carotid bruits auscultated Heart rate rechecked and was 104, wearing long acrylic nails Pulmonary:     Effort: Pulmonary effort is normal. No respiratory distress.     Comments: Breathing comfortably at rest, CTABL, no  wheezing, rales or other adventitious sounds auscultated Abdominal:     General: There is no distension.  Musculoskeletal:        General: Normal range of motion.     Cervical back: Neck supple.  Skin:    General: Skin is warm and dry.  Neurological:  General: No focal deficit present.     Mental Status: She is alert and oriented to person, place, and time. Mental status is at baseline.     Cranial Nerves: No cranial nerve deficit.      UC Treatments / Results  Labs (all labs ordered are listed, but only abnormal results are displayed) Labs Reviewed  CBC  BASIC METABOLIC PANEL  TSH  MAGNESIUM    EKG   Radiology No results found.  Procedures Procedures (including critical care time)  Medications Ordered in UC Medications - No data to display  Initial Impression / Assessment and Plan / UC Course  I have reviewed the triage vital signs and the nursing notes.  Pertinent labs & imaging results that were available during my care of the patient were reviewed by me and considered in my medical decision making (see chart for details).    EKG sinus tachycardia, no acute signs of ischemia or infarction, no abnormal beats or arrhythmia noted. Patient had 1 brief episode of palpitations during visit, but this is not palpated or captured on EKG.  Suspect likely recurrence of PVCs that were found on prior Holter monitor reading.  Did advise to stop silver supplementation as this is the main new supplement.  Monitor to see if symptoms decrease in frequency with stopping this.  We will check basic labs of electrolytes, CBC, magnesium and TSH.  Will call with results and provide further treatment as needed.  Recommended if continuing to be extremely frequent to follow-up with PCP, possible cardiology reevaluation to ensure no new changes.  Follow-up emergency room developing any chest pain shortness of breath, dizziness or lightheadedness associated with this.  Discussed strict  return precautions. Patient verbalized understanding and is agreeable with plan.  Final Clinical Impressions(s) / UC Diagnoses   Final diagnoses:  Palpitations     Discharge Instructions     Please continue to limit caffiene, please hold off on any new supplements to see if symptoms resolve with removing this Blood work pending- I will call if abnormal  Follow up with PCP/Cardiology if continuing to be frequent  If developing pain, dizziness, lightheadedness, persistent shortness of breath with this, please follow up   ED Prescriptions    None     PDMP not reviewed this encounter.   Janith Lima, [place] 05/11/19 [phone]

## 2019-05-10 NOTE — ED Triage Notes (Signed)
Pt sts palpitations x years worse over last several days; pt sts hx of negative cardiac workup for same

## 2019-05-11 ENCOUNTER — Telehealth (HOSPITAL_COMMUNITY): Payer: Self-pay | Admitting: Emergency Medicine

## 2019-05-11 MED ORDER — POTASSIUM CHLORIDE CRYS ER 20 MEQ PO TBCR: 20.0000 meq | EXTENDED_RELEASE_TABLET | Freq: Three times a day (TID) | ORAL | 0 refills | Status: DC

## 2019-05-11 NOTE — Telephone Encounter (Signed)
Called patient and informed of Hypokalemia, Potassium chloride sent to pharmacy. Advised recheck in 1 week.

## 2019-05-17 ENCOUNTER — Ambulatory Visit (HOSPITAL_COMMUNITY)
Admission: EM | Admit: 2019-05-17 | Discharge: 2019-05-17 | Disposition: A | Discharge status: Home / Self Care | Payer: No Typology Code available for payment source | Attending: Physician Assistant | Admitting: Physician Assistant

## 2019-05-17 ENCOUNTER — Encounter (HOSPITAL_COMMUNITY): Payer: Self-pay

## 2019-05-17 ENCOUNTER — Other Ambulatory Visit: Payer: Self-pay

## 2019-05-17 DIAGNOSIS — M7989 Other specified soft tissue disorders: Secondary | ICD-10-CM | POA: Insufficient documentation

## 2019-05-17 DIAGNOSIS — E876 Hypokalemia: Secondary | ICD-10-CM | POA: Insufficient documentation

## 2019-05-17 LAB — BASIC METABOLIC PANEL
Anion gap: 7 (ref 5–15)
BUN: 8 mg/dL (ref 6–20)
CO2: 25 mmol/L (ref 22–32)
Calcium: 8.7 mg/dL — ABNORMAL LOW (ref 8.9–10.3)
Chloride: 104 mmol/L (ref 98–111)
Creatinine, Ser: 0.75 mg/dL (ref 0.44–1.00)
GFR calc Af Amer: >60 mL/min (ref >60)
GFR calc non Af Amer: >60 mL/min (ref >60)
Glucose, Bld: 122 mg/dL — ABNORMAL HIGH (ref 70–99)
Potassium: 4.1 mmol/L (ref 3.5–5.1)
Sodium: 136 mmol/L (ref 135–145)

## 2019-05-17 NOTE — Discharge Instructions (Signed)
We we have sent your lab work and will notify you of his results.  I believe your lower extremity swelling is resolving and will resolve with continued elevation your feet and do not believe that you need a fluid pill at this time.  I like for you to have sooner follow-up with your primary care and like for you to call the office to be seen in 1 month for lab recheck.  If you are unable to do this like for you to return for recheck in 3 to 4 weeks.  If you continue to have palpitations or have chest pain or shortness of breath please return or go to the emergency department.

## 2019-05-17 NOTE — ED Triage Notes (Signed)
Pt is here for a follow up on her potassium . Pt states she has been taking the meds for 5 days.

## 2019-05-17 NOTE — ED Provider Notes (Signed)
[facility]    CSN: [id number] Arrival date & time: 05/17/19  1003      History   Chief Complaint Chief Complaint  Patient presents with  . f/u Potassium    HPI Amanda Cox is a 44 y.o. female.   Patient presents urgent care for follow-up recent visit for potassium recheck.  She was recently seen on 05/10/2019 in this urgent care for palpitations.  EKG was without pathology at that time.  She had BMP drawn and was found to be hypokalemic at 3.2.  She was placed on potassium supplements and instructed to follow-up after completion of potassium for recheck.  Today she reports she has not had palpitations, chest pain or shortness of breath.  She does endorse some lower extremity swelling in both legs.  She reports this started after taking potassium supplements about 2 days then.  She reports at its peak swelling came up to her mid shin.  She does report that this is mostly resolved however she notes her feet are still swollen slightly.  She is wondering if she should have a fluid pill as she has had this happen before.  She reports that she has a primary care appointment in September for establishing care with a new provider.     Past Medical History:  Diagnosis Date  . Chronic insomnia 11/08/2016  . Dyspareunia   . Essential hypertension, benign   . Genital herpes, unspecified   . Headache syndrome 11/08/2016  . Irregular menstrual cycle   . Surveillance of previously prescribed contraceptive pill     Patient Active Problem List   Diagnosis Date Noted  . Routine general medical examination at a health care facility 08/02/2018  . Constipation 04/11/2018  . Chronic insomnia 11/08/2016  . Snoring 08/19/2013  . Obesity, unspecified 08/19/2013  . Essential hypertension, benign 07/10/2013  . Genital herpes 07/17/2012    Past Surgical History:  Procedure Laterality Date  . BREAST REDUCTION SURGERY    . CESAREAN SECTION      OB History    Gravida  2     Para  1   Term  1   Preterm      AB  1   Living  1     SAB      TAB  1   Ectopic      Multiple      Live Births  1            Home Medications    Prior to Admission medications   Medication Sig Start Date End Date Taking? Authorizing Provider  hydrochlorothiazide (HYDRODIURIL) 25 MG tablet Take 1 tablet (25 mg total) by mouth daily. 07/22/18   [name], MD  ibuprofen (ADVIL) 800 MG tablet Take 1 tablet (800 mg total) by mouth every 8 (eight) hours as needed. 07/22/18   [name], MD  Norgestimate-Ethinyl Estradiol Triphasic 0.18/0.215/0.25 MG-25 MCG tab Take 1 tablet by mouth daily. 07/22/18   [name], MD  potassium chloride SA (KLOR-CON) 20 MEQ tablet Take 1 tablet (20 mEq total) by mouth 3 (three) times daily for 5 days. 05/11/19 05/16/19  Wieters, Hallie C, PA-C  fluticasone (FLONASE) 50 MCG/ACT nasal spray Place 2 sprays into both nostrils as needed for allergies.  11/02/17 10/25/18  [provider]  VALTREX 500 MG tablet TAKE 1 TABLET (500 MG TOTAL) BY MOUTH DAILY. 07/22/18 10/25/18  [name], MD    Family History Family History  Problem  Relation Age of Onset  . Diabetes Mother   . Hyperlipidemia Mother   . Arthritis Mother   . Diabetes Father   . Diabetes Sister   . Arthritis Paternal Grandmother   . Hypertension Paternal Grandmother   . Congenital heart disease Other   . Colon cancer Neg Hx   . Rectal cancer Neg Hx   . Esophageal cancer Neg Hx   . Stomach cancer Neg Hx   . Liver cancer Neg Hx     Social History Social History   Tobacco Use  . Smoking status: Never Smoker  . Smokeless tobacco: Never Used  Substance Use Topics  . Alcohol use: Yes    Alcohol/week: 0.0 standard drinks    Comment: rare- once every 3 months  . Drug use: No     Allergies   Sulfa antibiotics   Review of Systems Review of Systems  Constitutional: Negative for chills and fever.  Eyes: Negative for pain and visual disturbance.   Respiratory: Negative for cough and shortness of breath.   Cardiovascular: Positive for leg swelling. Negative for chest pain and palpitations.  Gastrointestinal: Negative for abdominal pain, diarrhea, nausea and vomiting.  Genitourinary: Negative for dysuria and hematuria.  Musculoskeletal: Negative for arthralgias and back pain.  Skin: Negative for color change and rash.  Neurological: Negative for seizures, syncope, light-headedness and headaches.  All other systems reviewed and are negative.    Physical Exam Triage Vital Signs ED Triage Vitals [05/17/19 1016]  Enc Vitals Group     BP      Pulse      Resp      Temp      Temp src      SpO2      Weight 212 lb (96.2 kg)     Height      Head Circumference      Peak Flow      Pain Score 0     Pain Loc      Pain Edu?      Excl. in [place]?    No data found.  Updated Vital Signs BP 133/89 (BP Location: Right Arm)   Pulse 82   Temp 98.3 F (36.8 C) (Oral)   Resp 18   Wt 212 lb (96.2 kg)   LMP 05/03/2019   SpO2 100%   BMI 37.55 kg/m   Visual Acuity Right Eye Distance:   Left Eye Distance:   Bilateral Distance:    Right Eye Near:   Left Eye Near:    Bilateral Near:     Physical Exam Vitals and nursing note reviewed.  Constitutional:      General: She is not in acute distress.    Appearance: She is well-developed.  HENT:     Head: Normocephalic and atraumatic.  Eyes:     Conjunctiva/sclera: Conjunctivae normal.  Cardiovascular:     Rate and Rhythm: Normal rate and regular rhythm.     Heart sounds: No murmur.  Pulmonary:     Effort: Pulmonary effort is normal. No respiratory distress.     Breath sounds: Normal breath sounds.  Abdominal:     Palpations: Abdomen is soft.     Tenderness: There is no abdominal tenderness.  Musculoskeletal:     Cervical back: Neck supple.     Comments: There is nonpitting edema in bilateral feet.  Skin:    General: Skin is warm and dry.  Neurological:     Mental Status: She  is alert.  UC Treatments / Results  Labs (all labs ordered are listed, but only abnormal results are displayed) Labs Reviewed  BASIC METABOLIC PANEL    EKG   Radiology No results found.  Procedures Procedures (including critical care time)  Medications Ordered in UC Medications - No data to display  Initial Impression / Assessment and Plan / UC Course  I have reviewed the triage vital signs and the nursing notes.  Pertinent labs & imaging results that were available during my care of the patient were reviewed by me and considered in my medical decision making (see chart for details).     #Leg swelling #Hypokalemia Patient is a 44 year old presenting with lower extremity edema and recheck of potassium.  She is well today without cardiac symptoms.  BMP drawn and will notify patient of these results.  Believe that lower extremity swelling is resolving and that there is no indication for diuretic treatment at this time.  Instructed patient to elevate her feet as much as possible.  Strict patient to have sooner follow-up with primary care and likely lab recheck in 4 weeks.  We will discuss further when BMP returns with regard to management of hypokalemia. -ED and return precautions were discussed patient verbalized understanding Final Clinical Impressions(s) / UC Diagnoses   Final diagnoses:  Leg swelling  Hypokalemia     Discharge Instructions     We we have sent your lab work and will notify you of his results.  I believe your lower extremity swelling is resolving and will resolve with continued elevation your feet and do not believe that you need a fluid pill at this time.  I like for you to have sooner follow-up with your primary care and like for you to call the office to be seen in 1 month for lab recheck.  If you are unable to do this like for you to return for recheck in 3 to 4 weeks.  If you continue to have palpitations or have chest pain or shortness of  breath please return or go to the emergency department.      ED Prescriptions    None     PDMP not reviewed this encounter.   Purnell Shoemaker, PA-C 05/17/19 1049

## 2019-05-19 ENCOUNTER — Telehealth (HOSPITAL_COMMUNITY): Payer: Self-pay | Admitting: Physician Assistant

## 2019-05-19 NOTE — Telephone Encounter (Signed)
Patient called to discuss Potassium levels. K at 4.2 following K supplementation. Discuss f/u in 4 weeks for recheck with PCP or [facility] If unable to be seen by PCP. Discussed return if having symptoms again or following up with Cardiology. Patient verbalizes understanding.

## 2019-05-23 ENCOUNTER — Other Ambulatory Visit: Payer: Self-pay | Admitting: Physician Assistant

## 2019-05-23 DIAGNOSIS — Z1231 Encounter for screening mammogram for malignant neoplasm of breast: Secondary | ICD-10-CM

## 2019-06-22 ENCOUNTER — Other Ambulatory Visit: Payer: Self-pay | Admitting: Obstetrics

## 2019-06-22 DIAGNOSIS — Z3041 Encounter for surveillance of contraceptive pills: Secondary | ICD-10-CM

## 2019-07-04 ENCOUNTER — Other Ambulatory Visit: Payer: Self-pay

## 2019-07-04 ENCOUNTER — Other Ambulatory Visit (HOSPITAL_COMMUNITY)
Admission: RE | Admit: 2019-07-04 | Discharge: 2019-07-04 | Disposition: A | Discharge status: Home / Self Care | Payer: No Typology Code available for payment source | Source: Ambulatory Visit | Attending: Obstetrics | Admitting: Obstetrics

## 2019-07-04 ENCOUNTER — Encounter: Payer: Self-pay | Admitting: Obstetrics

## 2019-07-04 ENCOUNTER — Ambulatory Visit (INDEPENDENT_AMBULATORY_CARE_PROVIDER_SITE_OTHER): Payer: No Typology Code available for payment source | Admitting: Obstetrics

## 2019-07-04 VITALS — BP 128/89 | HR 76 | Ht 63.0 in | Wt 205.0 lb

## 2019-07-04 DIAGNOSIS — B9689 Other specified bacterial agents as the cause of diseases classified elsewhere: Secondary | ICD-10-CM

## 2019-07-04 DIAGNOSIS — N760 Acute vaginitis: Secondary | ICD-10-CM | POA: Diagnosis not present

## 2019-07-04 DIAGNOSIS — Z113 Encounter for screening for infections with a predominantly sexual mode of transmission: Secondary | ICD-10-CM | POA: Insufficient documentation

## 2019-07-04 DIAGNOSIS — N898 Other specified noninflammatory disorders of vagina: Secondary | ICD-10-CM | POA: Diagnosis not present

## 2019-07-04 DIAGNOSIS — Z01411 Encounter for gynecological examination (general) (routine) with abnormal findings: Secondary | ICD-10-CM

## 2019-07-04 DIAGNOSIS — A6000 Herpesviral infection of urogenital system, unspecified: Secondary | ICD-10-CM

## 2019-07-04 DIAGNOSIS — Z124 Encounter for screening for malignant neoplasm of cervix: Secondary | ICD-10-CM | POA: Insufficient documentation

## 2019-07-04 DIAGNOSIS — Z3041 Encounter for surveillance of contraceptive pills: Secondary | ICD-10-CM

## 2019-07-04 DIAGNOSIS — B373 Candidiasis of vulva and vagina: Secondary | ICD-10-CM

## 2019-07-04 DIAGNOSIS — Z01419 Encounter for gynecological examination (general) (routine) without abnormal findings: Secondary | ICD-10-CM

## 2019-07-04 DIAGNOSIS — B3731 Acute candidiasis of vulva and vagina: Secondary | ICD-10-CM

## 2019-07-04 DIAGNOSIS — N946 Dysmenorrhea, unspecified: Secondary | ICD-10-CM

## 2019-07-04 MED ORDER — FLUCONAZOLE 150 MG PO TABS: 150.0000 mg | ORAL_TABLET | Freq: Once | ORAL | 4 refills | Status: DC

## 2019-07-04 MED ORDER — METRONIDAZOLE 0.75 % VA GEL: 1.0000 | Freq: Two times a day (BID) | VAGINAL | 5 refills | Status: DC

## 2019-07-04 MED ORDER — IBUPROFEN 800 MG PO TABS: 800.0000 mg | ORAL_TABLET | Freq: Three times a day (TID) | ORAL | 5 refills | Status: DC | PRN

## 2019-07-04 MED ORDER — FLUCONAZOLE 150 MG PO TABS: 150.0000 mg | ORAL_TABLET | Freq: Once | ORAL | 4 refills | Status: AC

## 2019-07-04 MED ORDER — VALACYCLOVIR HCL 500 MG PO TABS: 500.0000 mg | ORAL_TABLET | Freq: Two times a day (BID) | ORAL | 5 refills | Status: DC

## 2019-07-04 NOTE — Progress Notes (Signed)
Patient presents for Annual Exam.   Last Pap: 07/27/2017 WNL  LMP:06/20/2019 Mammogram: 09/13/2018 Family Hx of Breast Cancer: No  STD Screening: Desires Full Panel  Contraception: BCP's Needs refill   CC: None

## 2019-07-04 NOTE — Progress Notes (Signed)
Subjective:        Amanda Cox is a 44 y.o. female here for a routine exam.  Current complaints: None.    Personal health questionnaire:  Is patient Amanda Cox, have a family history of breast and/or ovarian cancer: no Is there a family history of uterine cancer diagnosed at age < 57, gastrointestinal cancer, urinary tract cancer, family member who is a Field seismologist syndrome-associated carrier: no Is the patient overweight and hypertensive, family history of diabetes, personal history of gestational diabetes, preeclampsia or PCOS: yes Is patient over 43, have PCOS,  family history of premature CHD under age 6, diabetes, smoke, have hypertension or peripheral artery disease:  no At any time, has a partner hit, kicked or otherwise hurt or frightened you?: no Over the past 2 weeks, have you felt down, depressed or hopeless?: no Over the past 2 weeks, have you felt little interest or pleasure in doing things?:no   Gynecologic History Patient's last menstrual period was 06/20/2019 (exact date). Contraception: OCP (estrogen/progesterone) Last Pap: 07-27-2017. Results were: normal Last mammogram: 2020. Results were: normal  Obstetric History OB History  Gravida Para Term Preterm AB Living  2 1 1   1 1   SAB TAB Ectopic Multiple Live Births    1     1    # Outcome Date GA Lbr Len/2nd Weight Sex Delivery Anes PTL Lv  2 Term 07/22/01 [redacted]w[redacted]d  7 lb (3.175 kg) F CS-LTranv EPI N LIV  1 TAB             Past Medical History:  Diagnosis Date  . Chronic insomnia 11/08/2016  . Dyspareunia   . Essential hypertension, benign   . Genital herpes, unspecified   . Headache syndrome 11/08/2016  . Irregular menstrual cycle   . Surveillance of previously prescribed contraceptive pill     Past Surgical History:  Procedure Laterality Date  . BREAST REDUCTION SURGERY    . CESAREAN SECTION       Current Outpatient Medications:  .  hydrochlorothiazide (HYDRODIURIL) 25 MG tablet, Take 1  tablet (25 mg total) by mouth daily., Disp: 30 tablet, Rfl: 11 .  ibuprofen (ADVIL) 800 MG tablet, Take 1 tablet (800 mg total) by mouth every 8 (eight) hours as needed., Disp: 30 tablet, Rfl: 5 .  TRI-LO-SPRINTEC 0.18/0.215/0.25 MG-25 MCG tab, TAKE 1 TABLET BY MOUTH EVERY DAY, Disp: 28 tablet, Rfl: 11 .  fluconazole (DIFLUCAN) 150 MG tablet, Take 1 tablet (150 mg total) by mouth once for 1 dose., Disp: 1 tablet, Rfl: 4 .  metroNIDAZOLE (METROGEL VAGINAL) 0.75 % vaginal gel, Place 1 Applicatorful vaginally 2 (two) times daily., Disp: 70 g, Rfl: 5 .  potassium chloride SA (KLOR-CON) 20 MEQ tablet, Take 1 tablet (20 mEq total) by mouth 3 (three) times daily for 5 days., Disp: 15 tablet, Rfl: 0 .  valACYclovir (VALTREX) 500 MG tablet, Take 1 tablet (500 mg total) by mouth 2 (two) times daily. Take for 3 days prn each occurrence., Disp: 30 tablet, Rfl: 5 Allergies  Allergen Reactions  . Sulfa Antibiotics Shortness Of Breath    Social History   Tobacco Use  . Smoking status: Never Smoker  . Smokeless tobacco: Never Used  Substance Use Topics  . Alcohol use: Yes    Alcohol/week: 0.0 standard drinks    Comment: rare- once every 3 months    Family History  Problem Relation Age of Onset  . Diabetes Mother   . Hyperlipidemia Mother   .  Arthritis Mother   . Diabetes Father   . Diabetes Sister   . Arthritis Paternal Grandmother   . Hypertension Paternal Grandmother   . Congenital heart disease Other   . Colon cancer Neg Hx   . Rectal cancer Neg Hx   . Esophageal cancer Neg Hx   . Stomach cancer Neg Hx   . Liver cancer Neg Hx       Review of Systems  Constitutional: negative for fatigue and weight loss Respiratory: negative for cough and wheezing Cardiovascular: negative for chest pain, fatigue and palpitations Gastrointestinal: negative for abdominal pain and change in bowel habits Musculoskeletal:negative for myalgias Neurological: negative for gait problems and  tremors Behavioral/Psych: negative for abusive relationship, depression Endocrine: negative for temperature intolerance    Genitourinary:negative for abnormal menstrual periods, genital lesions, hot flashes, sexual problems and vaginal discharge Integument/breast: negative for breast lump, breast tenderness, nipple discharge and skin lesion(s)    Objective:       BP 128/89   Pulse 76   Ht 5\' 3"  (1.6 m)   Wt 205 lb (93 kg)   LMP 06/20/2019 (Exact Date)   BMI 36.31 kg/m  General:   alert  Skin:   no rash or abnormalities  Lungs:   clear to auscultation bilaterally  Heart:   regular rate and rhythm, S1, S2 normal, no murmur, click, rub or gallop  Breasts:   normal without suspicious masses, skin or nipple changes or axillary nodes  Abdomen:  normal findings: no organomegaly, soft, non-tender and no hernia  Pelvis:  External genitalia: normal general appearance Urinary system: urethral meatus normal and bladder without fullness, nontender Vaginal: normal without tenderness, induration or masses Cervix: normal appearance Adnexa: normal bimanual exam Uterus: anteverted and non-tender, normal size   Lab Review Urine pregnancy test Labs reviewed yes Radiologic studies reviewed yes  50% of 20 min visit spent on counseling and coordination of care.   Assessment:     1. Encounter for routine gynecological examination with Papanicolaou smear of cervix Rx: - Cytology - PAP( Amanda Cox)  2. Vaginal discharge Rx: - Cervicovaginal ancillary only( Amanda Cox)  3. Screening for STD (sexually transmitted disease) Rx: - Hepatitis B surface antigen - Hepatitis C antibody - HIV Antibody (routine testing w rflx) - RPR  4. BV (bacterial vaginosis) Rx: - metroNIDAZOLE (METROGEL VAGINAL) 0.75 % vaginal gel; Place 1 Applicatorful vaginally 2 (two) times daily.  Dispense: 70 g; Refill: 5  5. Candida vaginitis Rx: - fluconazole (DIFLUCAN) 150 MG tablet; Take 1 tablet (150 mg total)  by mouth once for 1 dose.  Dispense: 1 tablet; Refill: 4  6. Genital herpes simplex, unspecified site Rx: - valACYclovir (VALTREX) 500 MG tablet; Take 1 tablet (500 mg total) by mouth 2 (two) times daily. Take for 3 days prn each occurrence.  Dispense: 30 tablet; Refill: 5  7. Encounter for surveillance of contraceptive pills - doing well    Plan:    Education reviewed: calcium supplements, depression evaluation, low fat, low cholesterol diet, safe sex/STD prevention, self breast exams and weight bearing exercise. Contraception: OCP (estrogen/progesterone). Follow up in: 1 year.     Meds ordered this encounter  Medications  . metroNIDAZOLE (METROGEL VAGINAL) 0.75 % vaginal gel    Sig: Place 1 Applicatorful vaginally 2 (two) times daily.    Dispense:  70 g    Refill:  5  . fluconazole (DIFLUCAN) 150 MG tablet    Sig: Take 1 tablet (150 mg total) by mouth once  for 1 dose.    Dispense:  1 tablet    Refill:  4  . valACYclovir (VALTREX) 500 MG tablet    Sig: Take 1 tablet (500 mg total) by mouth 2 (two) times daily. Take for 3 days prn each occurrence.    Dispense:  30 tablet    Refill:  5   Orders Placed This Encounter  Procedures  . Hepatitis B surface antigen  . Hepatitis C antibody  . HIV Antibody (routine testing w rflx)  . RPR    [name], MD 07/04/2019 10:34 AM

## 2019-07-04 NOTE — Addendum Note (Signed)
Addended by: Baltazar Najjar A on: 07/04/2019 11:44 AM   Modules accepted: Orders

## 2019-07-05 LAB — RPR: RPR Ser Ql: NONREACTIVE

## 2019-07-05 LAB — HEPATITIS B SURFACE ANTIGEN: Hepatitis B Surface Ag: NEGATIVE

## 2019-07-05 LAB — HEPATITIS C ANTIBODY: Hep C Virus Ab: <0.1 s/co ratio (ref 0.0–0.9)

## 2019-07-05 LAB — HIV ANTIBODY (ROUTINE TESTING W REFLEX): HIV Screen 4th Generation wRfx: NONREACTIVE

## 2019-07-07 LAB — CERVICOVAGINAL ANCILLARY ONLY
Bacterial Vaginitis (gardnerella): NEGATIVE
Candida Glabrata: NEGATIVE
Candida Vaginitis: NEGATIVE
Chlamydia: NEGATIVE
Comment: NEGATIVE
Comment: NEGATIVE
Comment: NEGATIVE
Comment: NEGATIVE
Comment: NEGATIVE
Comment: NORMAL
Neisseria Gonorrhea: NEGATIVE
Trichomonas: NEGATIVE

## 2019-07-08 LAB — CYTOLOGY - PAP
Adequacy: ABSENT
Comment: NEGATIVE
Diagnosis: NEGATIVE
High risk HPV: NEGATIVE

## 2019-08-08 ENCOUNTER — Encounter: Payer: No Typology Code available for payment source | Admitting: Internal Medicine

## 2019-09-15 ENCOUNTER — Ambulatory Visit: Payer: No Typology Code available for payment source

## 2019-10-26 ENCOUNTER — Other Ambulatory Visit: Payer: Self-pay

## 2019-10-26 ENCOUNTER — Encounter (HOSPITAL_COMMUNITY): Payer: Self-pay | Admitting: Gynecology

## 2019-10-26 ENCOUNTER — Ambulatory Visit (HOSPITAL_COMMUNITY)
Admission: EM | Admit: 2019-10-26 | Discharge: 2019-10-26 | Disposition: A | Discharge status: Home / Self Care | Payer: No Typology Code available for payment source | Attending: Family Medicine | Admitting: Family Medicine

## 2019-10-26 DIAGNOSIS — R439 Unspecified disturbances of smell and taste: Secondary | ICD-10-CM | POA: Diagnosis not present

## 2019-10-26 DIAGNOSIS — E669 Obesity, unspecified: Secondary | ICD-10-CM | POA: Insufficient documentation

## 2019-10-26 DIAGNOSIS — I10 Essential (primary) hypertension: Secondary | ICD-10-CM | POA: Insufficient documentation

## 2019-10-26 DIAGNOSIS — Z6838 Body mass index (BMI) 38.0-38.9, adult: Secondary | ICD-10-CM | POA: Insufficient documentation

## 2019-10-26 DIAGNOSIS — Z882 Allergy status to sulfonamides status: Secondary | ICD-10-CM | POA: Insufficient documentation

## 2019-10-26 DIAGNOSIS — Z79899 Other long term (current) drug therapy: Secondary | ICD-10-CM | POA: Insufficient documentation

## 2019-10-26 DIAGNOSIS — Z20822 Contact with and (suspected) exposure to covid-19: Secondary | ICD-10-CM | POA: Insufficient documentation

## 2019-10-26 DIAGNOSIS — Z8249 Family history of ischemic heart disease and other diseases of the circulatory system: Secondary | ICD-10-CM | POA: Diagnosis not present

## 2019-10-26 LAB — SARS CORONAVIRUS 2 (TAT 6-24 HRS): SARS Coronavirus 2: NEGATIVE

## 2019-10-26 NOTE — ED Provider Notes (Signed)
[place]    CSN: [id number] Arrival date & time: 10/26/19  1009      History   Chief Complaint Chief Complaint  Patient presents with  . covid loss of taste and smell    HPI Amanda Cox is a 44 y.o. female.   Patient is a 44 year old female who presents today with continued loss of taste and smell.  This started approximate 2 weeks ago.  Started sore throat, headache which have resolved.  The loss of taste and smell is coming and going.  Currently feeling fine.  Daughter was diagnosed with Covid beginning of August.     Past Medical History:  Diagnosis Date  . Chronic insomnia 11/08/2016  . Dyspareunia   . Essential hypertension, benign   . Genital herpes, unspecified   . Headache syndrome 11/08/2016  . Irregular menstrual cycle   . Surveillance of previously prescribed contraceptive pill     Patient Active Problem List   Diagnosis Date Noted  . Routine general medical examination at a health care facility 08/02/2018  . Constipation 04/11/2018  . Chronic insomnia 11/08/2016  . Snoring 08/19/2013  . Obesity, unspecified 08/19/2013  . Essential hypertension, benign 07/10/2013  . Genital herpes 07/17/2012    Past Surgical History:  Procedure Laterality Date  . BREAST REDUCTION SURGERY    . CESAREAN SECTION      OB History    Gravida  2   Para  1   Term  1   Preterm      AB  1   Living  1     SAB      TAB  1   Ectopic      Multiple      Live Births  1            Home Medications    Prior to Admission medications   Medication Sig Start Date End Date Taking? Authorizing Provider  hydrochlorothiazide (HYDRODIURIL) 25 MG tablet Take 1 tablet (25 mg total) by mouth daily. 07/22/18  Yes [name], MD  ibuprofen (ADVIL) 800 MG tablet Take 1 tablet (800 mg total) by mouth every 8 (eight) hours as needed. 07/04/19  Yes [name], MD  metroNIDAZOLE (METROGEL VAGINAL) 0.75 % vaginal gel Place 1 Applicatorful  vaginally 2 (two) times daily. 07/04/19  Yes [name], MD  TRI-LO-SPRINTEC 0.18/0.215/0.25 MG-25 MCG tab TAKE 1 TABLET BY MOUTH EVERY DAY 06/23/19  Yes [name], MD  valACYclovir (VALTREX) 500 MG tablet Take 1 tablet (500 mg total) by mouth 2 (two) times daily. Take for 3 days prn each occurrence. 07/04/19  Yes [name], MD  potassium chloride SA (KLOR-CON) 20 MEQ tablet Take 1 tablet (20 mEq total) by mouth 3 (three) times daily for 5 days. 05/11/19 05/16/19  Wieters, Hallie C, PA-C  fluticasone (FLONASE) 50 MCG/ACT nasal spray Place 2 sprays into both nostrils as needed for allergies.  11/02/17 10/25/18  [provider]    Family History Family History  Problem Relation Age of Onset  . Diabetes Mother   . Hyperlipidemia Mother   . Arthritis Mother   . Diabetes Father   . Diabetes Sister   . Arthritis Paternal Grandmother   . Hypertension Paternal Grandmother   . Congenital heart disease Other   . Colon cancer Neg Hx   . Rectal cancer Neg Hx   . Esophageal cancer Neg Hx   . Stomach cancer Neg Hx   . Liver  cancer Neg Hx     Social History Social History   Tobacco Use  . Smoking status: Never Smoker  . Smokeless tobacco: Never Used  Vaping Use  . Vaping Use: Never used  Substance Use Topics  . Alcohol use: Yes    Alcohol/week: 0.0 standard drinks    Comment: rare- once every 3 months  . Drug use: No     Allergies   Sulfa antibiotics   Review of Systems Review of Systems   Physical Exam Triage Vital Signs ED Triage Vitals  Enc Vitals Group     BP 10/26/19 1035 108/82     Pulse Rate 10/26/19 1035 89     Resp 10/26/19 1035 16     Temp 10/26/19 1035 98.6 F (37 C)     Temp Source 10/26/19 1035 Oral     SpO2 10/26/19 1035 100 %     Weight 10/26/19 1034 215 lb (97.5 kg)     Height 10/26/19 1034 5\' 3"  (1.6 m)     Head Circumference --      Peak Flow --      Pain Score 10/26/19 1033 6     Pain Loc --      Pain Edu? --      Excl. in  [place]? --    No data found.  Updated Vital Signs BP 108/82 (BP Location: Left Arm)   Pulse 89   Temp 98.6 F (37 C) (Oral)   Resp 16   Ht 5\' 3"  (1.6 m)   Wt 215 lb (97.5 kg)   LMP 10/12/2019   SpO2 100%   BMI 38.09 kg/m   Visual Acuity Right Eye Distance:   Left Eye Distance:   Bilateral Distance:    Right Eye Near:   Left Eye Near:    Bilateral Near:     Physical Exam Vitals and nursing note reviewed.  Constitutional:      General: She is not in acute distress.    Appearance: Normal appearance. She is not ill-appearing, toxic-appearing or diaphoretic.  HENT:     Head: Normocephalic.  Eyes:     Conjunctiva/sclera: Conjunctivae normal.  Pulmonary:     Effort: Pulmonary effort is normal.  Musculoskeletal:        General: Normal range of motion.     Cervical back: Normal range of motion.  Skin:    General: Skin is warm and dry.     Findings: No rash.  Neurological:     Mental Status: She is alert.  Psychiatric:        Mood and Affect: Mood normal.      UC Treatments / Results  Labs (all labs ordered are listed, but only abnormal results are displayed) Labs Reviewed  SARS CORONAVIRUS 2 (TAT 6-24 HRS)    EKG   Radiology No results found.  Procedures Procedures (including critical care time)  Medications Ordered in UC Medications - No data to display  Initial Impression / Assessment and Plan / UC Course  I have reviewed the triage vital signs and the nursing notes.  Pertinent labs & imaging results that were available during my care of the patient were reviewed by me and considered in my medical decision making (see chart for details).     Disturbance of sensation of smell and taste This likely from Covid.  It has been over 2 weeks.  She is no longer contagious. Covid swab pending Recommended vitamin C and over-the-counter medicines for symptoms as needed Follow  up as needed for continued or worsening symptoms  Final Clinical Impressions(s) /  UC Diagnoses   Final diagnoses:  Disturbances of sensation of smell and taste     Discharge Instructions     Vitamin C 500 twice a day Zinc 50 mg daily Vitamin 5000 daily.   Flonase and zyrtec Follow up as needed for continued or worsening symptoms    ED Prescriptions    None     PDMP not reviewed this encounter.   Loura Halt A, NP 10/26/19 1108

## 2019-10-26 NOTE — Discharge Instructions (Addendum)
Vitamin C 500 twice a day Zinc 50 mg daily Vitamin 5000 daily.   Flonase and zyrtec Follow up as needed for continued or worsening symptoms

## 2019-10-26 NOTE — ED Triage Notes (Signed)
Patient c/o loss of taste and smell x 2 week ago. Pt. Stated with headaches and sore throat which went away.

## 2019-10-29 ENCOUNTER — Ambulatory Visit (HOSPITAL_COMMUNITY)
Admission: EM | Admit: 2019-10-29 | Discharge: 2019-10-29 | Disposition: A | Discharge status: Home / Self Care | Payer: No Typology Code available for payment source | Attending: Family Medicine | Admitting: Family Medicine

## 2019-10-29 ENCOUNTER — Other Ambulatory Visit: Payer: Self-pay

## 2019-10-29 ENCOUNTER — Encounter (HOSPITAL_COMMUNITY): Payer: Self-pay

## 2019-10-29 DIAGNOSIS — J0100 Acute maxillary sinusitis, unspecified: Secondary | ICD-10-CM | POA: Diagnosis not present

## 2019-10-29 MED ORDER — FLUTICASONE PROPIONATE 50 MCG/ACT NA SUSP
1.0000 | Freq: Two times a day (BID) | NASAL | 2 refills | Status: DC
Start: 2019-10-29 — End: 2021-03-23

## 2019-10-29 MED ORDER — AMOXICILLIN-POT CLAVULANATE 875-125 MG PO TABS
1.0000 | ORAL_TABLET | Freq: Two times a day (BID) | ORAL | 0 refills | Status: DC
Start: 2019-10-29 — End: 2020-06-11

## 2019-10-29 NOTE — ED Triage Notes (Signed)
Pt presents with continued nasal congestion, sinus pressure, and headache X 1 month.  Pt had negative covid test a few days ago.

## 2019-10-29 NOTE — Discharge Instructions (Signed)
Use the flonase twice daily, point up nostril and then out toward corner of eye as you are using. Continue sinus rinses, allergy medication daily

## 2019-10-29 NOTE — ED Provider Notes (Signed)
[place]    CSN: [id number] Arrival date & time: 10/29/19  0240      History   Chief Complaint Chief Complaint  Patient presents with  . Sinus Issues    HPI Amanda Cox is a 44 y.o. female.   Over a month of sinus pain and pressure, altered taste and smell, sinus headaches, intermittent sore throat. Takes flonase, zyrtec, sinus rinses and saline drops, doing humidifiers all without relief. Several negative COVID tests the past month, including several days ago. Denies fever, chills, sweats, body aches, cough, CP, SOB, N/V/D. Hx of allergic rhinitis and sinusitis.      Past Medical History:  Diagnosis Date  . Chronic insomnia 11/08/2016  . Dyspareunia   . Essential hypertension, benign   . Genital herpes, unspecified   . Headache syndrome 11/08/2016  . Irregular menstrual cycle   . Surveillance of previously prescribed contraceptive pill     Patient Active Problem List   Diagnosis Date Noted  . Routine general medical examination at a health care facility 08/02/2018  . Constipation 04/11/2018  . Chronic insomnia 11/08/2016  . Snoring 08/19/2013  . Obesity, unspecified 08/19/2013  . Essential hypertension, benign 07/10/2013  . Genital herpes 07/17/2012    Past Surgical History:  Procedure Laterality Date  . BREAST REDUCTION SURGERY    . CESAREAN SECTION      OB History    Gravida  2   Para  1   Term  1   Preterm      AB  1   Living  1     SAB      TAB  1   Ectopic      Multiple      Live Births  1            Home Medications    Prior to Admission medications   Medication Sig Start Date End Date Taking? Authorizing Provider  amoxicillin-clavulanate (AUGMENTIN) 875-125 MG tablet Take 1 tablet by mouth every 12 (twelve) hours. 10/29/19   Volney American, PA-C  fluticasone [facility]) 50 MCG/ACT nasal spray Place 1 spray into both nostrils 2 (two) times daily. 10/29/19   Volney American, PA-C    hydrochlorothiazide (HYDRODIURIL) 25 MG tablet Take 1 tablet (25 mg total) by mouth daily. 07/22/18   [name], MD  ibuprofen (ADVIL) 800 MG tablet Take 1 tablet (800 mg total) by mouth every 8 (eight) hours as needed. 07/04/19   [name], MD  metroNIDAZOLE (METROGEL VAGINAL) 0.75 % vaginal gel Place 1 Applicatorful vaginally 2 (two) times daily. 07/04/19   [name], MD  potassium chloride SA (KLOR-CON) 20 MEQ tablet Take 1 tablet (20 mEq total) by mouth 3 (three) times daily for 5 days. 05/11/19 05/16/19  Wieters, Hallie C, PA-C  TRI-LO-SPRINTEC 0.18/0.215/0.25 MG-25 MCG tab TAKE 1 TABLET BY MOUTH EVERY DAY 06/23/19   [name], MD  valACYclovir (VALTREX) 500 MG tablet Take 1 tablet (500 mg total) by mouth 2 (two) times daily. Take for 3 days prn each occurrence. 07/04/19   [name], MD    Family History Family History  Problem Relation Age of Onset  . Diabetes Mother   . Hyperlipidemia Mother   . Arthritis Mother   . Diabetes Father   . Diabetes Sister   . Arthritis Paternal Grandmother   . Hypertension Paternal Grandmother   . Congenital heart disease Other   . Colon cancer Neg Hx   .  Rectal cancer Neg Hx   . Esophageal cancer Neg Hx   . Stomach cancer Neg Hx   . Liver cancer Neg Hx     Social History Social History   Tobacco Use  . Smoking status: Never Smoker  . Smokeless tobacco: Never Used  Vaping Use  . Vaping Use: Never used  Substance Use Topics  . Alcohol use: Yes    Alcohol/week: 0.0 standard drinks    Comment: rare- once every 3 months  . Drug use: No     Allergies   Sulfa antibiotics   Review of Systems Review of Systems PER HPI   Physical Exam Triage Vital Signs ED Triage Vitals  Enc Vitals Group     BP 10/29/19 0820 (!) 130/93     Pulse Rate 10/29/19 0820 88     Resp 10/29/19 0820 16     Temp 10/29/19 0820 98.4 F (36.9 C)     Temp Source 10/29/19 0820 Oral     SpO2 10/29/19 0820 99 %     Weight --       Height --      Head Circumference --      Peak Flow --      Pain Score 10/29/19 0830 6     Pain Loc --      Pain Edu? --      Excl. in [place]? --    No data found.  Updated Vital Signs BP (!) 130/93 (BP Location: Left Arm)   Pulse 88   Temp 98.4 F (36.9 C) (Oral)   Resp 16   LMP 10/12/2019   SpO2 99%   Visual Acuity Right Eye Distance:   Left Eye Distance:   Bilateral Distance:    Right Eye Near:   Left Eye Near:    Bilateral Near:     Physical Exam Vitals and nursing note reviewed.  Constitutional:      Appearance: Normal appearance.  HENT:     Head: Atraumatic.     Right Ear: Tympanic membrane and external ear normal.     Left Ear: Tympanic membrane and external ear normal.     Nose: Congestion present.     Comments: Maxillary sinuses ttp b/l     Mouth/Throat:     Mouth: Mucous membranes are moist.     Pharynx: Posterior oropharyngeal erythema present.     Comments: Nasal turbinates significantly erythematous and edematous Eyes:     Extraocular Movements: Extraocular movements intact.     Conjunctiva/sclera: Conjunctivae normal.  Cardiovascular:     Rate and Rhythm: Normal rate and regular rhythm.     Heart sounds: Normal heart sounds.  Pulmonary:     Effort: Pulmonary effort is normal.     Breath sounds: Normal breath sounds. No wheezing or rales.  Abdominal:     General: Bowel sounds are normal. There is no distension.     Palpations: Abdomen is soft.     Tenderness: There is no abdominal tenderness.  Musculoskeletal:        General: Normal range of motion.     Cervical back: Normal range of motion and neck supple.  Skin:    General: Skin is warm and dry.  Neurological:     Mental Status: She is alert and oriented to person, place, and time.  Psychiatric:        Mood and Affect: Mood normal.        Thought Content: Thought content normal.    UC Treatments /  Results  Labs (all labs ordered are listed, but only abnormal results are  displayed) Labs Reviewed - No data to display  EKG   Radiology No results found.  Procedures Procedures (including critical care time)  Medications Ordered in UC Medications - No data to display  Initial Impression / Assessment and Plan / UC Course  I have reviewed the triage vital signs and the nursing notes.  Pertinent labs & imaging results that were available during my care of the patient were reviewed by me and considered in my medical decision making (see chart for details).     Hx and exam consistent with maxillary sinusitis - tx with augmentin, continued allergy regimen with zyrtec and flonase, and OTC symptomatic care with sinus rinses, mucinex, nyquil prn. F/u with PCP if not resolving.    Final Clinical Impressions(s) / UC Diagnoses   Final diagnoses:  Acute non-recurrent maxillary sinusitis     Discharge Instructions     Use the flonase twice daily, point up nostril and then out toward corner of eye as you are using. Continue sinus rinses, allergy medication daily    ED Prescriptions    Medication Sig Dispense Auth. Provider   amoxicillin-clavulanate (AUGMENTIN) 875-125 MG tablet Take 1 tablet by mouth every 12 (twelve) hours. 14 tablet Volney American, PA-C   fluticasone [facility]) 50 MCG/ACT nasal spray Place 1 spray into both nostrils 2 (two) times daily. 16 g Volney American, [place]     PDMP not reviewed this encounter.   Merrie Roof [place], [place] 10/29/19 [id number]

## 2019-11-01 ENCOUNTER — Telehealth (HOSPITAL_COMMUNITY): Payer: Self-pay | Admitting: Family Medicine

## 2019-11-01 MED ORDER — PREDNISONE 20 MG PO TABS
40.0000 mg | ORAL_TABLET | Freq: Every day | ORAL | 0 refills | Status: DC
Start: 2019-11-01 — End: 2020-06-11

## 2019-11-01 NOTE — Telephone Encounter (Signed)
Patient called, states not improving on abx for sinusitis. Prednisone sent, advised to take antihistamine daily in addition to flonase regimen

## 2019-11-17 ENCOUNTER — Ambulatory Visit
Admission: RE | Admit: 2019-11-17 | Discharge: 2019-11-17 | Disposition: A | Discharge status: Home / Self Care | Payer: No Typology Code available for payment source | Source: Ambulatory Visit | Attending: Physician Assistant | Admitting: Physician Assistant

## 2019-11-17 ENCOUNTER — Other Ambulatory Visit: Payer: Self-pay

## 2019-11-17 DIAGNOSIS — Z1231 Encounter for screening mammogram for malignant neoplasm of breast: Secondary | ICD-10-CM

## 2019-12-02 IMAGING — CR DG CHEST 2V
2 series · 2 of 2 positions shown · non-contrast
Comparison: 01/28/2016

CLINICAL DATA: Mid chest pain for 2 months.

EXAM:
CHEST - 2 VIEW

[w chest pa]
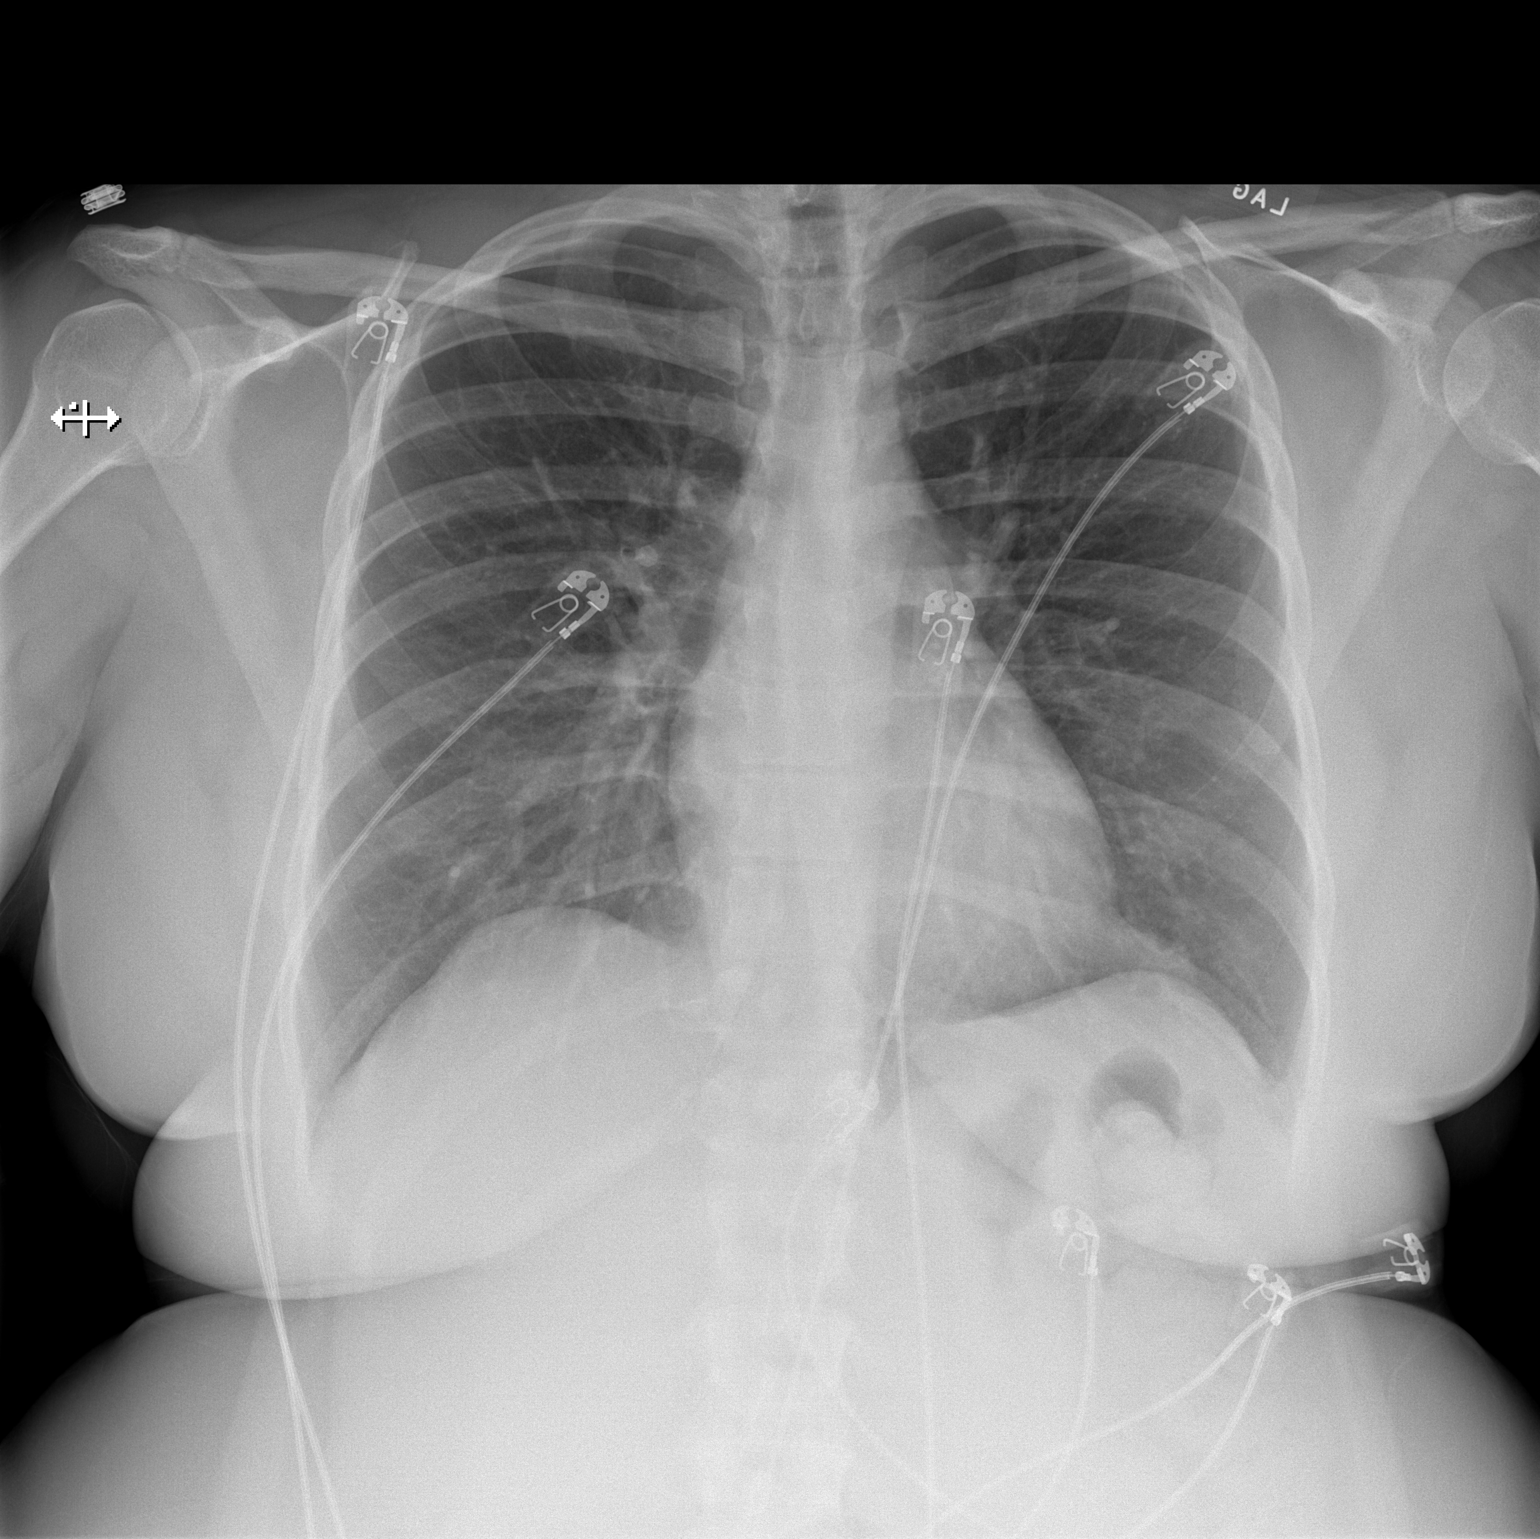

[w chest lat]
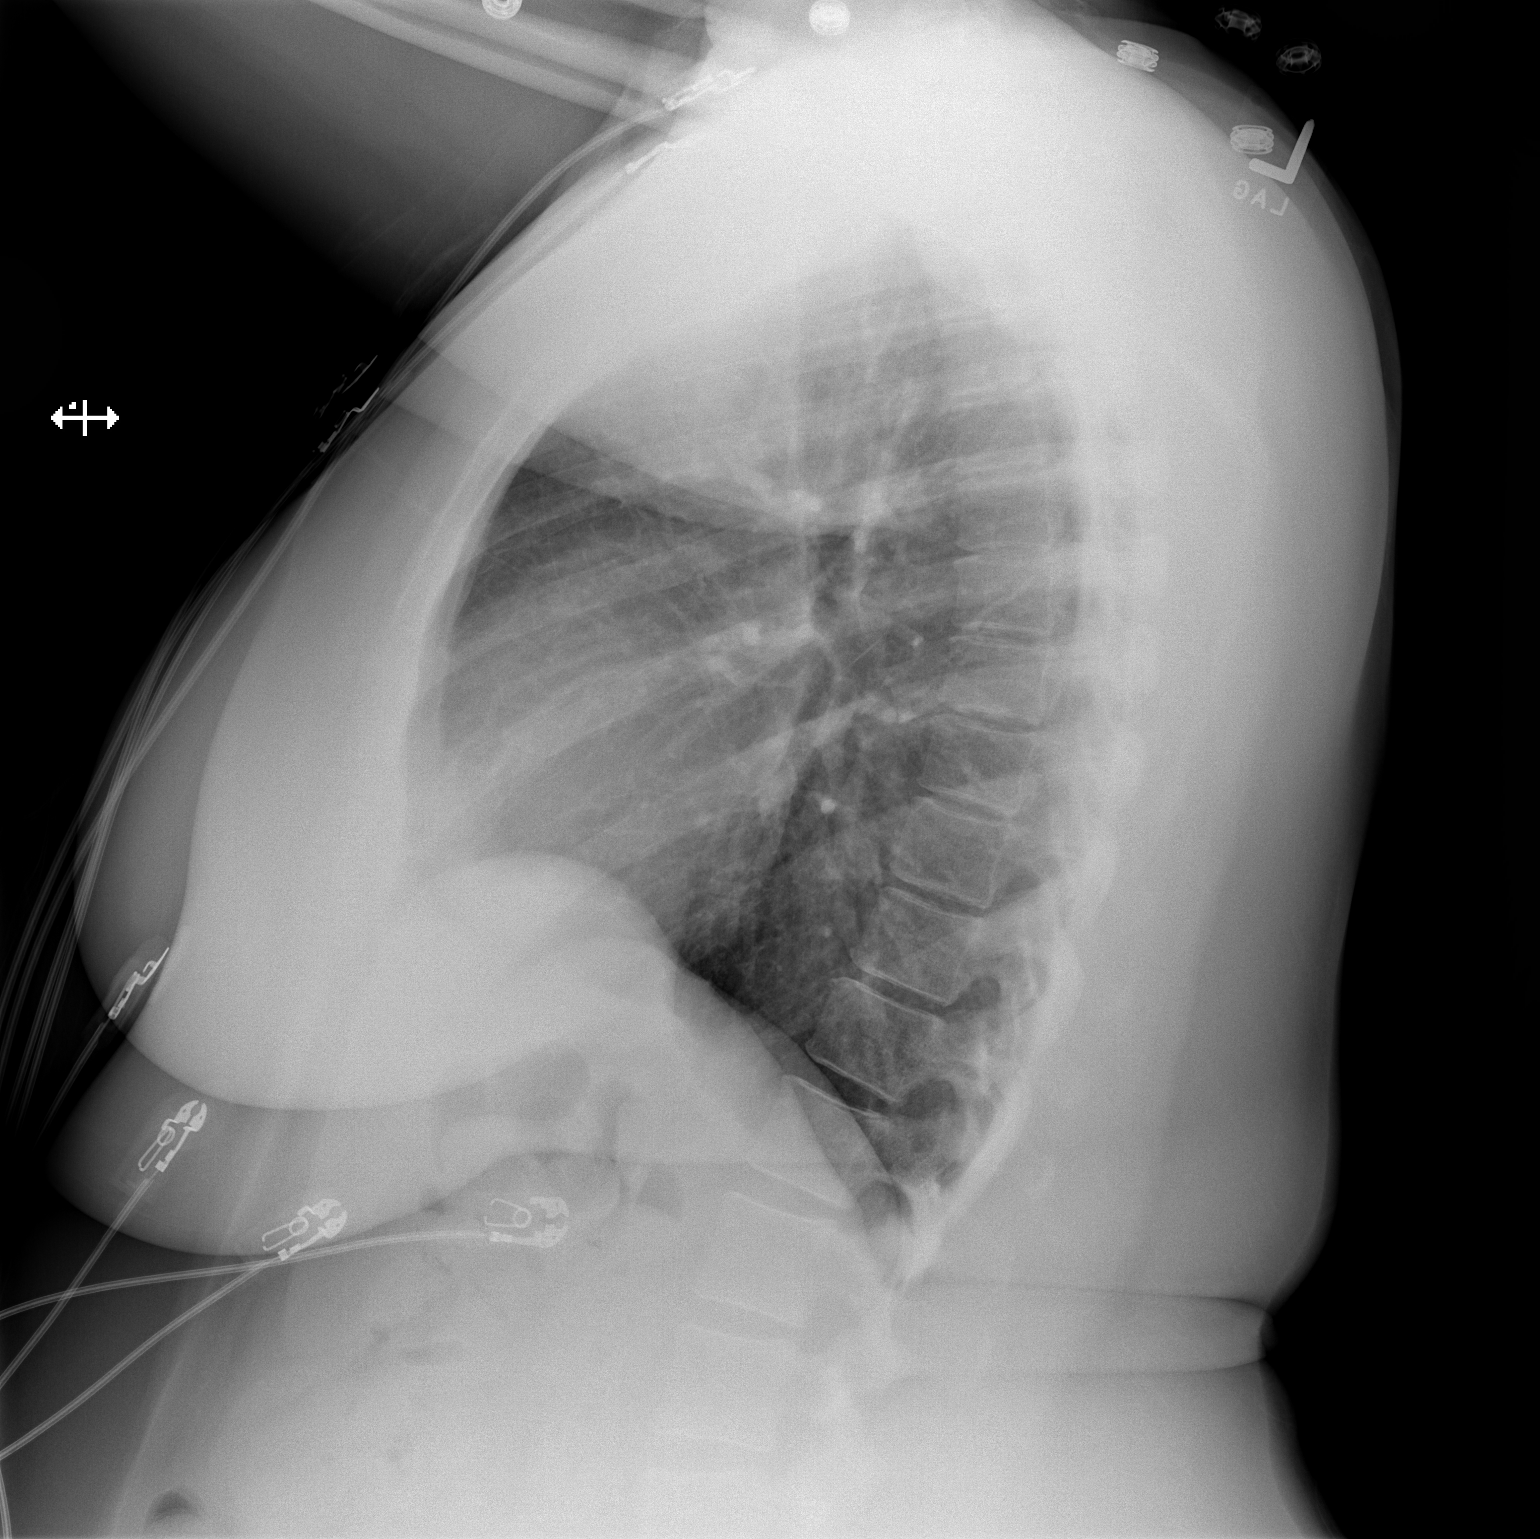

[2 of 2 positions shown; findings below may reference images not displayed]

FINDINGS: The cardiomediastinal contours are normal. The lungs are clear.
Pulmonary vasculature is normal. No consolidation, pleural effusion,
or pneumothorax. No acute osseous abnormalities are seen.
IMPRESSION: No active cardiopulmonary disease.

## 2020-01-25 ENCOUNTER — Ambulatory Visit (HOSPITAL_COMMUNITY)
Admission: EM | Admit: 2020-01-25 | Discharge: 2020-01-25 | Disposition: A | Discharge status: Home / Self Care | Payer: No Typology Code available for payment source | Attending: Family Medicine | Admitting: Family Medicine

## 2020-01-25 ENCOUNTER — Encounter (HOSPITAL_COMMUNITY): Payer: Self-pay | Admitting: *Deleted

## 2020-01-25 ENCOUNTER — Other Ambulatory Visit: Payer: Self-pay

## 2020-01-25 DIAGNOSIS — N760 Acute vaginitis: Secondary | ICD-10-CM | POA: Insufficient documentation

## 2020-01-25 LAB — POCT URINALYSIS DIPSTICK, ED / UC
Glucose, UA: NEGATIVE mg/dL
Ketones, ur: 40 mg/dL — AB
Leukocytes,Ua: NEGATIVE
Nitrite: NEGATIVE
Protein, ur: 30 mg/dL — AB
Specific Gravity, Urine: 1.02 (ref 1.005–1.030)
Urobilinogen, UA: 0.2 mg/dL (ref 0.0–1.0)
pH: 6 (ref 5.0–8.0)

## 2020-01-25 NOTE — ED Triage Notes (Signed)
PT reports one month of vag DC and has been treated 2 times . Pt also wants STD check.

## 2020-01-25 NOTE — ED Provider Notes (Signed)
[place]    CSN: [id number] Arrival date & time: 01/25/20  1002      History   Chief Complaint Chief Complaint  Patient presents with  . Vaginal Discharge  . Exposure to STD    HPI Amanda Cox is a 44 y.o. female.   Presenting with 1 month of ongoing vaginal itching and irritation, urinary frequency. Denies fever, chills, N/V/D, flank pain, hematuria, dysuria, rashes, known exposure to STIs. Has been taking metrogel, monistat, and diflucan without relief.      Past Medical History:  Diagnosis Date  . Chronic insomnia 11/08/2016  . Dyspareunia   . Essential hypertension, benign   . Genital herpes, unspecified   . Headache syndrome 11/08/2016  . Irregular menstrual cycle   . Surveillance of previously prescribed contraceptive pill     Patient Active Problem List   Diagnosis Date Noted  . Routine general medical examination at a health care facility 08/02/2018  . Constipation 04/11/2018  . Chronic insomnia 11/08/2016  . Snoring 08/19/2013  . Obesity, unspecified 08/19/2013  . Essential hypertension, benign 07/10/2013  . Genital herpes 07/17/2012    Past Surgical History:  Procedure Laterality Date  . BREAST REDUCTION SURGERY    . CESAREAN SECTION      OB History    Gravida  2   Para  1   Term  1   Preterm      AB  1   Living  1     SAB      TAB  1   Ectopic      Multiple      Live Births  1            Home Medications    Prior to Admission medications   Medication Sig Start Date End Date Taking? Authorizing Provider  fluticasone (FLONASE) 50 MCG/ACT nasal spray Place 1 spray into both nostrils 2 (two) times daily. 10/29/19  Yes Volney American, PA-C  hydrochlorothiazide (HYDRODIURIL) 25 MG tablet Take 1 tablet (25 mg total) by mouth daily. 07/22/18  Yes [name], MD  TRI-LO-SPRINTEC 0.18/0.215/0.25 MG-25 MCG tab TAKE 1 TABLET BY MOUTH EVERY DAY 06/23/19  Yes [name], MD  valACYclovir  (VALTREX) 500 MG tablet Take 1 tablet (500 mg total) by mouth 2 (two) times daily. Take for 3 days prn each occurrence. 07/04/19  Yes [name], MD  amoxicillin-clavulanate (AUGMENTIN) 875-125 MG tablet Take 1 tablet by mouth every 12 (twelve) hours. 10/29/19   Volney American, PA-C  ibuprofen (ADVIL) 800 MG tablet Take 1 tablet (800 mg total) by mouth every 8 (eight) hours as needed. 07/04/19   [name], MD  metroNIDAZOLE (METROGEL VAGINAL) 0.75 % vaginal gel Place 1 Applicatorful vaginally 2 (two) times daily. 07/04/19   [name], MD  potassium chloride SA (KLOR-CON) 20 MEQ tablet Take 1 tablet (20 mEq total) by mouth 3 (three) times daily for 5 days. 05/11/19 05/16/19  Wieters, Hallie C, PA-C  predniSONE (DELTASONE) 20 MG tablet Take 2 tablets (40 mg total) by mouth daily with breakfast. 11/01/19   Volney American, PA-C    Family History Family History  Problem Relation Age of Onset  . Diabetes Mother   . Hyperlipidemia Mother   . Arthritis Mother   . Diabetes Father   . Diabetes Sister   . Arthritis Paternal Grandmother   . Hypertension Paternal Grandmother   . Congenital heart disease Other   . Colon  cancer Neg Hx   . Rectal cancer Neg Hx   . Esophageal cancer Neg Hx   . Stomach cancer Neg Hx   . Liver cancer Neg Hx     Social History Social History   Tobacco Use  . Smoking status: Never Smoker  . Smokeless tobacco: Never Used  Vaping Use  . Vaping Use: Never used  Substance Use Topics  . Alcohol use: Yes    Alcohol/week: 0.0 standard drinks    Comment: rare- once every 3 months  . Drug use: No     Allergies   Sulfa antibiotics   Review of Systems Review of Systems PER HPI   Physical Exam Triage Vital Signs ED Triage Vitals  Enc Vitals Group     BP 01/25/20 1011 126/88     Pulse Rate 01/25/20 1011 92     Resp 01/25/20 1011 18     Temp 01/25/20 1011 99.2 F (37.3 C)     Temp Source 01/25/20 1011 Oral     SpO2 01/25/20  1011 99 %     Weight 01/25/20 1013 216 lb (98 kg)     Height 01/25/20 1013 5\' 3"  (1.6 m)     Head Circumference --      Peak Flow --      Pain Score 01/25/20 1012 0     Pain Loc --      Pain Edu? --      Excl. in [place]? --    No data found.  Updated Vital Signs BP 126/88 (BP Location: Right Arm)   Pulse 92   Temp 99.2 F (37.3 C) (Oral)   Resp 18   Ht 5\' 3"  (1.6 m)   Wt 216 lb (98 kg)   LMP 01/04/2020   SpO2 99%   BMI 38.26 kg/m   Visual Acuity Right Eye Distance:   Left Eye Distance:   Bilateral Distance:    Right Eye Near:   Left Eye Near:    Bilateral Near:     Physical Exam Vitals and nursing note reviewed.  Constitutional:      Appearance: Normal appearance. She is not ill-appearing.  HENT:     Head: Atraumatic.     Mouth/Throat:     Mouth: Mucous membranes are moist.     Pharynx: Oropharynx is clear.  Eyes:     Extraocular Movements: Extraocular movements intact.     Conjunctiva/sclera: Conjunctivae normal.  Cardiovascular:     Rate and Rhythm: Normal rate and regular rhythm.     Heart sounds: Normal heart sounds.  Pulmonary:     Effort: Pulmonary effort is normal.     Breath sounds: Normal breath sounds.  Abdominal:     General: Bowel sounds are normal. There is no distension.     Palpations: Abdomen is soft.     Tenderness: There is no abdominal tenderness. There is no right CVA tenderness, left CVA tenderness or guarding.  Genitourinary:    Comments: GU exam deferred, self swab performed Musculoskeletal:        General: Normal range of motion.     Cervical back: Normal range of motion and neck supple.  Skin:    General: Skin is warm and dry.  Neurological:     Mental Status: She is alert and oriented to person, place, and time.  Psychiatric:        Mood and Affect: Mood normal.        Thought Content: Thought content normal.  Judgment: Judgment normal.     UC Treatments / Results  Labs (all labs ordered are listed, but only  abnormal results are displayed) Labs Reviewed  POCT URINALYSIS DIPSTICK, ED / UC - Abnormal; Notable for the following components:      Result Value   Bilirubin Urine SMALL (*)    Ketones, ur 40 (*)    Hgb urine dipstick SMALL (*)    Protein, ur 30 (*)    All other components within normal limits  CERVICOVAGINAL ANCILLARY ONLY    EKG   Radiology No results found.  Procedures Procedures (including critical care time)  Medications Ordered in UC Medications - No data to display  Initial Impression / Assessment and Plan / UC Course  I have reviewed the triage vital signs and the nursing notes.  Pertinent labs & imaging results that were available during my care of the patient were reviewed by me and considered in my medical decision making (see chart for details).     U/A without evidence of UTI, aptima swab pending. Discussed supportive OTC care and good vaginal hygiene. Treat based on results.   Final Clinical Impressions(s) / UC Diagnoses   Final diagnoses:  Acute vaginitis   Discharge Instructions   None    ED Prescriptions    None     PDMP not reviewed this encounter.   Volney American, [place] 01/25/20 1111

## 2020-01-26 LAB — CERVICOVAGINAL ANCILLARY ONLY
Bacterial Vaginitis (gardnerella): NEGATIVE
Candida Glabrata: NEGATIVE
Candida Vaginitis: NEGATIVE
Chlamydia: NEGATIVE
Comment: NEGATIVE
Comment: NEGATIVE
Comment: NEGATIVE
Comment: NEGATIVE
Comment: NEGATIVE
Comment: NORMAL
Neisseria Gonorrhea: NEGATIVE
Trichomonas: NEGATIVE

## 2020-02-10 ENCOUNTER — Telehealth: Payer: Self-pay | Admitting: *Deleted

## 2020-02-10 ENCOUNTER — Other Ambulatory Visit: Payer: Self-pay | Admitting: Obstetrics

## 2020-02-10 DIAGNOSIS — N898 Other specified noninflammatory disorders of vagina: Secondary | ICD-10-CM

## 2020-02-10 MED ORDER — FLUCONAZOLE 150 MG PO TABS: 150.0000 mg | ORAL_TABLET | Freq: Once | ORAL | 0 refills | Status: DC

## 2020-02-10 NOTE — Telephone Encounter (Signed)
Pt called to office and ask for refill on Diflucan. Pt was recently seen at Urgent Care with negative results.  Pt states she has since taken an at home test and shows she has yeast.   Pt states she does have problem with vaginal itching from time to time and sometimes BV.   Pt made aware request for Diflucan will be sent to provider for approval.    Please review and send Rx if approved.

## 2020-02-10 NOTE — Telephone Encounter (Signed)
Diflucan Rx for yeast 

## 2020-04-25 ENCOUNTER — Other Ambulatory Visit: Payer: Self-pay | Admitting: Obstetrics

## 2020-04-25 DIAGNOSIS — Z3041 Encounter for surveillance of contraceptive pills: Secondary | ICD-10-CM

## 2020-05-18 ENCOUNTER — Other Ambulatory Visit: Payer: Self-pay

## 2020-05-18 ENCOUNTER — Encounter (HOSPITAL_COMMUNITY): Payer: Self-pay | Admitting: Emergency Medicine

## 2020-05-18 ENCOUNTER — Ambulatory Visit (INDEPENDENT_AMBULATORY_CARE_PROVIDER_SITE_OTHER): Payer: No Typology Code available for payment source

## 2020-05-18 ENCOUNTER — Ambulatory Visit (HOSPITAL_COMMUNITY)
Admission: EM | Admit: 2020-05-18 | Discharge: 2020-05-18 | Disposition: A | Discharge status: Home / Self Care | Payer: No Typology Code available for payment source | Attending: Student | Admitting: Student

## 2020-05-18 DIAGNOSIS — M79671 Pain in right foot: Secondary | ICD-10-CM

## 2020-05-18 DIAGNOSIS — M25474 Effusion, right foot: Secondary | ICD-10-CM

## 2020-05-18 DIAGNOSIS — I10 Essential (primary) hypertension: Secondary | ICD-10-CM | POA: Diagnosis not present

## 2020-05-18 DIAGNOSIS — G44209 Tension-type headache, unspecified, not intractable: Secondary | ICD-10-CM | POA: Diagnosis not present

## 2020-05-18 DIAGNOSIS — M79674 Pain in right toe(s): Secondary | ICD-10-CM

## 2020-05-18 MED ORDER — BACITRACIN ZINC 500 UNIT/GM EX OINT
TOPICAL_OINTMENT | CUTANEOUS | Status: AC
  Filled 2020-05-18: qty 0.9

## 2020-05-18 NOTE — ED Triage Notes (Signed)
Pt presents with right pinky toe pain and swelling after hitting on metal suitcase last night.   Pt also c/o of pressure on left side of head xs 3-4 days, states is not a pain.

## 2020-05-18 NOTE — ED Provider Notes (Addendum)
[place]    CSN: [id number] Arrival date & time: 05/18/20  1718      History   Chief Complaint Chief Complaint  Patient presents with  . Toe Pain    Right    HPI Amanda Cox is a 45 y.o. female presenting with right pinky toe injury and headache.  History insomnia, hypertension, genital herpes, headache syndrome, irregular periods.   -Describes hitting her right pinky toe on a metal suitcase last night.  Since then endorses pain and swelling over the lateral aspect of her right foot.  Able to ambulate but with pain.  Denies sensation changes.  Denies injuries elsewhere. -Also notes left-sided dull headaches for the last 4 days.  States these typically come on at the end of the day and last a few hours.  Has tried nothing for these.  History of insomnia and states that she has not been sleeping up lately.  Denies increase in stress lately. Denies worst headache of life, thunderclap headache, weakness/sensation changes in arms/legs, vision changes, shortness of breath, chest pain/pressure, photophobia, phonophobia, n/v/d, allergic rhinitis, URI symptoms.    HPI  Past Medical History:  Diagnosis Date  . Chronic insomnia 11/08/2016  . Dyspareunia   . Essential hypertension, benign   . Genital herpes, unspecified   . Headache syndrome 11/08/2016  . Irregular menstrual cycle   . Surveillance of previously prescribed contraceptive pill     Patient Active Problem List   Diagnosis Date Noted  . Routine general medical examination at a health care facility 08/02/2018  . Constipation 04/11/2018  . Chronic insomnia 11/08/2016  . Snoring 08/19/2013  . Obesity, unspecified 08/19/2013  . Essential hypertension, benign 07/10/2013  . Genital herpes 07/17/2012    Past Surgical History:  Procedure Laterality Date  . BREAST REDUCTION SURGERY    . CESAREAN SECTION      OB History    Gravida  2   Para  1   Term  1   Preterm      AB  1   Living  1      SAB      IAB  1   Ectopic      Multiple      Live Births  1            Home Medications    Prior to Admission medications   Medication Sig Start Date End Date Taking? Authorizing Provider  amoxicillin-clavulanate (AUGMENTIN) 875-125 MG tablet Take 1 tablet by mouth every 12 (twelve) hours. 10/29/19   Volney American, PA-C  fluticasone [facility]) 50 MCG/ACT nasal spray Place 1 spray into both nostrils 2 (two) times daily. 10/29/19   Volney American, PA-C  hydrochlorothiazide (HYDRODIURIL) 25 MG tablet Take 1 tablet (25 mg total) by mouth daily. 07/22/18   [name], MD  ibuprofen (ADVIL) 800 MG tablet Take 1 tablet (800 mg total) by mouth every 8 (eight) hours as needed. 07/04/19   [name], MD  metroNIDAZOLE (METROGEL VAGINAL) 0.75 % vaginal gel Place 1 Applicatorful vaginally 2 (two) times daily. 07/04/19   [name], MD  potassium chloride SA (KLOR-CON) 20 MEQ tablet Take 1 tablet (20 mEq total) by mouth 3 (three) times daily for 5 days. 05/11/19 05/16/19  Wieters, Hallie C, PA-C  predniSONE (DELTASONE) 20 MG tablet Take 2 tablets (40 mg total) by mouth daily with breakfast. 11/01/19   Volney American, PA-C  TRI-LO-SPRINTEC 0.18/0.215/0.25 MG-25 MCG tab TAKE 1 TABLET  BY MOUTH EVERY DAY 04/26/20   [name], MD  valACYclovir (VALTREX) 500 MG tablet Take 1 tablet (500 mg total) by mouth 2 (two) times daily. Take for 3 days prn each occurrence. 07/04/19   [name], MD    Family History Family History  Problem Relation Age of Onset  . Diabetes Mother   . Hyperlipidemia Mother   . Arthritis Mother   . Diabetes Father   . Diabetes Sister   . Arthritis Paternal Grandmother   . Hypertension Paternal Grandmother   . Congenital heart disease Other   . Colon cancer Neg Hx   . Rectal cancer Neg Hx   . Esophageal cancer Neg Hx   . Stomach cancer Neg Hx   . Liver cancer Neg Hx     Social History Social History   Tobacco Use   . Smoking status: Never Smoker  . Smokeless tobacco: Never Used  Vaping Use  . Vaping Use: Never used  Substance Use Topics  . Alcohol use: Yes    Alcohol/week: 0.0 standard drinks    Comment: rare- once every 3 months  . Drug use: No     Allergies   Sulfa antibiotics   Review of Systems Review of Systems  Constitutional: Negative for appetite change, chills, fatigue and fever.  HENT: Negative for congestion, sinus pressure, sore throat, trouble swallowing and voice change.   Eyes: Negative for photophobia, pain, discharge, redness, itching and visual disturbance.  Respiratory: Negative for cough, chest tightness and shortness of breath.   Cardiovascular: Negative for chest pain, palpitations and leg swelling.  Gastrointestinal: Negative for abdominal pain, constipation, diarrhea, nausea and vomiting.  Genitourinary: Negative for dysuria, flank pain, frequency and urgency.  Musculoskeletal: Negative for back pain, gait problem, myalgias, neck pain and neck stiffness.       R foot pain  Neurological: Positive for headaches. Negative for dizziness, tremors, seizures, syncope, facial asymmetry, speech difficulty, weakness, light-headedness and numbness.  Psychiatric/Behavioral: Negative for agitation, decreased concentration, dysphoric mood, hallucinations and suicidal ideas. The patient is not nervous/anxious.   All other systems reviewed and are negative.    Physical Exam Triage Vital Signs ED Triage Vitals  Enc Vitals Group     BP 05/18/20 1751 126/74     Pulse Rate 05/18/20 1751 83     Resp 05/18/20 1751 17     Temp 05/18/20 1751 98.6 F (37 C)     Temp Source 05/18/20 1751 Oral     SpO2 05/18/20 1751 97 %     Weight --      Height --      Head Circumference --      Peak Flow --      Pain Score 05/18/20 1748 6     Pain Loc --      Pain Edu? --      Excl. in [place]? --    No data found.  Updated Vital Signs BP 126/74 (BP Location: Right Arm)   Pulse 83   Temp  98.6 F (37 C) (Oral)   Resp 17   LMP 04/20/2020   SpO2 97%   Visual Acuity Right Eye Distance:   Left Eye Distance:   Bilateral Distance:    Right Eye Near:   Left Eye Near:    Bilateral Near:     Physical Exam Vitals reviewed.  Constitutional:      General: She is not in acute distress.    Appearance: Normal appearance. She is not  ill-appearing.  HENT:     Head: Normocephalic and atraumatic.  Eyes:     Extraocular Movements: Extraocular movements intact.     Pupils: Pupils are equal, round, and reactive to light.  Cardiovascular:     Rate and Rhythm: Normal rate and regular rhythm.     Heart sounds: Normal heart sounds.  Pulmonary:     Effort: Pulmonary effort is normal.     Breath sounds: Normal breath sounds. No wheezing, rhonchi or rales.  Musculoskeletal:     Cervical back: Normal range of motion and neck supple. No rigidity.     Comments: R foot with swelling and tenderness of distal 5th metatarsal. No bony deformity, ecchymosis, abrasion. ROM toes and ankle intact without pain. Sensation intact, neurovascularly intact. Cap refill <2 seconds, DP 2+.  Lymphadenopathy:     Cervical: No cervical adenopathy.  Neurological:     General: No focal deficit present.     Mental Status: She is alert and oriented to person, place, and time. Mental status is at baseline.     Cranial Nerves: Cranial nerves are intact. No cranial nerve deficit or facial asymmetry.     Sensory: Sensation is intact. No sensory deficit.     Motor: Motor function is intact. No weakness.     Coordination: Coordination is intact. Coordination normal.     Gait: Gait is intact. Gait normal.     Comments: CN 2-12 intact. No weakness or numbness in UEs or LEs.  Psychiatric:        Mood and Affect: Mood normal.        Behavior: Behavior normal.        Thought Content: Thought content normal.        Judgment: Judgment normal.      UC Treatments / Results  Labs (all labs ordered are listed, but  only abnormal results are displayed) Labs Reviewed - No data to display  EKG   Radiology DG Foot Complete Right  Result Date: 05/18/2020 CLINICAL DATA:  Pain and swelling EXAM: RIGHT FOOT COMPLETE - 3+ VIEW COMPARISON:  None. FINDINGS: There is no evidence of fracture or dislocation. There is no evidence of arthropathy or other focal bone abnormality. Soft tissues are unremarkable. IMPRESSION: Negative. Electronically Signed   By: Donavan Foil M.D.   On: 05/18/2020 18:22    Procedures Procedures (including critical care time)  Medications Ordered in UC Medications - No data to display  Initial Impression / Assessment and Plan / UC Course  I have reviewed the triage vital signs and the nursing notes.  Pertinent labs & imaging results that were available during my care of the patient were reviewed by me and considered in my medical decision making (see chart for details).     This patient is a 45 year old female presenting with tension headaches and R 5th toe jam. Today this pt is afebrile nontachycardic nontachypneic, oxygenating well on room air, no wheezes rhonchi or rales. Benign neuro exam. Neurovascularly intact.  For tension headaches, rec tylenol/ibuprofen, rest, good hydration.  Xray R foot- negative. Films interpreted by myself and radiologist.  Postop shoe provided. RICE.    For hypertension, continue current regimen.   Return precautions discussed.   This chart was dictated using voice recognition software, Dragon. Despite the best efforts of this provider to proofread and correct errors, errors may still occur which can change documentation meaning.  Final Clinical Impressions(s) / UC Diagnoses   Final diagnoses:  Tension headache  Essential hypertension  Toe pain, right     Discharge Instructions     -For your headaches, try tylenol and ibuprofen. Tylenol 1000 mg 3 times daily, and ibuprofen 800 mg 3 times daily with food.  You can take these together,  or alternate every 3-4 hours. Make sure you're sleeping enough and drinking plenty of water. -Use your shoe for the next 5 to 7 days, or as long as you are having pain.  You can also use Tylenol and ibuprofen for your toe pain.  Also try elevating her foot and icing it at the end of the day. -For hypertension, continue hctz. Please check your blood pressure at home or at the pharmacy. If this continues to be >140/90, follow-up with your primary care provider for further blood pressure management/ medication titration. If you develop chest pain, shortness of breath, vision changes, the worst headache of your life- head straight to the ED or call 911.     ED Prescriptions    None     PDMP not reviewed this encounter.   Hazel Sams, PA-C 05/18/20 1837    Hazel Sams, PA-C 05/18/20 Bosie Helper

## 2020-05-18 NOTE — Discharge Instructions (Addendum)
-  For your headaches, try tylenol and ibuprofen. Tylenol 1000 mg 3 times daily, and ibuprofen 800 mg 3 times daily with food.  You can take these together, or alternate every 3-4 hours. Make sure you're sleeping enough and drinking plenty of water. -Use your shoe for the next 5 to 7 days, or as long as you are having pain.  You can also use Tylenol and ibuprofen for your toe pain.  Also try elevating her foot and icing it at the end of the day. -For hypertension, continue hctz. Please check your blood pressure at home or at the pharmacy. If this continues to be >140/90, follow-up with your primary care provider for further blood pressure management/ medication titration. If you develop chest pain, shortness of breath, vision changes, the worst headache of your life- head straight to the ED or call 911.

## 2020-06-01 ENCOUNTER — Other Ambulatory Visit: Payer: Self-pay | Admitting: Obstetrics

## 2020-06-01 DIAGNOSIS — N898 Other specified noninflammatory disorders of vagina: Secondary | ICD-10-CM

## 2020-06-11 ENCOUNTER — Other Ambulatory Visit: Payer: Self-pay

## 2020-06-11 ENCOUNTER — Ambulatory Visit (HOSPITAL_COMMUNITY)
Admission: EM | Admit: 2020-06-11 | Discharge: 2020-06-11 | Disposition: A | Discharge status: Home / Self Care | Payer: No Typology Code available for payment source | Attending: Physician Assistant | Admitting: Physician Assistant

## 2020-06-11 ENCOUNTER — Encounter (HOSPITAL_COMMUNITY): Payer: Self-pay

## 2020-06-11 ENCOUNTER — Ambulatory Visit (INDEPENDENT_AMBULATORY_CARE_PROVIDER_SITE_OTHER): Payer: No Typology Code available for payment source

## 2020-06-11 DIAGNOSIS — M25512 Pain in left shoulder: Secondary | ICD-10-CM

## 2020-06-11 DIAGNOSIS — G8929 Other chronic pain: Secondary | ICD-10-CM | POA: Diagnosis present

## 2020-06-11 DIAGNOSIS — R202 Paresthesia of skin: Secondary | ICD-10-CM

## 2020-06-11 DIAGNOSIS — M25612 Stiffness of left shoulder, not elsewhere classified: Secondary | ICD-10-CM | POA: Diagnosis not present

## 2020-06-11 DIAGNOSIS — R200 Anesthesia of skin: Secondary | ICD-10-CM | POA: Insufficient documentation

## 2020-06-11 LAB — COMPREHENSIVE METABOLIC PANEL
ALT: 14 U/L (ref 0–44)
AST: 15 U/L (ref 15–41)
Albumin: 3.6 g/dL (ref 3.5–5.0)
Alkaline Phosphatase: 50 U/L (ref 38–126)
Anion gap: 7 (ref 5–15)
BUN: 13 mg/dL (ref 6–20)
CO2: 27 mmol/L (ref 22–32)
Calcium: 9.1 mg/dL (ref 8.9–10.3)
Chloride: 101 mmol/L (ref 98–111)
Creatinine, Ser: 0.66 mg/dL (ref 0.44–1.00)
GFR, Estimated: >60 mL/min (ref >60)
Glucose, Bld: 87 mg/dL (ref 70–99)
Potassium: 4.4 mmol/L (ref 3.5–5.1)
Sodium: 135 mmol/L (ref 135–145)
Total Bilirubin: 0.5 mg/dL (ref 0.3–1.2)
Total Protein: 6.7 g/dL (ref 6.5–8.1)

## 2020-06-11 LAB — CBC WITH DIFFERENTIAL/PLATELET
Abs Immature Granulocytes: 0.02 10*3/uL (ref 0.00–0.07)
Basophils Absolute: 0.1 10*3/uL (ref 0.0–0.1)
Basophils Relative: 1 %
Eosinophils Absolute: 0.1 10*3/uL (ref 0.0–0.5)
Eosinophils Relative: 1 %
HCT: 37.2 % (ref 36.0–46.0)
Hemoglobin: 11.9 g/dL — ABNORMAL LOW (ref 12.0–15.0)
Immature Granulocytes: 0 %
Lymphocytes Relative: 26 %
Lymphs Abs: 2.9 10*3/uL (ref 0.7–4.0)
MCH: 28.2 pg (ref 26.0–34.0)
MCHC: 32 g/dL (ref 30.0–36.0)
MCV: 88.2 fL (ref 80.0–100.0)
Monocytes Absolute: 0.8 10*3/uL (ref 0.1–1.0)
Monocytes Relative: 7 %
Neutro Abs: 7.3 10*3/uL (ref 1.7–7.7)
Neutrophils Relative %: 65 %
Platelets: 363 10*3/uL (ref 150–400)
RBC: 4.22 MIL/uL (ref 3.87–5.11)
RDW: 13 % (ref 11.5–15.5)
WBC: 11.2 10*3/uL — ABNORMAL HIGH (ref 4.0–10.5)
nRBC: 0 % (ref 0.0–0.2)

## 2020-06-11 MED ORDER — NAPROXEN 500 MG PO TABS: 500.0000 mg | ORAL_TABLET | Freq: Two times a day (BID) | ORAL | 0 refills | Status: DC

## 2020-06-11 NOTE — ED Triage Notes (Signed)
Pt in with c/o right thigh numbness and tingling that has been going on for a few months. Also c/o "lump" on her right thigh. Pt states it looks bigger than her left side when she looks in the mirror  Pt also c/o left shoulder pain that has been going on for a few weeks.   Denies any numbness/tingling in other extremities

## 2020-06-11 NOTE — Discharge Instructions (Addendum)
Use Naprosyn for pain.  Do not take additional NSAIDs including aspirin, ibuprofen/Advil, naproxen/Aleve with this medication as it can cause GI bleeding.  Wear loosefitting clothing.  Follow-up with your PCP about numbness and tingling for additional blood work as we discussed.  Follow-up with sports medicine for your shoulder.

## 2020-06-11 NOTE — ED Provider Notes (Signed)
[place]    CSN: [id number] Arrival date & time: 06/11/20  1758      History   Chief Complaint Chief Complaint  Patient presents with  . Numbness  . Shoulder Pain    HPI Amanda Cox is a 45 y.o. female.   Patient presents today for evaluation of several concerns.  She reports a several month history of intermittent right anterior thigh numbness.  Reports that symptoms were worse this morning when she first woke up and she had a burning/tingling sensation.  She does have a history of degenerative disc disease with some radiculopathy but states current symptoms are similar previous episodes.  She has been evaluated by PCP and has been to physical therapy which has improved but not resolved lower back pain.  She denies any tight clothing.  Denies history of diabetes, vitamin B12 deficiency, malignancy, previous chemotherapy, heavy metal exposure.  She denies any weakness, worsening pain, decreased range of motion.  In addition, patient reports a several month history of left shoulder pain.  She denies known injury or increase in activity prior to symptom onset.  She denies any associated chest pain or shortness of breath.  Reports decreased range of motion and increased pain with overhead flexion, internal rotation, external rotation.  Pain is rated 6 on a 0-10 pain scale, localized to anterior left shoulder without radiation, described as aching, no alleviating factors identified.  She has tried over-the-counter analgesics without symptom relief.  She is right-handed.  She denies previous injury or surgery.  She reports symptoms are interfering with her body perform daily activities and is difficult for her to get dressed in the morning as result of pain.     Past Medical History:  Diagnosis Date  . Chronic insomnia 11/08/2016  . Dyspareunia   . Essential hypertension, benign   . Genital herpes, unspecified   . Headache syndrome 11/08/2016  . Irregular menstrual  cycle   . Surveillance of previously prescribed contraceptive pill     Patient Active Problem List   Diagnosis Date Noted  . Routine general medical examination at a health care facility 08/02/2018  . Constipation 04/11/2018  . Chronic insomnia 11/08/2016  . Snoring 08/19/2013  . Obesity, unspecified 08/19/2013  . Essential hypertension, benign 07/10/2013  . Genital herpes 07/17/2012    Past Surgical History:  Procedure Laterality Date  . BREAST REDUCTION SURGERY    . CESAREAN SECTION      OB History    Gravida  2   Para  1   Term  1   Preterm      AB  1   Living  1     SAB      IAB  1   Ectopic      Multiple      Live Births  1            Home Medications    Prior to Admission medications   Medication Sig Start Date End Date Taking? Authorizing Provider  naproxen (NAPROSYN) 500 MG tablet Take 1 tablet (500 mg total) by mouth 2 (two) times daily. 06/11/20  Yes Franki Alcaide K, PA-C  fluconazole (DIFLUCAN) 150 MG tablet TAKE 1 TABLET(150 MG) BY MOUTH 1 TIME FOR 1 DOSE 06/01/20   Constant, [name], MD  fluticasone (FLONASE) 50 MCG/ACT nasal spray Place 1 spray into both nostrils 2 (two) times daily. 10/29/19   Volney American, PA-C  hydrochlorothiazide (HYDRODIURIL) 25 MG tablet Take 1 tablet (25 mg  total) by mouth daily. 07/22/18   [name], MD  metroNIDAZOLE (METROGEL VAGINAL) 0.75 % vaginal gel Place 1 Applicatorful vaginally 2 (two) times daily. 07/04/19   [name], MD  potassium chloride SA (KLOR-CON) 20 MEQ tablet Take 1 tablet (20 mEq total) by mouth 3 (three) times daily for 5 days. 05/11/19 05/16/19  Wieters, Hallie C, PA-C  TRI-LO-SPRINTEC 0.18/0.215/0.25 MG-25 MCG tab TAKE 1 TABLET BY MOUTH EVERY DAY 04/26/20   [name], MD  valACYclovir (VALTREX) 500 MG tablet Take 1 tablet (500 mg total) by mouth 2 (two) times daily. Take for 3 days prn each occurrence. 07/04/19   [name], MD    Family History Family History   Problem Relation Age of Onset  . Diabetes Mother   . Hyperlipidemia Mother   . Arthritis Mother   . Diabetes Father   . Diabetes Sister   . Arthritis Paternal Grandmother   . Hypertension Paternal Grandmother   . Congenital heart disease Other   . Colon cancer Neg Hx   . Rectal cancer Neg Hx   . Esophageal cancer Neg Hx   . Stomach cancer Neg Hx   . Liver cancer Neg Hx     Social History Social History   Tobacco Use  . Smoking status: Never Smoker  . Smokeless tobacco: Never Used  Vaping Use  . Vaping Use: Never used  Substance Use Topics  . Alcohol use: Yes    Alcohol/week: 0.0 standard drinks    Comment: rare- once every 3 months  . Drug use: No     Allergies   Sulfa antibiotics   Review of Systems Review of Systems  Constitutional: Positive for activity change. Negative for appetite change and fatigue.  Respiratory: Negative for cough and shortness of breath.   Cardiovascular: Negative for chest pain, palpitations and leg swelling.  Gastrointestinal: Negative for abdominal pain, diarrhea, nausea and vomiting.  Musculoskeletal: Positive for arthralgias (left shoulder). Negative for myalgias.  Neurological: Positive for numbness (right anterior thigh). Negative for dizziness, weakness, light-headedness and headaches.     Physical Exam Triage Vital Signs ED Triage Vitals  Enc Vitals Group     BP 06/11/20 1820 135/85     Pulse Rate 06/11/20 1819 88     Resp 06/11/20 1819 19     Temp 06/11/20 1819 99.2 F (37.3 C)     Temp src --      SpO2 06/11/20 1819 97 %     Weight --      Height --      Head Circumference --      Peak Flow --      Pain Score 06/11/20 1817 6     Pain Loc --      Pain Edu? --      Excl. in [place]? --    No data found.  Updated Vital Signs BP 135/85   Pulse 88   Temp 99.2 F (37.3 C)   Resp 19   LMP 05/21/2020 (Approximate)   SpO2 97%   Visual Acuity Right Eye Distance:   Left Eye Distance:   Bilateral Distance:     Right Eye Near:   Left Eye Near:    Bilateral Near:     Physical Exam Vitals reviewed.  Constitutional:      General: She is awake. She is not in acute distress.    Appearance: Normal appearance. She is not ill-appearing.     Comments: Very pleasant female appears  stated age in no acute distress  HENT:     Head: Normocephalic and atraumatic.  Cardiovascular:     Rate and Rhythm: Normal rate and regular rhythm.     Heart sounds: No murmur heard.   Pulmonary:     Effort: Pulmonary effort is normal.     Breath sounds: Normal breath sounds. No wheezing, rhonchi or rales.     Comments: Clear to auscultation bilaterally Abdominal:     Tenderness: There is no abdominal tenderness.  Musculoskeletal:     Left shoulder: Tenderness present. No swelling or bony tenderness. Decreased range of motion. Decreased strength.     Right upper leg: No swelling, edema, deformity, tenderness or bony tenderness.     Comments: Left shoulder: No swelling or deformity noted.  Decreased range of motion with overhead flexion, internal rotation, external rotation.  Tenderness palpation over scapular spine.  Strength 4/5.  Negative and empty can.  Unable to form Apley scratch secondary to pain.  Right leg: No deformity noted.  No significant swelling or pain.  Strength 5/5 at hip and knee.    Psychiatric:        Behavior: Behavior is cooperative.      UC Treatments / Results  Labs (all labs ordered are listed, but only abnormal results are displayed) Labs Reviewed  CBC WITH DIFFERENTIAL/PLATELET  COMPREHENSIVE METABOLIC PANEL    EKG   Radiology DG Shoulder Left  Result Date: 06/11/2020 CLINICAL DATA:  Left shoulder pain for a few weeks EXAM: LEFT SHOULDER - 2+ VIEW COMPARISON:  Chest radiograph February 28, 2018. FINDINGS: There is no evidence of fracture or dislocation. There is no evidence of arthropathy or other focal bone abnormality. Soft tissues are unremarkable. IMPRESSION: Negative left  shoulder radiographs. Electronically Signed   By: Dahlia Bailiff MD   On: 06/11/2020 18:56    Procedures Procedures (including critical care time)  Medications Ordered in UC Medications - No data to display  Initial Impression / Assessment and Plan / UC Course  I have reviewed the triage vital signs and the nursing notes.  Pertinent labs & imaging results that were available during my care of the patient were reviewed by me and considered in my medical decision making (see chart for details).     X-ray of left shoulder is normal.  Concern for rotator cuff injury versus tendinopathy.  Will start Naprosyn 500 mg to help with inflammation and pain.  She was instructed not to take NSAIDs with this medication due to risk of GI bleeding.  We will have her follow-up with sports medicine for further evaluation and management.  Strict return precautions given to which patient expressed understanding.  Suspect symptoms are related to meralgia paresthetica given localized nature.  She was encouraged to wear loosefitting clothing.  Will obtain CBC and CMP but discussed that additional work-up including A1c and B12 would need to be arranged with her PCP.  Hopeful that NSAIDs will help manage symptoms but discussed potential utility of starting gabapentin by PCP if symptoms persist.  Strict return precautions given to which patient expressed understanding.  Final Clinical Impressions(s) / UC Diagnoses   Final diagnoses:  Chronic left shoulder pain  Decreased range of motion of left shoulder  Numbness and tingling  Paresthesia     Discharge Instructions     Use Naprosyn for pain.  Do not take additional NSAIDs including aspirin, ibuprofen/Advil, naproxen/Aleve with this medication as it can cause GI bleeding.  Wear loosefitting clothing.  Follow-up with  your PCP about numbness and tingling for additional blood work as we discussed.  Follow-up with sports medicine for your shoulder.    ED  Prescriptions    Medication Sig Dispense Auth. Provider   naproxen (NAPROSYN) 500 MG tablet Take 1 tablet (500 mg total) by mouth 2 (two) times daily. 20 tablet Cheick Suhr, Derry Skill, PA-C     PDMP not reviewed this encounter.   Terrilee Croak, PA-C 06/11/20 1909

## 2020-07-09 ENCOUNTER — Ambulatory Visit (INDEPENDENT_AMBULATORY_CARE_PROVIDER_SITE_OTHER): Payer: No Typology Code available for payment source | Admitting: Obstetrics

## 2020-07-09 ENCOUNTER — Other Ambulatory Visit: Payer: Self-pay

## 2020-07-09 ENCOUNTER — Encounter: Payer: Self-pay | Admitting: Obstetrics

## 2020-07-09 ENCOUNTER — Other Ambulatory Visit (HOSPITAL_COMMUNITY)
Admission: RE | Admit: 2020-07-09 | Discharge: 2020-07-09 | Disposition: A | Discharge status: Home / Self Care | Payer: No Typology Code available for payment source | Source: Ambulatory Visit | Attending: Obstetrics | Admitting: Obstetrics

## 2020-07-09 VITALS — BP 132/88 | HR 65 | Ht 63.0 in | Wt 210.0 lb

## 2020-07-09 DIAGNOSIS — Z01419 Encounter for gynecological examination (general) (routine) without abnormal findings: Secondary | ICD-10-CM | POA: Diagnosis present

## 2020-07-09 DIAGNOSIS — N760 Acute vaginitis: Secondary | ICD-10-CM

## 2020-07-09 DIAGNOSIS — Z113 Encounter for screening for infections with a predominantly sexual mode of transmission: Secondary | ICD-10-CM

## 2020-07-09 DIAGNOSIS — Z3041 Encounter for surveillance of contraceptive pills: Secondary | ICD-10-CM

## 2020-07-09 DIAGNOSIS — B3731 Acute candidiasis of vulva and vagina: Secondary | ICD-10-CM

## 2020-07-09 DIAGNOSIS — A6000 Herpesviral infection of urogenital system, unspecified: Secondary | ICD-10-CM

## 2020-07-09 DIAGNOSIS — B373 Candidiasis of vulva and vagina: Secondary | ICD-10-CM | POA: Diagnosis not present

## 2020-07-09 DIAGNOSIS — B9689 Other specified bacterial agents as the cause of diseases classified elsewhere: Secondary | ICD-10-CM

## 2020-07-09 DIAGNOSIS — N946 Dysmenorrhea, unspecified: Secondary | ICD-10-CM

## 2020-07-09 DIAGNOSIS — N898 Other specified noninflammatory disorders of vagina: Secondary | ICD-10-CM

## 2020-07-09 MED ORDER — VALACYCLOVIR HCL 500 MG PO TABS: 500.0000 mg | ORAL_TABLET | Freq: Two times a day (BID) | ORAL | 5 refills | Status: DC

## 2020-07-09 MED ORDER — NORGESTIM-ETH ESTRAD TRIPHASIC 0.18/0.215/0.25 MG-25 MCG PO TABS: 1.0000 | ORAL_TABLET | Freq: Every day | ORAL | 11 refills | Status: DC

## 2020-07-09 MED ORDER — FLUCONAZOLE 200 MG PO TABS: 200.0000 mg | ORAL_TABLET | ORAL | 2 refills | Status: DC

## 2020-07-09 MED ORDER — IBUPROFEN 800 MG PO TABS: 800.0000 mg | ORAL_TABLET | Freq: Three times a day (TID) | ORAL | 5 refills | Status: DC | PRN

## 2020-07-09 MED ORDER — METRONIDAZOLE 0.75 % VA GEL: 1.0000 | Freq: Two times a day (BID) | VAGINAL | 5 refills | Status: DC

## 2020-07-09 NOTE — Progress Notes (Signed)
Subjective:        Amanda Cox is a 45 y.o. female here for a routine exam.  Current complaints: Vaginal discharge.    Personal health questionnaire:  Is patient [name], have a family history of breast and/or ovarian cancer: no Is there a family history of uterine cancer diagnosed at age < 9, gastrointestinal cancer, urinary tract cancer, family member who is a Field seismologist syndrome-associated carrier: no Is the patient overweight and hypertensive, family history of diabetes, personal history of gestational diabetes, preeclampsia or PCOS: no Is patient over 38, have PCOS,  family history of premature CHD under age 32, diabetes, smoke, have hypertension or peripheral artery disease:  no At any time, has a partner hit, kicked or otherwise hurt or frightened you?: no Over the past 2 weeks, have you felt down, depressed or hopeless?: no Over the past 2 weeks, have you felt little interest or pleasure in doing things?:no   Gynecologic History Patient's last menstrual period was 06/17/2020. Contraception: OCP (estrogen/progesterone) Last Pap: 07-04-2019. Results were: normal Last mammogram: 11-18-2019. Results were: normal  Obstetric History OB History  Gravida Para Term Preterm AB Living  2 1 1   1 1   SAB IAB Ectopic Multiple Live Births    1     1    # Outcome Date GA Lbr Len/2nd Weight Sex Delivery Anes PTL Lv  2 Term 07/22/01 [redacted]w[redacted]d  7 lb (3.175 kg) F CS-LTranv EPI N LIV  1 IAB             Past Medical History:  Diagnosis Date  . Chronic insomnia 11/08/2016  . Dyspareunia   . Essential hypertension, benign   . Genital herpes, unspecified   . Headache syndrome 11/08/2016  . Irregular menstrual cycle   . Surveillance of previously prescribed contraceptive pill     Past Surgical History:  Procedure Laterality Date  . BREAST REDUCTION SURGERY    . CESAREAN SECTION       Current Outpatient Medications:  .  hydrochlorothiazide (HYDRODIURIL) 25 MG tablet, Take  1 tablet (25 mg total) by mouth daily., Disp: 30 tablet, Rfl: 11 .  naproxen (NAPROSYN) 500 MG tablet, Take 1 tablet (500 mg total) by mouth 2 (two) times daily., Disp: 20 tablet, Rfl: 0 .  valACYclovir (VALTREX) 500 MG tablet, Take 1 tablet (500 mg total) by mouth 2 (two) times daily. Take for 3 days prn each occurrence., Disp: 30 tablet, Rfl: 5 .  fluconazole (DIFLUCAN) 150 MG tablet, TAKE 1 TABLET(150 MG) BY MOUTH 1 TIME FOR 1 DOSE (Patient not taking: Reported on 07/09/2020), Disp: 1 tablet, Rfl: 0 .  fluticasone (FLONASE) 50 MCG/ACT nasal spray, Place 1 spray into both nostrils 2 (two) times daily. (Patient not taking: Reported on 07/09/2020), Disp: 16 g, Rfl: 2 .  metroNIDAZOLE (METROGEL VAGINAL) 0.75 % vaginal gel, Place 1 Applicatorful vaginally 2 (two) times daily. (Patient not taking: Reported on 07/09/2020), Disp: 70 g, Rfl: 5 .  Norgestimate-Ethinyl Estradiol Triphasic (TRI-LO-SPRINTEC) 0.18/0.215/0.25 MG-25 MCG tab, Take 1 tablet by mouth daily., Disp: 28 tablet, Rfl: 11 .  potassium chloride SA (KLOR-CON) 20 MEQ tablet, Take 1 tablet (20 mEq total) by mouth 3 (three) times daily for 5 days., Disp: 15 tablet, Rfl: 0 Allergies  Allergen Reactions  . Sulfa Antibiotics Shortness Of Breath    Social History   Tobacco Use  . Smoking status: Never Smoker  . Smokeless tobacco: Never Used  Substance Use Topics  . Alcohol use: Yes  Alcohol/week: 0.0 standard drinks    Comment: rare- once every 3 months    Family History  Problem Relation Age of Onset  . Diabetes Mother   . Hyperlipidemia Mother   . Arthritis Mother   . Diabetes Father   . Diabetes Sister   . Arthritis Paternal Grandmother   . Hypertension Paternal Grandmother   . Congenital heart disease Other   . Colon cancer Neg Hx   . Rectal cancer Neg Hx   . Esophageal cancer Neg Hx   . Stomach cancer Neg Hx   . Liver cancer Neg Hx       Review of Systems  Constitutional: negative for fatigue and weight  loss Respiratory: negative for cough and wheezing Cardiovascular: negative for chest pain, fatigue and palpitations Gastrointestinal: negative for abdominal pain and change in bowel habits Musculoskeletal:negative for myalgias Neurological: negative for gait problems and tremors Behavioral/Psych: negative for abusive relationship, depression Endocrine: negative for temperature intolerance    Genitourinary: positive for vaginal discharge.  negative for abnormal menstrual periods, genital lesions, hot flashes, sexual problems  Integument/breast: negative for breast lump, breast tenderness, nipple discharge and skin lesion(s)    Objective:       BP 132/88   Pulse 65   Ht 5\' 3"  (1.6 m)   Wt 210 lb (95.3 kg)   LMP 06/17/2020   BMI 37.20 kg/m  General:   alert and no distress  Skin:   no rash or abnormalities  Lungs:   clear to auscultation bilaterally  Heart:   regular rate and rhythm, S1, S2 normal, no murmur, click, rub or gallop  Breasts:   normal without suspicious masses, skin or nipple changes or axillary nodes  Abdomen:  normal findings: no organomegaly, soft, non-tender and no hernia  Pelvis:  External genitalia: normal general appearance Urinary system: urethral meatus normal and bladder without fullness, nontender Vaginal: normal without tenderness, induration or masses Cervix: normal appearance Adnexa: normal bimanual exam Uterus: anteverted and non-tender, normal size   Lab Review Urine pregnancy test Labs reviewed yes Radiologic studies reviewed yes  I have spent a total of 20 minutes of face-to-face time, excluding clinical staff time, reviewing notes and preparing to see patient, ordering tests and/or medications, and counseling the patient.  Assessment:     1. Encounter for gynecological examination with Papanicolaou smear of cervix Rx: - Cytology - PAP  2. Vaginal discharge Rx: - Cervicovaginal ancillary only( [facility])  3. BV (bacterial  vaginosis) Rx: - metroNIDAZOLE (METROGEL VAGINAL) 0.75 % vaginal gel; Place 1 Applicatorful vaginally 2 (two) times daily.  Dispense: 70 g; Refill: 5  4. Candida vaginitis Rx: - fluconazole (DIFLUCAN) 200 MG tablet; Take 1 tablet (200 mg total) by mouth every 3 (three) days.  Dispense: 3 tablet; Refill: 2  5. Screening for STD (sexually transmitted disease) Rx: - Hepatitis B surface antigen - Hepatitis C antibody - RPR - HIV Antibody (routine testing w rflx)  6. Dysmenorrhea Rx: - ibuprofen (ADVIL) 800 MG tablet; Take 1 tablet (800 mg total) by mouth every 8 (eight) hours as needed.  Dispense: 30 tablet; Refill: 5  7. Genital herpes simplex, unspecified site Rx: - valACYclovir (VALTREX) 500 MG tablet; Take 1 tablet (500 mg total) by mouth 2 (two) times daily. Take for 3 days prn each occurrence.  Dispense: 30 tablet; Refill: 5  8. Encounter for surveillance of contraceptive pills Rx: - Norgestimate-Ethinyl Estradiol Triphasic (ORTHO TRI-CYCLEN LO) 0.18/0.215/0.25 MG-25 MCG tab; Take 1 tablet by mouth daily.  Dispense: 28 tablet; Refill: 11     Plan:    Education reviewed: calcium supplements, depression evaluation, low fat, low cholesterol diet, safe sex/STD prevention, self breast exams and weight bearing exercise. Contraception: OCP (estrogen/progesterone). Follow up in: 1 year.   Meds ordered this encounter  Medications  . Norgestimate-Ethinyl Estradiol Triphasic (TRI-LO-SPRINTEC) 0.18/0.215/0.25 MG-25 MCG tab    Sig: Take 1 tablet by mouth daily.    Dispense:  28 tablet    Refill:  11    ZERO refills remain on this prescription. Your patient is requesting advance approval of refills for this medication to [facility] This Encounter  Procedures  . Hepatitis B surface antigen  . Hepatitis C antibody  . RPR  . HIV Antibody (routine testing w rflx)    [name], MD 07/09/2020 9:46 AM

## 2020-07-09 NOTE — Progress Notes (Signed)
Patent presents for Annual Exam.  LMP:06/17/20 cycles are monthly with moderate flow lasting x 6 days. Mammogram :11/18/19 WNL  Last pap: 07/04/19 WNL  STD Screening: Desires all including HIV. Pt made aware ins may not cover 100% still wants BV and yeast testing t work for [employer]. Family Hx of Breast Cancer: None Contraception: Pills  Needs RF's.  CC: None

## 2020-07-10 LAB — HEPATITIS B SURFACE ANTIGEN: Hepatitis B Surface Ag: NEGATIVE

## 2020-07-10 LAB — RPR: RPR Ser Ql: NONREACTIVE

## 2020-07-10 LAB — HEPATITIS C ANTIBODY: Hep C Virus Ab: <0.1 s/co ratio (ref 0.0–0.9)

## 2020-07-10 LAB — HIV ANTIBODY (ROUTINE TESTING W REFLEX): HIV Screen 4th Generation wRfx: NONREACTIVE

## 2020-07-12 ENCOUNTER — Telehealth: Payer: Self-pay

## 2020-07-12 LAB — CERVICOVAGINAL ANCILLARY ONLY
Bacterial Vaginitis (gardnerella): NEGATIVE
Candida Glabrata: NEGATIVE
Candida Vaginitis: NEGATIVE
Chlamydia: NEGATIVE
Comment: NEGATIVE
Comment: NEGATIVE
Comment: NEGATIVE
Comment: NEGATIVE
Comment: NEGATIVE
Comment: NORMAL
Neisseria Gonorrhea: NEGATIVE
Trichomonas: NEGATIVE

## 2020-07-12 LAB — CYTOLOGY - PAP
Adequacy: ABSENT
Comment: NEGATIVE
Diagnosis: NEGATIVE
High risk HPV: NEGATIVE

## 2020-07-12 NOTE — Telephone Encounter (Signed)
Return call to pt to make aware all recent labs are WNL  Pt voiced understanding.

## 2020-08-11 ENCOUNTER — Ambulatory Visit (HOSPITAL_COMMUNITY)
Admission: EM | Admit: 2020-08-11 | Discharge: 2020-08-11 | Disposition: A | Discharge status: Home / Self Care | Payer: No Typology Code available for payment source | Attending: Internal Medicine | Admitting: Internal Medicine

## 2020-08-11 ENCOUNTER — Encounter (HOSPITAL_COMMUNITY): Payer: Self-pay | Admitting: Emergency Medicine

## 2020-08-11 ENCOUNTER — Other Ambulatory Visit: Payer: Self-pay

## 2020-08-11 DIAGNOSIS — Z20822 Contact with and (suspected) exposure to covid-19: Secondary | ICD-10-CM | POA: Diagnosis not present

## 2020-08-11 NOTE — ED Triage Notes (Signed)
Patient  was around someone that tested positive for covid.  .  Denies symptoms

## 2020-08-11 NOTE — Discharge Instructions (Signed)

## 2020-08-12 LAB — SARS CORONAVIRUS 2 (TAT 6-24 HRS): SARS Coronavirus 2: NEGATIVE

## 2020-08-24 ENCOUNTER — Other Ambulatory Visit: Payer: Self-pay | Admitting: Obstetrics

## 2020-08-24 DIAGNOSIS — B9689 Other specified bacterial agents as the cause of diseases classified elsewhere: Secondary | ICD-10-CM

## 2020-08-24 DIAGNOSIS — N760 Acute vaginitis: Secondary | ICD-10-CM

## 2020-08-24 MED ORDER — METRONIDAZOLE 500 MG PO TABS: 500.0000 mg | ORAL_TABLET | Freq: Two times a day (BID) | ORAL | 2 refills | Status: DC

## 2020-09-07 ENCOUNTER — Other Ambulatory Visit: Payer: Self-pay | Admitting: *Deleted

## 2020-09-07 ENCOUNTER — Encounter: Payer: Self-pay | Admitting: *Deleted

## 2020-09-09 ENCOUNTER — Encounter: Payer: Self-pay | Admitting: *Deleted

## 2020-09-10 ENCOUNTER — Other Ambulatory Visit: Payer: Self-pay

## 2020-09-10 ENCOUNTER — Encounter: Payer: Self-pay | Admitting: Neurology

## 2020-09-10 ENCOUNTER — Ambulatory Visit (INDEPENDENT_AMBULATORY_CARE_PROVIDER_SITE_OTHER): Payer: No Typology Code available for payment source | Admitting: Neurology

## 2020-09-10 DIAGNOSIS — G5711 Meralgia paresthetica, right lower limb: Secondary | ICD-10-CM | POA: Diagnosis not present

## 2020-09-10 HISTORY — DX: Meralgia paresthetica, right lower limb: G57.11

## 2020-09-10 MED ORDER — GABAPENTIN 100 MG PO CAPS: 200.0000 mg | ORAL_CAPSULE | Freq: Every day | ORAL | 3 refills | Status: DC

## 2020-09-10 NOTE — Progress Notes (Signed)
Reason for visit: Right thigh discomfort  Referring physician: Dr. Star Amanda Cox is a 45 y.o. female  History of present illness:  [name] is a 45 year old right-handed black female with a history of headaches, seen through this office in 2018.  The patient comes in with a new problem at this point.  Over the last several months she has had intermittent discomfort in the right anterolateral thigh from the hip level down to the knee.  This is not present at all times, it generally comes on at night when she rolls over on her right side.  She will notice a tingling sensation that is superficial on the right thigh.  During the daytime, she generally is not bothered with this.  She may have this problem about 2 times a week.  She is trying to avoid rolling on her right side to prevent the pain.  She has had about a 20 pound weight gain over the last 6 months or so.  She is prediabetic with a hemoglobin A1c of 5.9.  She is now on a low carbohydrate diet trying to lose weight.  She has been seen through Dr. Nelva Bush for low back pain, she has been found to have significant facet joint arthritis at the L4-5 level, she has gotten facet joint injections with some benefit.  She has pain across the back that does not go down the leg.  The patient reports no weakness of the lower extremities, she denies any neck pain or pain down the arms.  She does have some left shoulder discomfort and decreased mobility of the left shoulder.  She also reports some problems with hair loss.  She comes to this office for an evaluation.  Past Medical History:  Diagnosis Date   Chronic insomnia 11/08/2016   Dyspareunia    Essential hypertension, benign    Genital herpes, unspecified    Headache syndrome 11/08/2016   Irregular menstrual cycle    Lumbar spondylosis    Neuralgia and neuritis, unspecified    Surveillance of previously prescribed contraceptive pill     Past Surgical History:  Procedure  Laterality Date   BREAST REDUCTION SURGERY  2005   CESAREAN SECTION  2003    Family History  Problem Relation Amanda of Onset   Hypertension Mother    Diabetes Mother    Hyperlipidemia Mother    Arthritis Mother    Hypertension Father    Diabetes Father    Arthritis Sister    Diabetes Sister    Arthritis Maternal Grandmother    Arthritis Paternal Grandmother    Hypertension Paternal Grandmother    Congenital heart disease Other    Colon cancer Neg Hx    Rectal cancer Neg Hx    Esophageal cancer Neg Hx    Stomach cancer Neg Hx    Liver cancer Neg Hx     Social history:  reports that she has never smoked. She has never used smokeless tobacco. She reports current alcohol use. She reports that she does not use drugs.  Medications:  Prior to Admission medications   Medication Sig Start Date End Date Taking? Authorizing Provider  fluconazole (DIFLUCAN) 150 MG tablet TAKE 1 TABLET(150 MG) BY MOUTH 1 TIME FOR 1 DOSE Patient not taking: Reported on 07/09/2020 06/01/20   Constant, [name], MD  fluconazole (DIFLUCAN) 200 MG tablet Take 1 tablet (200 mg total) by mouth every 3 (three) days. 07/09/20   [name], MD  fluticasone (FLONASE) 50  MCG/ACT nasal spray Place 1 spray into both nostrils 2 (two) times daily. Patient not taking: Reported on 07/09/2020 10/29/19   Volney American, PA-C  hydrochlorothiazide (HYDRODIURIL) 25 MG tablet Take 1 tablet (25 mg total) by mouth daily. 07/22/18   [name], MD  ibuprofen (ADVIL) 800 MG tablet Take 1 tablet (800 mg total) by mouth every 8 (eight) hours as needed. 07/09/20   [name], MD  metroNIDAZOLE (FLAGYL) 500 MG tablet Take 1 tablet (500 mg total) by mouth 2 (two) times daily. 08/24/20   [name], MD  naproxen (NAPROSYN) 500 MG tablet Take 1 tablet (500 mg total) by mouth 2 (two) times daily. Patient not taking: Reported on 09/10/2020 06/11/20   Raspet, Derry Skill, PA-C  norgestimate-ethinyl estradiol (ORTHO-CYCLEN)  0.25-35 MG-MCG tablet Take 1 tablet by mouth daily.    [provider]  valACYclovir (VALTREX) 500 MG tablet Take 1 tablet (500 mg total) by mouth 2 (two) times daily. Take for 3 days prn each occurrence. 07/09/20   [name], MD      Allergies  Allergen Reactions   Sulfa Antibiotics Shortness Of Breath    ROS:  Out of a complete 14 system review of symptoms, the patient complains only of the following symptoms, and all other reviewed systems are negative.  Right thigh discomfort Low back pain Hair loss  Blood pressure 132/84, pulse 88, height '5\' 3"'$  (1.6 m), weight 219 lb 8 oz (99.6 kg), SpO2 98 %.  Physical Exam  General: The patient is alert and cooperative at the time of the examination.  The patient is moderately obese.  Eyes: Pupils are equal, round, and reactive to light. Discs are flat bilaterally.  Neck: The neck is supple, no carotid bruits are noted.  Respiratory: The respiratory examination is clear.  Cardiovascular: The cardiovascular examination reveals a regular rate and rhythm, no obvious murmurs or rubs are noted.  Skin: Extremities are without significant edema.  Neurologic Exam  Mental status: The patient is alert and oriented x 3 at the time of the examination. The patient has apparent normal recent and remote memory, with an apparently normal attention span and concentration ability.  Cranial nerves: Facial symmetry is present. There is good sensation of the face to pinprick and soft touch bilaterally. The strength of the facial muscles and the muscles to head turning and shoulder shrug are normal bilaterally. Speech is well enunciated, no aphasia or dysarthria is noted. Extraocular movements are full. Visual fields are full. The tongue is midline, and the patient has symmetric elevation of the soft palate. No obvious hearing deficits are noted.  Motor: The motor testing reveals 5 over 5 strength of all 4 extremities. Good symmetric motor  tone is noted throughout.  Sensory: Sensory testing is intact to pinprick, soft touch, vibration sensation, and position sense on all 4 extremities. No evidence of extinction is noted.  Coordination: Cerebellar testing reveals good finger-nose-finger and heel-to-shin bilaterally.  Gait and station: Gait is normal. Tandem gait is normal. Romberg is negative. No drift is seen.  Reflexes: Deep tendon reflexes are symmetric and normal bilaterally.  The ankle jerk reflexes are well-maintained bilaterally.  Toes are downgoing bilaterally.   MRI lumbar 05/29/19:  Impression: 1.  Mild L4-5 bulging disc and minimal anterolisthesis of L4 on L5 due to severe bilateral facet arthrosis without spinal canal stenosis or neuroforaminal narrowing. 2.  Mild L3-4 bulging disc asymmetric to the right without narrowing. 3.  Moderate left L5-S1  facet arthrosis without neuroforaminal narrowing. 4.  Degenerative minimal anterolisthesis of L4 on L5.   Assessment/Plan:  1.  Right meralgia paresthetica  The patient has intermittent symptoms currently in the right anterolateral thigh consistent with meralgia paresthetica.  This likely is related to weight gain recently, she also has borderline diabetes.  She is already on a low carbohydrate diet trying to lose weight which should ameliorate her symptoms.  The patient will be given some gabapentin taking 200 mg in the evening to prevent the thigh pain.  She will follow-up here in 4 months, if the symptoms resolved with weight loss, she can cancel the appointment.  Jill Alexanders MD 09/10/2020 8:21 AM  [facility] [address] [place], [place] [address]  Phone [phone] Fax [phone]

## 2020-09-21 ENCOUNTER — Encounter (HOSPITAL_COMMUNITY): Payer: Self-pay | Admitting: Emergency Medicine

## 2020-09-21 ENCOUNTER — Ambulatory Visit (HOSPITAL_COMMUNITY)
Admission: EM | Admit: 2020-09-21 | Discharge: 2020-09-21 | Disposition: A | Discharge status: Home / Self Care | Payer: No Typology Code available for payment source | Attending: Emergency Medicine | Admitting: Emergency Medicine

## 2020-09-21 DIAGNOSIS — N760 Acute vaginitis: Secondary | ICD-10-CM | POA: Diagnosis not present

## 2020-09-21 LAB — POC URINE PREG, ED: Preg Test, Ur: NEGATIVE

## 2020-09-21 LAB — POCT URINALYSIS DIPSTICK, ED / UC
Bilirubin Urine: NEGATIVE
Glucose, UA: NEGATIVE mg/dL
Ketones, ur: 15 mg/dL — AB
Leukocytes,Ua: NEGATIVE
Nitrite: NEGATIVE
Protein, ur: NEGATIVE mg/dL
Specific Gravity, Urine: 1.025 (ref 1.005–1.030)
Urobilinogen, UA: 0.2 mg/dL (ref 0.0–1.0)
pH: 6 (ref 5.0–8.0)

## 2020-09-21 NOTE — Discharge Instructions (Addendum)
We will contact you if the results from your lab work are positive and require additional treatment.    Drink plenty of fluids, especially water.   Do not have sex while taking undergoing treatment for STI.  Make sure that all of your partners get tested and treated.   Use a condom or other barrier method for all sexual encounters.    Return or go to the Emergency Department if symptoms worsen or do not improve in the next few days.

## 2020-09-21 NOTE — ED Notes (Signed)
When educating on tests being ordered and how to collect, patient reported that with her symptoms, she was concerned for a tampon/foreign body in vagina.  Notified Vickki Muff, NP.    Patient instructed to undress, equipment at bedside

## 2020-09-21 NOTE — ED Notes (Signed)
Patient in bathroom

## 2020-09-21 NOTE — ED Triage Notes (Signed)
Pt presents with abdominal pain, vaginal odor, irritation, and clear discharge xs 2-3 weeks. States completed a course of Flagyl approx 2 weeks ago.

## 2020-09-21 NOTE — ED Provider Notes (Signed)
[facility]    CSN: [id number] Arrival date & time: 09/21/20  1735      History   Chief Complaint Chief Complaint  Patient presents with   Abdominal Pain   Vaginal Discharge    HPI Amanda Cox is a 45 y.o. female.   Patient here for evaluation of vaginal odor, irritation recently measured, and lower abdominal pain that has been ongoing for the past 2 to 3 weeks.  Reports recently completed Flagyl for BV.  Concerned about possible retained tampon.  Denies any trauma, injury, or other precipitating event.  Denies any specific alleviating or aggravating factors.  Denies any fevers, chest pain, shortness of breath, N/V/D, numbness, tingling, weakness, abdominal pain, or headaches.     The history is provided by the patient.  Abdominal Pain Associated symptoms: vaginal discharge   Vaginal Discharge Associated symptoms: abdominal pain    Past Medical History:  Diagnosis Date   Chronic insomnia 11/08/2016   Dyspareunia    Essential hypertension, benign    Genital herpes, unspecified    Headache syndrome 11/08/2016   Irregular menstrual cycle    Lumbar spondylosis    Meralgia paraesthetica, right 09/10/2020   Neuralgia and neuritis, unspecified    Surveillance of previously prescribed contraceptive pill     Patient Active Problem List   Diagnosis Date Noted   Meralgia paraesthetica, right 09/10/2020   Routine general medical examination at a health care facility 08/02/2018   Constipation 04/11/2018   Chronic insomnia 11/08/2016   Snoring 08/19/2013   Obesity, unspecified 08/19/2013   Essential hypertension, benign 07/10/2013   Genital herpes 07/17/2012    Past Surgical History:  Procedure Laterality Date   BREAST REDUCTION SURGERY  2005   CESAREAN SECTION  2003    OB History     Gravida  2   Para  1   Term  1   Preterm      AB  1   Living  1      SAB      IAB  1   Ectopic      Multiple      Live Births  1             Home Medications    Prior to Admission medications   Medication Sig Start Date End Date Taking? Authorizing Provider  hydrochlorothiazide (HYDRODIURIL) 25 MG tablet Take 1 tablet (25 mg total) by mouth daily. 07/22/18  Yes [name], MD  Phentermine HCl 8 MG TABS Take 2 tablets (16 mg) daily at 11 am-12 pm.  After two weeks, may add 1/2 tablet (4 mg) at 4pm 09/10/20  Yes [provider]  fluconazole (DIFLUCAN) 150 MG tablet TAKE 1 TABLET(150 MG) BY MOUTH 1 TIME FOR 1 DOSE Patient not taking: Reported on 07/09/2020 06/01/20   Constant, [name], MD  fluconazole (DIFLUCAN) 200 MG tablet Take 1 tablet (200 mg total) by mouth every 3 (three) days. 07/09/20   [name], MD  fluticasone (FLONASE) 50 MCG/ACT nasal spray Place 1 spray into both nostrils 2 (two) times daily. Patient not taking: Reported on 07/09/2020 10/29/19   Volney American, PA-C  gabapentin (NEURONTIN) 100 MG capsule Take 2 capsules (200 mg total) by mouth at bedtime. 09/10/20   Kathrynn Ducking, MD  ibuprofen (ADVIL) 800 MG tablet Take 1 tablet (800 mg total) by mouth every 8 (eight) hours as needed. 07/09/20   [name], MD  metroNIDAZOLE (FLAGYL) 500 MG tablet Take  1 tablet (500 mg total) by mouth 2 (two) times daily. 08/24/20   [name], MD  naproxen (NAPROSYN) 500 MG tablet Take 1 tablet (500 mg total) by mouth 2 (two) times daily. Patient not taking: Reported on 09/10/2020 06/11/20   Raspet, Derry Skill, PA-C  norgestimate-ethinyl estradiol (ORTHO-CYCLEN) 0.25-35 MG-MCG tablet Take 1 tablet by mouth daily.    [provider]  valACYclovir (VALTREX) 500 MG tablet Take 1 tablet (500 mg total) by mouth 2 (two) times daily. Take for 3 days prn each occurrence. 07/09/20   [name], MD    Family History Family History  Problem Relation Age of Onset   Hypertension Mother    Diabetes Mother    Hyperlipidemia Mother    Arthritis Mother    Hypertension Father    Diabetes Father     Arthritis Sister    Diabetes Sister    Arthritis Maternal Grandmother    Arthritis Paternal Grandmother    Hypertension Paternal Grandmother    Congenital heart disease Other    Colon cancer Neg Hx    Rectal cancer Neg Hx    Esophageal cancer Neg Hx    Stomach cancer Neg Hx    Liver cancer Neg Hx     Social History Social History   Tobacco Use   Smoking status: Never   Smokeless tobacco: Never  Vaping Use   Vaping Use: Never used  Substance Use Topics   Alcohol use: Yes    Alcohol/week: 0.0 standard drinks    Comment: rare- once every 3 months   Drug use: No     Allergies   Sulfa antibiotics   Review of Systems Review of Systems  Gastrointestinal:  Positive for abdominal pain.  Genitourinary:  Positive for vaginal discharge.    Physical Exam Triage Vital Signs ED Triage Vitals  Enc Vitals Group     BP 09/21/20 1818 131/88     Pulse Rate 09/21/20 1818 97     Resp 09/21/20 1818 17     Temp 09/21/20 1818 98.4 F (36.9 C)     Temp Source 09/21/20 1818 Oral     SpO2 09/21/20 1818 97 %     Weight --      Height --      Head Circumference --      Peak Flow --      Pain Score 09/21/20 1814 6     Pain Loc --      Pain Edu? --      Excl. in [place]? --    No data found.  Updated Vital Signs BP 131/88 (BP Location: Right Arm)   Pulse 97   Temp 98.4 F (36.9 C) (Oral)   Resp 17   LMP 09/02/2020   SpO2 97%   Visual Acuity Right Eye Distance:   Left Eye Distance:   Bilateral Distance:    Right Eye Near:   Left Eye Near:    Bilateral Near:     Physical Exam Vitals and nursing note reviewed.  Constitutional:      General: She is not in acute distress.    Appearance: Normal appearance. She is not ill-appearing, toxic-appearing or diaphoretic.  HENT:     Head: Normocephalic and atraumatic.  Eyes:     Conjunctiva/sclera: Conjunctivae normal.  Cardiovascular:     Rate and Rhythm: Normal rate.     Pulses: Normal pulses.  Pulmonary:     Effort:  Pulmonary effort is normal.  Abdominal:  General: Abdomen is flat. Bowel sounds are normal.     Tenderness: There is no abdominal tenderness.  Genitourinary:    General: Normal vulva.     Vagina: Normal.     Cervix: Normal.     Comments: Patient declined chaperone No retained objects or other foreign bodies  Musculoskeletal:        General: Normal range of motion.     Cervical back: Normal range of motion.  Skin:    General: Skin is warm and dry.  Neurological:     General: No focal deficit present.     Mental Status: She is alert and oriented to person, place, and time.  Psychiatric:        Mood and Affect: Mood normal.     UC Treatments / Results  Labs (all labs ordered are listed, but only abnormal results are displayed) Labs Reviewed  POCT URINALYSIS DIPSTICK, ED / UC - Abnormal; Notable for the following components:      Result Value   Ketones, ur 15 (*)    Hgb urine dipstick TRACE (*)    All other components within normal limits  POC URINE PREG, ED  CERVICOVAGINAL ANCILLARY ONLY    EKG   Radiology No results found.  Procedures Procedures (including critical care time)  Medications Ordered in UC Medications - No data to display  Initial Impression / Assessment and Plan / UC Course  I have reviewed the triage vital signs and the nursing notes.  Pertinent labs & imaging results that were available during my care of the patient were reviewed by me and considered in my medical decision making (see chart for details).    Assessment negative for red flags or concerns.  Urinalysis positive for hgb and ketones but no signs of infection.  Cervicovaginal swab obtained and will treat based on results.  Encouraged fluids and rest.  Encouraged patient to follow up with GYN for any ongoing lower abdominal pain.   Final Clinical Impressions(s) / UC Diagnoses   Final diagnoses:  Acute vaginitis     Discharge Instructions      We will contact you if the results  from your lab work are positive and require additional treatment.    Drink plenty of fluids, especially water.   Do not have sex while taking undergoing treatment for STI.  Make sure that all of your partners get tested and treated.   Use a condom or other barrier method for all sexual encounters.    Return or go to the Emergency Department if symptoms worsen or do not improve in the next few days.      ED Prescriptions   None    PDMP not reviewed this encounter.   Pearson Forster, NP 09/21/20 1919

## 2020-09-22 LAB — CERVICOVAGINAL ANCILLARY ONLY
Bacterial Vaginitis (gardnerella): NEGATIVE
Candida Glabrata: NEGATIVE
Candida Vaginitis: NEGATIVE
Chlamydia: NEGATIVE
Comment: NEGATIVE
Comment: NEGATIVE
Comment: NEGATIVE
Comment: NEGATIVE
Comment: NEGATIVE
Comment: NORMAL
Neisseria Gonorrhea: NEGATIVE
Trichomonas: NEGATIVE

## 2020-09-29 ENCOUNTER — Other Ambulatory Visit: Payer: Self-pay | Admitting: Physician Assistant

## 2020-09-29 DIAGNOSIS — Z1231 Encounter for screening mammogram for malignant neoplasm of breast: Secondary | ICD-10-CM

## 2020-10-29 ENCOUNTER — Other Ambulatory Visit (HOSPITAL_COMMUNITY)
Admission: RE | Admit: 2020-10-29 | Discharge: 2020-10-29 | Disposition: A | Discharge status: Home / Self Care | Payer: No Typology Code available for payment source | Source: Ambulatory Visit | Attending: Obstetrics | Admitting: Obstetrics

## 2020-10-29 ENCOUNTER — Other Ambulatory Visit: Payer: Self-pay

## 2020-10-29 ENCOUNTER — Ambulatory Visit (INDEPENDENT_AMBULATORY_CARE_PROVIDER_SITE_OTHER): Payer: No Typology Code available for payment source | Admitting: Obstetrics

## 2020-10-29 ENCOUNTER — Encounter: Payer: Self-pay | Admitting: Obstetrics

## 2020-10-29 VITALS — BP 146/96 | HR 78 | Wt 216.0 lb

## 2020-10-29 DIAGNOSIS — E669 Obesity, unspecified: Secondary | ICD-10-CM

## 2020-10-29 DIAGNOSIS — N898 Other specified noninflammatory disorders of vagina: Secondary | ICD-10-CM

## 2020-10-29 DIAGNOSIS — B9689 Other specified bacterial agents as the cause of diseases classified elsewhere: Secondary | ICD-10-CM

## 2020-10-29 DIAGNOSIS — Z3041 Encounter for surveillance of contraceptive pills: Secondary | ICD-10-CM

## 2020-10-29 DIAGNOSIS — N760 Acute vaginitis: Secondary | ICD-10-CM

## 2020-10-29 DIAGNOSIS — I10 Essential (primary) hypertension: Secondary | ICD-10-CM

## 2020-10-29 DIAGNOSIS — L0232 Furuncle of buttock: Secondary | ICD-10-CM

## 2020-10-29 MED ORDER — METRONIDAZOLE 0.75 % VA GEL: 1.0000 | Freq: Two times a day (BID) | VAGINAL | 5 refills | Status: DC

## 2020-10-29 MED ORDER — CLINDAMYCIN HCL 300 MG PO CAPS: 300.0000 mg | ORAL_CAPSULE | Freq: Three times a day (TID) | ORAL | 0 refills | Status: DC

## 2020-10-29 NOTE — Progress Notes (Signed)
Patient ID: Amanda Cox, female   DOB: 01/02/1976, 45 y.o.   MRN: [id number]  Chief Complaint  Patient presents with   vaginal bump    HPI Amanda Cox is a 45 y.o. female.  Complains of "bump" in inner buttock / thigh area, and malodorous vaginal discharge HPI  Past Medical History:  Diagnosis Date   Chronic insomnia 11/08/2016   Dyspareunia    Essential hypertension, benign    Genital herpes, unspecified    Headache syndrome 11/08/2016   Irregular menstrual cycle    Lumbar spondylosis    Meralgia paraesthetica, right 09/10/2020   Neuralgia and neuritis, unspecified    Surveillance of previously prescribed contraceptive pill     Past Surgical History:  Procedure Laterality Date   BREAST REDUCTION SURGERY  2005   CESAREAN SECTION  2003    Family History  Problem Relation Age of Onset   Hypertension Mother    Diabetes Mother    Hyperlipidemia Mother    Arthritis Mother    Hypertension Father    Diabetes Father    Arthritis Sister    Diabetes Sister    Arthritis Maternal Grandmother    Arthritis Paternal Grandmother    Hypertension Paternal Grandmother    Congenital heart disease Other    Colon cancer Neg Hx    Rectal cancer Neg Hx    Esophageal cancer Neg Hx    Stomach cancer Neg Hx    Liver cancer Neg Hx     Social History Social History   Tobacco Use   Smoking status: Never   Smokeless tobacco: Never  Vaping Use   Vaping Use: Never used  Substance Use Topics   Alcohol use: Yes    Alcohol/week: 0.0 standard drinks    Comment: rare- once every 3 months   Drug use: No    Allergies  Allergen Reactions   Sulfa Antibiotics Shortness Of Breath    Current Outpatient Medications  Medication Sig Dispense Refill   clindamycin (CLEOCIN) 300 MG capsule Take 1 capsule (300 mg total) by mouth 3 (three) times daily. 21 capsule 0   gabapentin (NEURONTIN) 100 MG capsule Take 2 capsules (200 mg total) by mouth at bedtime. 60 capsule 3    hydrochlorothiazide (HYDRODIURIL) 25 MG tablet Take 1 tablet (25 mg total) by mouth daily. 30 tablet 11   ibuprofen (ADVIL) 800 MG tablet Take 1 tablet (800 mg total) by mouth every 8 (eight) hours as needed. 30 tablet 5   metroNIDAZOLE (METROGEL VAGINAL) 0.75 % vaginal gel Place 1 Applicatorful vaginally 2 (two) times daily. 70 g 5   norgestimate-ethinyl estradiol (ORTHO-CYCLEN) 0.25-35 MG-MCG tablet Take 1 tablet by mouth daily.     Phentermine HCl 8 MG TABS Take 2 tablets (16 mg) daily at 11 am-12 pm.  After two weeks, may add 1/2 tablet (4 mg) at 4pm     valACYclovir (VALTREX) 500 MG tablet Take 1 tablet (500 mg total) by mouth 2 (two) times daily. Take for 3 days prn each occurrence. 30 tablet 5   fluconazole (DIFLUCAN) 150 MG tablet TAKE 1 TABLET(150 MG) BY MOUTH 1 TIME FOR 1 DOSE (Patient not taking: No sig reported) 1 tablet 0   fluconazole (DIFLUCAN) 200 MG tablet Take 1 tablet (200 mg total) by mouth every 3 (three) days. (Patient not taking: Reported on 10/29/2020) 3 tablet 2   fluticasone (FLONASE) 50 MCG/ACT nasal spray Place 1 spray into both nostrils 2 (two) times daily. (Patient not taking: No sig reported)  16 g 2   naproxen (NAPROSYN) 500 MG tablet Take 1 tablet (500 mg total) by mouth 2 (two) times daily. (Patient not taking: No sig reported) 20 tablet 0   No current facility-administered medications for this visit.    Review of Systems Review of Systems Constitutional: negative for fatigue and weight loss Respiratory: negative for cough and wheezing Cardiovascular: negative for chest pain, fatigue and palpitations Gastrointestinal: negative for abdominal pain and change in bowel habits Genitourinary: positive for vaginal discharge and bump in buttockl area Integument/breast: negative for nipple discharge Musculoskeletal:negative for myalgias Neurological: negative for gait problems and tremors Behavioral/Psych: negative for abusive relationship, depression Endocrine:  negative for temperature intolerance      Blood pressure (!) 146/96, pulse 78, weight 216 lb (98 kg).  Physical Exam Physical Exam General:   Alert and no distress  Skin:   no rash or abnormalities  Lungs:   clear to auscultation bilaterally  Heart:   regular rate and rhythm, S1, S2 normal, no murmur, click, rub or gallop  Breasts:   normal without suspicious masses, skin or nipple changes or axillary nodes  Abdomen:  normal findings: no organomegaly, soft, non-tender and no hernia  Pelvis:  External genitalia: normal general appearance Urinary system: urethral meatus normal and bladder without fullness, nontender Vaginal: normal without tenderness, induration or masses Cervix: normal appearance Adnexa: normal bimanual exam Uterus: anteverted and non-tender, normal size   I have spent a total of 20 minutes of face-to-face time, excluding clinical staff time, reviewing notes and preparing to see patient, ordering tests and/or medications, and counseling the patient.   Data Reviewed Wet Prep  Assessment     1. Vaginal discharge Rx: - Cervicovaginal ancillary only  2. Boil of Buttock Rx: - clindamycin (CLEOCIN) 300 MG capsule; Take 1 capsule (300 mg total) by mouth 3 (three) times daily.  Dispense: 21 capsule; Refill: 0  3. BV (bacterial vaginosis) Rx: - metroNIDAZOLE (METROGEL VAGINAL) 0.75 % vaginal gel; Place 1 Applicatorful vaginally 2 (two) times daily.  Dispense: 70 g; Refill: 5  4. Obesity (BMI 35.0-39.9 without comorbidity) - weight reduction with the aid of dietary changes, exercise and behavioral modification recommended     Plan   Follow up in 1 year   Meds ordered this encounter  Medications   metroNIDAZOLE (METROGEL VAGINAL) 0.75 % vaginal gel    Sig: Place 1 Applicatorful vaginally 2 (two) times daily.    Dispense:  70 g    Refill:  5   clindamycin (CLEOCIN) 300 MG capsule    Sig: Take 1 capsule (300 mg total) by mouth 3 (three) times daily.     Dispense:  21 capsule    Refill:  0   [name], MD 10/29/2020 11:45 AM

## 2020-10-29 NOTE — Progress Notes (Signed)
Bump on vaginal area for a couple of days.

## 2020-11-01 LAB — CERVICOVAGINAL ANCILLARY ONLY
Bacterial Vaginitis (gardnerella): NEGATIVE
Candida Glabrata: NEGATIVE
Candida Vaginitis: NEGATIVE
Chlamydia: NEGATIVE
Comment: NEGATIVE
Comment: NEGATIVE
Comment: NEGATIVE
Comment: NEGATIVE
Comment: NEGATIVE
Comment: NORMAL
Neisseria Gonorrhea: NEGATIVE
Trichomonas: NEGATIVE

## 2020-11-17 ENCOUNTER — Ambulatory Visit: Payer: No Typology Code available for payment source | Admitting: Neurology

## 2020-11-19 ENCOUNTER — Other Ambulatory Visit: Payer: Self-pay

## 2020-11-19 ENCOUNTER — Ambulatory Visit
Admission: RE | Admit: 2020-11-19 | Discharge: 2020-11-19 | Disposition: A | Discharge status: Home / Self Care | Payer: No Typology Code available for payment source | Source: Ambulatory Visit | Attending: Physician Assistant | Admitting: Physician Assistant

## 2020-11-19 DIAGNOSIS — Z1231 Encounter for screening mammogram for malignant neoplasm of breast: Secondary | ICD-10-CM

## 2020-11-24 ENCOUNTER — Ambulatory Visit: Payer: No Typology Code available for payment source

## 2020-12-08 ENCOUNTER — Encounter (HOSPITAL_COMMUNITY): Payer: Self-pay | Admitting: Emergency Medicine

## 2020-12-08 ENCOUNTER — Emergency Department (HOSPITAL_COMMUNITY)
Admission: EM | Admit: 2020-12-08 | Discharge: 2020-12-08 | Disposition: A | Discharge status: Home / Self Care | Payer: No Typology Code available for payment source | Attending: Emergency Medicine | Admitting: Emergency Medicine

## 2020-12-08 ENCOUNTER — Emergency Department (HOSPITAL_COMMUNITY): Payer: No Typology Code available for payment source

## 2020-12-08 DIAGNOSIS — N9489 Other specified conditions associated with female genital organs and menstrual cycle: Secondary | ICD-10-CM | POA: Insufficient documentation

## 2020-12-08 DIAGNOSIS — R000 Tachycardia, unspecified: Secondary | ICD-10-CM | POA: Diagnosis not present

## 2020-12-08 DIAGNOSIS — I10 Essential (primary) hypertension: Secondary | ICD-10-CM | POA: Diagnosis not present

## 2020-12-08 DIAGNOSIS — U071 COVID-19: Secondary | ICD-10-CM | POA: Insufficient documentation

## 2020-12-08 DIAGNOSIS — Z79899 Other long term (current) drug therapy: Secondary | ICD-10-CM | POA: Diagnosis not present

## 2020-12-08 DIAGNOSIS — R0981 Nasal congestion: Secondary | ICD-10-CM | POA: Insufficient documentation

## 2020-12-08 DIAGNOSIS — R059 Cough, unspecified: Secondary | ICD-10-CM | POA: Diagnosis present

## 2020-12-08 LAB — COMPREHENSIVE METABOLIC PANEL
ALT: 20 U/L (ref 0–44)
AST: 27 U/L (ref 15–41)
Albumin: 3.8 g/dL (ref 3.5–5.0)
Alkaline Phosphatase: 63 U/L (ref 38–126)
Anion gap: 11 (ref 5–15)
BUN: 7 mg/dL (ref 6–20)
CO2: 26 mmol/L (ref 22–32)
Calcium: 9.3 mg/dL (ref 8.9–10.3)
Chloride: 98 mmol/L (ref 98–111)
Creatinine, Ser: 0.82 mg/dL (ref 0.44–1.00)
GFR, Estimated: >60 mL/min (ref >60)
Glucose, Bld: 106 mg/dL — ABNORMAL HIGH (ref 70–99)
Potassium: 3.3 mmol/L — ABNORMAL LOW (ref 3.5–5.1)
Sodium: 135 mmol/L (ref 135–145)
Total Bilirubin: 0.3 mg/dL (ref 0.3–1.2)
Total Protein: 7.3 g/dL (ref 6.5–8.1)

## 2020-12-08 LAB — CBC WITH DIFFERENTIAL/PLATELET
Abs Immature Granulocytes: 0.01 10*3/uL (ref 0.00–0.07)
Basophils Absolute: 0 10*3/uL (ref 0.0–0.1)
Basophils Relative: 1 %
Eosinophils Absolute: 0 10*3/uL (ref 0.0–0.5)
Eosinophils Relative: 0 %
HCT: 39.7 % (ref 36.0–46.0)
Hemoglobin: 12.7 g/dL (ref 12.0–15.0)
Immature Granulocytes: 0 %
Lymphocytes Relative: 12 %
Lymphs Abs: 0.6 10*3/uL — ABNORMAL LOW (ref 0.7–4.0)
MCH: 27.6 pg (ref 26.0–34.0)
MCHC: 32 g/dL (ref 30.0–36.0)
MCV: 86.3 fL (ref 80.0–100.0)
Monocytes Absolute: 1 10*3/uL (ref 0.1–1.0)
Monocytes Relative: 21 %
Neutro Abs: 3.2 10*3/uL (ref 1.7–7.7)
Neutrophils Relative %: 66 %
Platelets: 337 10*3/uL (ref 150–400)
RBC: 4.6 MIL/uL (ref 3.87–5.11)
RDW: 12.8 % (ref 11.5–15.5)
WBC: 4.9 10*3/uL (ref 4.0–10.5)
nRBC: 0 % (ref 0.0–0.2)

## 2020-12-08 LAB — URINALYSIS, ROUTINE W REFLEX MICROSCOPIC
Bacteria, UA: NONE SEEN
Bilirubin Urine: NEGATIVE
Glucose, UA: NEGATIVE mg/dL
Hgb urine dipstick: NEGATIVE
Ketones, ur: 20 mg/dL — AB
Leukocytes,Ua: NEGATIVE
Nitrite: NEGATIVE
Protein, ur: 30 mg/dL — AB
Specific Gravity, Urine: 1.015 (ref 1.005–1.030)
pH: 6 (ref 5.0–8.0)

## 2020-12-08 LAB — RESP PANEL BY RT-PCR (FLU A&B, COVID) ARPGX2
Influenza A by PCR: NEGATIVE
Influenza B by PCR: NEGATIVE
SARS Coronavirus 2 by RT PCR: POSITIVE — AB

## 2020-12-08 LAB — LIPASE, BLOOD: Lipase: 24 U/L (ref 11–51)

## 2020-12-08 LAB — TROPONIN I (HIGH SENSITIVITY): Troponin I (High Sensitivity): <2 ng/L (ref <18)

## 2020-12-08 LAB — I-STAT BETA HCG BLOOD, ED (MC, WL, AP ONLY): I-stat hCG, quantitative: <5 m[IU]/mL (ref <5)

## 2020-12-08 MED ORDER — NIRMATRELVIR/RITONAVIR (PAXLOVID)TABLET: 3.0000 | ORAL_TABLET | Freq: Two times a day (BID) | ORAL | 0 refills | Status: DC

## 2020-12-08 MED ORDER — ACETAMINOPHEN 500 MG PO TABS
1000.0000 mg | ORAL_TABLET | Freq: Once | ORAL | Status: AC
  Administered 2020-12-08: 1000 mg via ORAL
  Filled 2020-12-08: qty 2

## 2020-12-08 MED ORDER — NIRMATRELVIR/RITONAVIR (PAXLOVID)TABLET: 3.0000 | ORAL_TABLET | Freq: Two times a day (BID) | ORAL | 0 refills | Status: AC

## 2020-12-08 NOTE — Discharge Instructions (Signed)
All your blood work and x-rays look normal today but you are positive for COVID.  You were given the medication for COVID which can cause some nausea and vomiting.  You need to stop all medications except your blood pressure medication for the next 5 days while you are taking this medication and then you can restart your supplements and birth control.  Make sure you are using some type of protection for the next month so you do not become pregnant.  You can use Tylenol or NyQuil every 4-6 hours as needed for aches pains and fever.  Do not take both at the same time.

## 2020-12-08 NOTE — ED Triage Notes (Signed)
Patient complains of fever, generalized body aches, and productive cough that started on Monday. Denies COVID and flu vaccination. Has not tried any medications to treat symptoms today.

## 2020-12-08 NOTE — ED Provider Notes (Signed)
[facility] EMERGENCY DEPARTMENT Provider Note   CSN: [id number] Arrival date & time: 12/08/20  1320     History Chief Complaint  Patient presents with   Generalized Body Aches    Amanda Cox is a 45 y.o. female.  The history is provided by the patient.  URI Presenting symptoms: congestion, cough, fever and sore throat   Severity:  Moderate Onset quality:  Gradual Duration:  3 days Timing:  Constant Progression:  Worsening Chronicity:  New Relieved by:  Nothing Worsened by:  Nothing Ineffective treatments:  None tried Associated symptoms: headaches and myalgias   Associated symptoms: no neck pain   Associated symptoms comment:  No diarrhea or vomiting.  Some upper abd pain today that doesn't radiate.  No SOB.  No prior COVID or flu shot.  Unknown if pt has had sick contacts. Risk factors comment:  Htn     Past Medical History:  Diagnosis Date   Chronic insomnia 11/08/2016   Dyspareunia    Essential hypertension, benign    Genital herpes, unspecified    Headache syndrome 11/08/2016   Irregular menstrual cycle    Lumbar spondylosis    Meralgia paraesthetica, right 09/10/2020   Neuralgia and neuritis, unspecified    Surveillance of previously prescribed contraceptive pill     Patient Active Problem List   Diagnosis Date Noted   Meralgia paraesthetica, right 09/10/2020   Routine general medical examination at a health care facility 08/02/2018   Constipation 04/11/2018   Chronic insomnia 11/08/2016   Snoring 08/19/2013   Obesity, unspecified 08/19/2013   Essential hypertension, benign 07/10/2013   Genital herpes 07/17/2012    Past Surgical History:  Procedure Laterality Date   BREAST REDUCTION SURGERY  2005   CESAREAN SECTION  2003     OB History     Gravida  2   Para  1   Term  1   Preterm      AB  1   Living  1      SAB      IAB  1   Ectopic      Multiple      Live Births  1           Family  History  Problem Relation Age of Onset   Hypertension Mother    Diabetes Mother    Hyperlipidemia Mother    Arthritis Mother    Hypertension Father    Diabetes Father    Arthritis Sister    Diabetes Sister    Arthritis Maternal Grandmother    Arthritis Paternal Grandmother    Hypertension Paternal Grandmother    Congenital heart disease Other    Colon cancer Neg Hx    Rectal cancer Neg Hx    Esophageal cancer Neg Hx    Stomach cancer Neg Hx    Liver cancer Neg Hx     Social History   Tobacco Use   Smoking status: Never   Smokeless tobacco: Never  Vaping Use   Vaping Use: Never used  Substance Use Topics   Alcohol use: Yes    Alcohol/week: 0.0 standard drinks    Comment: rare- once every 3 months   Drug use: No    Home Medications Prior to Admission medications   Medication Sig Start Date End Date Taking? Authorizing Provider  clindamycin (CLEOCIN) 300 MG capsule Take 1 capsule (300 mg total) by mouth 3 (three) times daily. 10/29/20   [name], MD  fluconazole (DIFLUCAN)  150 MG tablet TAKE 1 TABLET(150 MG) BY MOUTH 1 TIME FOR 1 DOSE Patient not taking: No sig reported 06/01/20   Constant, [name], MD  fluconazole (DIFLUCAN) 200 MG tablet Take 1 tablet (200 mg total) by mouth every 3 (three) days. Patient not taking: Reported on 10/29/2020 07/09/20   [name], MD  fluticasone [facility]) 50 MCG/ACT nasal spray Place 1 spray into both nostrils 2 (two) times daily. Patient not taking: No sig reported 10/29/19   Volney American, PA-C  gabapentin (NEURONTIN) 100 MG capsule Take 2 capsules (200 mg total) by mouth at bedtime. 09/10/20   Kathrynn Ducking, MD  hydrochlorothiazide (HYDRODIURIL) 25 MG tablet Take 1 tablet (25 mg total) by mouth daily. 07/22/18   [name], MD  ibuprofen (ADVIL) 800 MG tablet Take 1 tablet (800 mg total) by mouth every 8 (eight) hours as needed. 07/09/20   [name], MD  metroNIDAZOLE (METROGEL VAGINAL) 0.75 % vaginal gel  Place 1 Applicatorful vaginally 2 (two) times daily. 10/29/20   [name], MD  naproxen (NAPROSYN) 500 MG tablet Take 1 tablet (500 mg total) by mouth 2 (two) times daily. Patient not taking: No sig reported 06/11/20   Raspet, Derry Skill, PA-C  nirmatrelvir/ritonavir EUA (PAXLOVID) 20 x 150 MG & 10 x 100MG  TABS Take 3 tablets by mouth 2 (two) times daily for 5 days. Patient GFR is 60. Take nirmatrelvir (150 mg) two tablets twice daily for 5 days and ritonavir (100 mg) one tablet twice daily for 5 days. 12/08/20 12/13/20  [name], MD  norgestimate-ethinyl estradiol (ORTHO-CYCLEN) 0.25-35 MG-MCG tablet Take 1 tablet by mouth daily.    [provider]  Phentermine HCl 8 MG TABS Take 2 tablets (16 mg) daily at 11 am-12 pm.  After two weeks, may add 1/2 tablet (4 mg) at 4pm 09/10/20   [provider]  valACYclovir (VALTREX) 500 MG tablet Take 1 tablet (500 mg total) by mouth 2 (two) times daily. Take for 3 days prn each occurrence. 07/09/20   [name], MD    Allergies    Sulfa antibiotics  Review of Systems   Review of Systems  Constitutional:  Positive for fever.  HENT:  Positive for congestion and sore throat.   Respiratory:  Positive for cough.   Musculoskeletal:  Positive for myalgias. Negative for neck pain.  Neurological:  Positive for headaches.  All other systems reviewed and are negative.  Physical Exam Updated Vital Signs BP (!) 137/95   Pulse (!) 102   Temp (!) 102.6 F (39.2 C) (Oral)   Resp (!) 22   SpO2 98%   Physical Exam Vitals and nursing note reviewed.  Constitutional:      General: She is not in acute distress.    Appearance: Normal appearance. She is well-developed.  HENT:     Head: Normocephalic and atraumatic.     Nose: Nose normal.     Mouth/Throat:     Mouth: Mucous membranes are moist.     Pharynx: No oropharyngeal exudate or posterior oropharyngeal erythema.  Eyes:     Pupils: Pupils are equal, round, and reactive to  light.  Cardiovascular:     Rate and Rhythm: Regular rhythm. Tachycardia present.     Heart sounds: Normal heart sounds. No murmur heard.   No friction rub.  Pulmonary:     Effort: Pulmonary effort is normal.     Breath sounds: Normal breath sounds. No wheezing or rales.  Abdominal:  General: Bowel sounds are normal. There is no distension.     Palpations: Abdomen is soft.     Tenderness: There is no abdominal tenderness. There is no guarding or rebound.  Musculoskeletal:        General: No tenderness. Normal range of motion.     Cervical back: Normal range of motion and neck supple. No spinous process tenderness or muscular tenderness.     Right lower leg: No edema.     Left lower leg: No edema.     Comments: No edema  Skin:    General: Skin is warm and dry.     Findings: No rash.  Neurological:     Mental Status: She is alert and oriented to person, place, and time. Mental status is at baseline.     Cranial Nerves: No cranial nerve deficit.  Psychiatric:        Mood and Affect: Mood normal.        Behavior: Behavior normal.    ED Results / Procedures / Treatments   Labs (all labs ordered are listed, but only abnormal results are displayed) Labs Reviewed  RESP PANEL BY RT-PCR (FLU A&B, COVID) ARPGX2 - Abnormal; Notable for the following components:      Result Value   SARS Coronavirus 2 by RT PCR POSITIVE (*)    All other components within normal limits  COMPREHENSIVE METABOLIC PANEL - Abnormal; Notable for the following components:   Potassium 3.3 (*)    Glucose, Bld 106 (*)    All other components within normal limits  CBC WITH DIFFERENTIAL/PLATELET - Abnormal; Notable for the following components:   Lymphs Abs 0.6 (*)    All other components within normal limits  URINALYSIS, ROUTINE W REFLEX MICROSCOPIC - Abnormal; Notable for the following components:   Ketones, ur 20 (*)    Protein, ur 30 (*)    All other components within normal limits  LIPASE, BLOOD  I-STAT  BETA HCG BLOOD, ED ([facility], [facility], [facility] ONLY)  TROPONIN I (HIGH SENSITIVITY)  TROPONIN I (HIGH SENSITIVITY)    EKG EKG Interpretation  Date/Time:  Wednesday December 08 2020 13:36:43 EDT Ventricular Rate:  129 PR Interval:  136 QRS Duration: 76 QT Interval:  308 QTC Calculation: 451 R Axis:   61 Text Interpretation: Sinus tachycardia Right atrial enlargement Minimal voltage criteria for LVH, may be normal variant ( Sokolow-Lyon ) Nonspecific ST abnormality No significant change since last tracing Confirmed by [name] [phone]) on 12/08/2020 2:12:29 PM  Radiology DG Chest 2 View  Result Date: 12/08/2020 CLINICAL DATA:  Chest pain. Additional history provided: Patient reports fever, generalized body aches, productive cough, symptoms beginning on Monday. EXAM: CHEST - 2 VIEW COMPARISON:  Prior chest radiographs 02/28/2018 and earlier. FINDINGS: Heart size within normal limits. No appreciable airspace consolidation. No evidence of pleural effusion or pneumothorax. No acute bony abnormality identified. IMPRESSION: No evidence of active cardiopulmonary disease. Electronically Signed   By: Kellie Simmering D.O.   On: 12/08/2020 14:21    Procedures Procedures   Medications Ordered in ED Medications  acetaminophen (TYLENOL) tablet 1,000 mg (1,000 mg Oral Given 12/08/20 1345)    ED Course  I have reviewed the triage vital signs and the nursing notes.  Pertinent labs & imaging results that were available during my care of the patient were reviewed by me and considered in my medical decision making (see chart for details).    MDM Rules/Calculators/A&P  Pt with symptoms consistent with influenza/COVID or other viral illness.  Normal exam here but is febrile and tachycardic.  No signs of breathing difficulty  No signs of strep pharyngitis, otitis or abnormal abdominal findings.   CXR wnl andswabs and labs pending that were ordered in triage.    4:14 PM Labs are  reassuring and no acute abnormalities.  Patient is requesting Paxlovid.  She will discontinue all of her supplements and her birth control and was instructed to use protection so that she does not become pregnant in the next month when she goes back on her birth control pill.  She was given return precautions Will continue antipyretica and rest and fluids and return for any further problems.  MDM   Amount and/or Complexity of Data Reviewed Clinical lab tests: ordered and reviewed Tests in the radiology section of CPT: ordered and reviewed Tests in the medicine section of CPT: ordered and reviewed Independent visualization of images, tracings, or specimens: yes    Final Clinical Impression(s) / ED Diagnoses Final diagnoses:  COVID    Rx / DC Orders ED Discharge Orders          Ordered    nirmatrelvir/ritonavir EUA (PAXLOVID) 20 x 150 MG & 10 x 100MG  TABS  2 times daily,   Status:  Discontinued        12/08/20 1609    nirmatrelvir/ritonavir EUA (PAXLOVID) 20 x 150 MG & 10 x 100MG  TABS  2 times daily,   Status:  Discontinued        12/08/20 1613    nirmatrelvir/ritonavir EUA (PAXLOVID) 20 x 150 MG & 10 x 100MG  TABS  2 times daily        12/08/20 1614             [name], MD 12/08/20 1614

## 2020-12-08 NOTE — ED Provider Notes (Signed)
Emergency Medicine Provider Triage Evaluation Note  Amanda Cox , a 45 y.o. female  was evaluated in triage.  Pt complains of epigastric abdominal pain, chills, generalized myalgias, headache, and productive cough.  Patient reports that she developed productive cough, chills, and generalized myalgias on Monday.  Patient developed generalized headache on Tuesday.  Today she started having epigastric abdominal pain.  Pain is intermittent and radiates to her chest.  Patient describes pain as sharp.  Denies any diaphoresis, nausea, vomiting, shortness of breath.  Patient denies any known sick contacts.  Has not been vaccinated for COVID-19.  Review of Systems  Positive: epigastric abdominal pain, chills, generalized myalgias, headache, and productive cough Negative: Diaphoresis, nausea, vomiting, shortness of breath, leg swelling or tenderness,  Physical Exam  BP (!) 143/102 (BP Location: Right Arm)   Pulse (!) 123   Temp (!) 102.6 F (39.2 C) (Oral)   Resp 20   SpO2 97%  Gen:   Awake, no distress   Resp:  Normal effort, lungs clear to auscultation bilaterally MSK:   Moves extremities without difficulty, no swelling or tenderness to bilateral lower extremities. Other:  Abdomen soft, nondistended, nontender.  Medical Decision Making  Medically screening exam initiated at 1:38 PM.  Appropriate orders placed.  Amanda Cox was informed that the remainder of the evaluation will be completed by another provider, this initial triage assessment does not replace that evaluation, and the importance of remaining in the ED until their evaluation is complete.  Chest pain and COVID-19 symptoms.  Patient reports that she would be open to antiviral therapy if COVID-19 positive.  Patient given Tylenol for fever.   [name], PA-C 12/08/20 1340    [name], MD 12/15/20 1541

## 2020-12-17 ENCOUNTER — Ambulatory Visit: Payer: No Typology Code available for payment source

## 2021-01-25 ENCOUNTER — Ambulatory Visit: Payer: No Typology Code available for payment source | Admitting: Neurology

## 2021-02-09 DIAGNOSIS — R7303 Prediabetes: Secondary | ICD-10-CM | POA: Insufficient documentation

## 2021-03-23 ENCOUNTER — Ambulatory Visit (HOSPITAL_COMMUNITY)
Admission: EM | Admit: 2021-03-23 | Discharge: 2021-03-23 | Disposition: A | Discharge status: Home / Self Care | Payer: No Typology Code available for payment source | Attending: Family Medicine | Admitting: Family Medicine

## 2021-03-23 ENCOUNTER — Other Ambulatory Visit: Payer: Self-pay

## 2021-03-23 ENCOUNTER — Encounter (HOSPITAL_COMMUNITY): Payer: Self-pay | Admitting: *Deleted

## 2021-03-23 DIAGNOSIS — R42 Dizziness and giddiness: Secondary | ICD-10-CM | POA: Diagnosis present

## 2021-03-23 DIAGNOSIS — R110 Nausea: Secondary | ICD-10-CM | POA: Diagnosis present

## 2021-03-23 LAB — CBC WITH DIFFERENTIAL/PLATELET
Abs Immature Granulocytes: 0.02 10*3/uL (ref 0.00–0.07)
Basophils Absolute: 0.1 10*3/uL (ref 0.0–0.1)
Basophils Relative: 1 %
Eosinophils Absolute: 0.1 10*3/uL (ref 0.0–0.5)
Eosinophils Relative: 1 %
HCT: 40.3 % (ref 36.0–46.0)
Hemoglobin: 12.8 g/dL (ref 12.0–15.0)
Immature Granulocytes: 0 %
Lymphocytes Relative: 30 %
Lymphs Abs: 2.9 10*3/uL (ref 0.7–4.0)
MCH: 27.4 pg (ref 26.0–34.0)
MCHC: 31.8 g/dL (ref 30.0–36.0)
MCV: 86.3 fL (ref 80.0–100.0)
Monocytes Absolute: 0.7 10*3/uL (ref 0.1–1.0)
Monocytes Relative: 8 %
Neutro Abs: 5.9 10*3/uL (ref 1.7–7.7)
Neutrophils Relative %: 60 %
Platelets: 416 10*3/uL — ABNORMAL HIGH (ref 150–400)
RBC: 4.67 MIL/uL (ref 3.87–5.11)
RDW: 12.8 % (ref 11.5–15.5)
WBC: 9.6 10*3/uL (ref 4.0–10.5)
nRBC: 0 % (ref 0.0–0.2)

## 2021-03-23 LAB — POCT URINALYSIS DIPSTICK, ED / UC
Bilirubin Urine: NEGATIVE
Glucose, UA: NEGATIVE mg/dL
Ketones, ur: 15 mg/dL — AB
Leukocytes,Ua: NEGATIVE
Nitrite: NEGATIVE
Protein, ur: 100 mg/dL — AB
Specific Gravity, Urine: 1.025 (ref 1.005–1.030)
Urobilinogen, UA: 0.2 mg/dL (ref 0.0–1.0)
pH: 6.5 (ref 5.0–8.0)

## 2021-03-23 LAB — TSH: TSH: 1.443 u[IU]/mL (ref 0.350–4.500)

## 2021-03-23 LAB — COMPREHENSIVE METABOLIC PANEL
ALT: 15 U/L (ref 0–44)
AST: 15 U/L (ref 15–41)
Albumin: 3.9 g/dL (ref 3.5–5.0)
Alkaline Phosphatase: 50 U/L (ref 38–126)
Anion gap: 10 (ref 5–15)
BUN: 8 mg/dL (ref 6–20)
CO2: 28 mmol/L (ref 22–32)
Calcium: 9.4 mg/dL (ref 8.9–10.3)
Chloride: 98 mmol/L (ref 98–111)
Creatinine, Ser: 0.83 mg/dL (ref 0.44–1.00)
GFR, Estimated: >60 mL/min (ref >60)
Glucose, Bld: 101 mg/dL — ABNORMAL HIGH (ref 70–99)
Potassium: 4.2 mmol/L (ref 3.5–5.1)
Sodium: 136 mmol/L (ref 135–145)
Total Bilirubin: 0.8 mg/dL (ref 0.3–1.2)
Total Protein: 7.4 g/dL (ref 6.5–8.1)

## 2021-03-23 LAB — POC URINE PREG, ED: Preg Test, Ur: NEGATIVE

## 2021-03-23 LAB — CBG MONITORING, ED: Glucose-Capillary: 90 mg/dL (ref 70–99)

## 2021-03-23 MED ORDER — ONDANSETRON 4 MG PO TBDP: 4.0000 mg | ORAL_TABLET | Freq: Three times a day (TID) | ORAL | 0 refills | Status: DC | PRN

## 2021-03-23 NOTE — Discharge Instructions (Signed)
You have had labs (blood work) sent today. We will call you with any significant abnormalities or if there is need to begin or change treatment or pursue further follow up.  You may also review your test results online through MyChart. If you do not have a MyChart account, instructions to sign up should be on your discharge paperwork.  

## 2021-03-23 NOTE — ED Triage Notes (Signed)
Pt reports for past 2 months jjust before menses started she felt dizzy,lightheaded and had blurred vision. Pt reports checking BP after work today SBP in 140's and DSP 106. Pt tales HZTC 25 mg for HTN .

## 2021-03-23 NOTE — ED Provider Notes (Signed)
[place]   [id number] 03/23/21 Arrival Time: 5852  ASSESSMENT & PLAN:  1. Lightheadedness    Unclear etiology at this time. Normal neurologic exam. No suspicion for ICH or SAH. No indication for urgent neurodiagnostic imaging at this time. Discussed. Declines ECG. Without lab abnormalities that would explain symptoms. Same symptoms last month just before menstrual period. Patient's last menstrual period was 02/23/2021. Due to begin menstrual period.   Results for orders placed or performed during the hospital encounter of 03/23/21  CBC with Differential/Platelet  Result Value Ref Range   WBC 9.6 4.0 - 10.5 K/uL   RBC 4.67 3.87 - 5.11 MIL/uL   Hemoglobin 12.8 12.0 - 15.0 g/dL   HCT 40.3 36.0 - 46.0 %   MCV 86.3 80.0 - 100.0 fL   MCH 27.4 26.0 - 34.0 pg   MCHC 31.8 30.0 - 36.0 g/dL   RDW 12.8 11.5 - 15.5 %   Platelets 416 (H) 150 - 400 K/uL   nRBC 0.0 0.0 - 0.2 %   Neutrophils Relative % 60 %   Neutro Abs 5.9 1.7 - 7.7 K/uL   Lymphocytes Relative 30 %   Lymphs Abs 2.9 0.7 - 4.0 K/uL   Monocytes Relative 8 %   Monocytes Absolute 0.7 0.1 - 1.0 K/uL   Eosinophils Relative 1 %   Eosinophils Absolute 0.1 0.0 - 0.5 K/uL   Basophils Relative 1 %   Basophils Absolute 0.1 0.0 - 0.1 K/uL   Immature Granulocytes 0 %   Abs Immature Granulocytes 0.02 0.00 - 0.07 K/uL  Comprehensive metabolic panel  Result Value Ref Range   Sodium 136 135 - 145 mmol/L   Potassium 4.2 3.5 - 5.1 mmol/L   Chloride 98 98 - 111 mmol/L   CO2 28 22 - 32 mmol/L   Glucose, Bld 101 (H) 70 - 99 mg/dL   BUN 8 6 - 20 mg/dL   Creatinine, Ser 0.83 0.44 - 1.00 mg/dL   Calcium 9.4 8.9 - 10.3 mg/dL   Total Protein 7.4 6.5 - 8.1 g/dL   Albumin 3.9 3.5 - 5.0 g/dL   AST 15 15 - 41 U/L   ALT 15 0 - 44 U/L   Alkaline Phosphatase 50 38 - 126 U/L   Total Bilirubin 0.8 0.3 - 1.2 mg/dL   GFR, Estimated >60 >60 mL/min   Anion gap 10 5 - 15  TSH  Result Value Ref Range   TSH 1.443 0.350 - 4.500 uIU/mL   POC Urinalysis dipstick  Result Value Ref Range   Glucose, UA NEGATIVE NEGATIVE mg/dL   Bilirubin Urine NEGATIVE NEGATIVE   Ketones, ur 15 (A) NEGATIVE mg/dL   Specific Gravity, Urine 1.025 1.005 - 1.030   Hgb urine dipstick SMALL (A) NEGATIVE   pH 6.5 5.0 - 8.0   Protein, ur 100 (A) NEGATIVE mg/dL   Urobilinogen, UA 0.2 0.0 - 1.0 mg/dL   Nitrite NEGATIVE NEGATIVE   Leukocytes,Ua NEGATIVE NEGATIVE  POC urine pregnancy  Result Value Ref Range   Preg Test, Ur NEGATIVE NEGATIVE  POC CBG monitoring  Result Value Ref Range   Glucose-Capillary 90 70 - 99 mg/dL    Reassured that these symptoms do not appear to represent a serious or threatening condition.  Follow-up Information     [facility].   Specialty: Emergency Medicine Why: If symptoms worsen in any way. Contact information: [address] [id number] [facility] [place] [phone]  Will try to schedule f/u with PCP.  Reviewed expectations re: course of current medical issues. Questions answered. Outlined signs and symptoms indicating need for more acute intervention. Patient verbalized understanding. After Visit Summary given.   SUBJECTIVE:  Amanda Cox is a 46 y.o. female who reports occas feeling of lightheadedness before menstrual period; same last month. Patient's last menstrual period was 02/23/2021. BP slightly elevated at work today. Is treated for HTN. No vertigo described. Afebrile. Ambulatory without assistance. No recent illnesses. Normal vision and hearing. Sleeping well. Without CP/SOB/ No tx PTA.  Social History   Substance and Sexual Activity  Alcohol Use Yes   Alcohol/week: 0.0 standard drinks   Comment: rare- once every 3 months   Social History   Tobacco Use  Smoking Status Never  Smokeless Tobacco Never   Denies recreational drug use.   OBJECTIVE:  Vitals:   03/23/21 1817  BP: 138/89   Pulse: 88  Resp: 18  Temp: 99.1 F (37.3 C)  SpO2: 99%    General appearance: alert; no distress Eyes: PERRLA; EOMI; conjunctiva normal HENT: normocephalic; atraumatic; TMs normal; nasal mucosa normal; oral mucosa normal Neck: supple with FROM Lungs: clear to auscultation bilaterally Heart: regular rate and rhythm Abdomen: soft, non-tender; bowel sounds normal Extremities: no cyanosis or edema; symmetrical with no gross deformities Skin: warm and dry Neurologic: normal gait; DTR's normal and symmetric; CN 2-12 grossly intact Psychological: alert and cooperative; normal mood and affect  Investigations: Results for orders placed or performed during the hospital encounter of 03/23/21  CBC with Differential/Platelet  Result Value Ref Range   WBC 9.6 4.0 - 10.5 K/uL   RBC 4.67 3.87 - 5.11 MIL/uL   Hemoglobin 12.8 12.0 - 15.0 g/dL   HCT 40.3 36.0 - 46.0 %   MCV 86.3 80.0 - 100.0 fL   MCH 27.4 26.0 - 34.0 pg   MCHC 31.8 30.0 - 36.0 g/dL   RDW 12.8 11.5 - 15.5 %   Platelets 416 (H) 150 - 400 K/uL   nRBC 0.0 0.0 - 0.2 %   Neutrophils Relative % 60 %   Neutro Abs 5.9 1.7 - 7.7 K/uL   Lymphocytes Relative 30 %   Lymphs Abs 2.9 0.7 - 4.0 K/uL   Monocytes Relative 8 %   Monocytes Absolute 0.7 0.1 - 1.0 K/uL   Eosinophils Relative 1 %   Eosinophils Absolute 0.1 0.0 - 0.5 K/uL   Basophils Relative 1 %   Basophils Absolute 0.1 0.0 - 0.1 K/uL   Immature Granulocytes 0 %   Abs Immature Granulocytes 0.02 0.00 - 0.07 K/uL  Comprehensive metabolic panel  Result Value Ref Range   Sodium 136 135 - 145 mmol/L   Potassium 4.2 3.5 - 5.1 mmol/L   Chloride 98 98 - 111 mmol/L   CO2 28 22 - 32 mmol/L   Glucose, Bld 101 (H) 70 - 99 mg/dL   BUN 8 6 - 20 mg/dL   Creatinine, Ser 0.83 0.44 - 1.00 mg/dL   Calcium 9.4 8.9 - 10.3 mg/dL   Total Protein 7.4 6.5 - 8.1 g/dL   Albumin 3.9 3.5 - 5.0 g/dL   AST 15 15 - 41 U/L   ALT 15 0 - 44 U/L   Alkaline Phosphatase 50 38 - 126 U/L   Total  Bilirubin 0.8 0.3 - 1.2 mg/dL   GFR, Estimated >60 >60 mL/min   Anion gap 10 5 - 15  TSH  Result Value Ref Range   TSH 1.443  0.350 - 4.500 uIU/mL  POC Urinalysis dipstick  Result Value Ref Range   Glucose, UA NEGATIVE NEGATIVE mg/dL   Bilirubin Urine NEGATIVE NEGATIVE   Ketones, ur 15 (A) NEGATIVE mg/dL   Specific Gravity, Urine 1.025 1.005 - 1.030   Hgb urine dipstick SMALL (A) NEGATIVE   pH 6.5 5.0 - 8.0   Protein, ur 100 (A) NEGATIVE mg/dL   Urobilinogen, UA 0.2 0.0 - 1.0 mg/dL   Nitrite NEGATIVE NEGATIVE   Leukocytes,Ua NEGATIVE NEGATIVE  POC urine pregnancy  Result Value Ref Range   Preg Test, Ur NEGATIVE NEGATIVE  POC CBG monitoring  Result Value Ref Range   Glucose-Capillary 90 70 - 99 mg/dL   Labs Reviewed  POCT URINALYSIS DIPSTICK, ED / UC  POC URINE PREG, ED  CBG MONITORING, ED   No results found.  Allergies  Allergen Reactions   Sulfa Antibiotics Shortness Of Breath    Past Medical History:  Diagnosis Date   Chronic insomnia 11/08/2016   Dyspareunia    Essential hypertension, benign    Genital herpes, unspecified    Headache syndrome 11/08/2016   Irregular menstrual cycle    Lumbar spondylosis    Meralgia paraesthetica, right 09/10/2020   Neuralgia and neuritis, unspecified    Surveillance of previously prescribed contraceptive pill    Social History   Socioeconomic History   Marital status: Single    Spouse name: Not on file   Number of children: 1   Years of education: College   Highest education level: Not on file  Occupational History    Employer: [employer]  Tobacco Use   Smoking status: Never   Smokeless tobacco: Never  Vaping Use   Vaping Use: Never used  Substance and Sexual Activity   Alcohol use: Yes    Alcohol/week: 0.0 standard drinks    Comment: rare- once every 3 months   Drug use: No   Sexual activity: Yes    Partners: Male    Birth control/protection: OCP  Other Topics Concern   Not on file  Social History  Narrative   Patient is single and lives at home, her daughter lives with her.   Patient has a college education.   Patient is right-handed.   Caffeine use:  1-2 daily   Patient has one child.   Social Determinants of Health   Financial Resource Strain: Not on file  Food Insecurity: Not on file  Transportation Needs: Not on file  Physical Activity: Not on file  Stress: Not on file  Social Connections: Not on file  Intimate Partner Violence: Not on file   Family History  Problem Relation Age of Onset   Hypertension Mother    Diabetes Mother    Hyperlipidemia Mother    Arthritis Mother    Hypertension Father    Diabetes Father    Arthritis Sister    Diabetes Sister    Arthritis Maternal Grandmother    Arthritis Paternal Grandmother    Hypertension Paternal Grandmother    Congenital heart disease Other    Colon cancer Neg Hx    Rectal cancer Neg Hx    Esophageal cancer Neg Hx    Stomach cancer Neg Hx    Liver cancer Neg Hx    Past Surgical History:  Procedure Laterality Date   BREAST REDUCTION SURGERY  2005   CESAREAN SECTION  2003       Vanessa Kick, MD 03/24/21 [phone]

## 2021-06-14 ENCOUNTER — Other Ambulatory Visit: Payer: Self-pay | Admitting: Obstetrics

## 2021-06-14 DIAGNOSIS — Z3041 Encounter for surveillance of contraceptive pills: Secondary | ICD-10-CM

## 2021-06-21 ENCOUNTER — Other Ambulatory Visit: Payer: Self-pay | Admitting: Obstetrics

## 2021-06-21 DIAGNOSIS — A6000 Herpesviral infection of urogenital system, unspecified: Secondary | ICD-10-CM

## 2021-06-21 MED ORDER — VALTREX 500 MG PO TABS: 500.0000 mg | ORAL_TABLET | Freq: Two times a day (BID) | ORAL | 5 refills | Status: DC

## 2021-06-23 ENCOUNTER — Other Ambulatory Visit: Payer: Self-pay | Admitting: Emergency Medicine

## 2021-06-23 DIAGNOSIS — A6000 Herpesviral infection of urogenital system, unspecified: Secondary | ICD-10-CM

## 2021-06-23 MED ORDER — VALACYCLOVIR HCL 500 MG PO TABS: 500.0000 mg | ORAL_TABLET | Freq: Two times a day (BID) | ORAL | 5 refills | Status: DC

## 2021-06-23 NOTE — Progress Notes (Signed)
Valtrex reordered for Valacyclovir for insurance coverage. ?

## 2021-06-29 ENCOUNTER — Ambulatory Visit: Payer: No Typology Code available for payment source | Admitting: Physician Assistant

## 2021-06-30 ENCOUNTER — Telehealth: Payer: Self-pay

## 2021-06-30 NOTE — Telephone Encounter (Signed)
S/w patient and advised that the prescription PA was currently being processed. ?

## 2021-06-30 NOTE — Telephone Encounter (Signed)
PA for Valtrex sent to [facility] 06/30/21 '@5'$ :08 pm ?

## 2021-07-07 NOTE — Progress Notes (Signed)
Cardiology Office Note    Date:  07/19/2021   ID:  Amanda Cox, DOB 1975-07-31, MRN [id number]   PCP:  Amanda Cox, [place]  Cardiologist:  Candee Furbish, MD   Advanced Practice Provider:  No care team member to display Electrophysiologist:  None   [id number]}   Chief Complaint  Patient presents with   Palpitations    History of Present Illness:  Amanda Cox is a 46 y.o. female who saw Dr. Marlou Porch 09/05/18 for  chest pain and EKG with possible LVH and likely costochondritis diagnosis.She was having palpitations and Zio 09/2018 symptoms correlated with PVC's -advised to avoid caffeine.Echo 08/2018 normal LVEF 60-65% no LVH.  Patient comes in for f/u. She has had increased palpitations with chest tightness recently with increase stress with her daughter. Lasts 5 min and comes/goes all day. She has gained some weight. Going to start exercising today. Drinking no caffeine, occasional alcohol. Was worse when she was only getting 5 hrs sleep. Was on phentermine 3 months ago but stopped. She had labs last week with PCP. TSH was normal in Feb. LDL was 102.     Past Medical History:  Diagnosis Date   Chronic insomnia 11/08/2016   Dyspareunia    Essential hypertension, benign    Genital herpes, unspecified    Headache syndrome 11/08/2016   Irregular menstrual cycle    Lumbar spondylosis    Meralgia paraesthetica, right 09/10/2020   Neuralgia and neuritis, unspecified    Surveillance of previously prescribed contraceptive pill     Past Surgical History:  Procedure Laterality Date   BREAST REDUCTION SURGERY  2005   CESAREAN SECTION  2003    Current Medications: Current Meds  Medication Sig   amLODIPine-Valsartan-HCTZ 10-160-12.5 MG TABS Take by mouth.   fluconazole (DIFLUCAN) 200 MG tablet Take 1 tablet (200 mg total) by mouth every 3 (three) days.   ibuprofen (ADVIL) 800 MG tablet Take 1 tablet (800 mg total) by mouth every  8 (eight) hours as needed.   metroNIDAZOLE (METROGEL VAGINAL) 0.75 % vaginal gel Place 1 Applicatorful vaginally 2 (two) times daily.   norgestimate-ethinyl estradiol (ORTHO-CYCLEN) 0.25-35 MG-MCG tablet Take 1 tablet by mouth daily.   TRI-LO-MILI 0.18/0.215/0.25 MG-25 MCG tab TAKE 1 TABLET BY MOUTH DAILY   valACYclovir (VALTREX) 500 MG tablet Take 1 tablet (500 mg total) by mouth 2 (two) times daily. Take for 3 days prn each occurrence.     Allergies:   Sulfa antibiotics   Social History   Socioeconomic History   Marital status: Single    Spouse name: Not on file   Number of children: 1   Years of education: College   Highest education level: Not on file  Occupational History    Employer: [employer]  Tobacco Use   Smoking status: Never   Smokeless tobacco: Never  Vaping Use   Vaping Use: Never used  Substance and Sexual Activity   Alcohol use: Yes    Alcohol/week: 0.0 standard drinks    Comment: rare- once every 3 months   Drug use: No   Sexual activity: Yes    Partners: Male    Birth control/protection: OCP  Other Topics Concern   Not on file  Social History Narrative   Patient is single and lives at home, her daughter lives with her.   Patient has a college education.   Patient is right-handed.   Caffeine use:  1-2 daily   Patient has  one child.   Social Determinants of Health   Financial Resource Strain: Not on file  Food Insecurity: Not on file  Transportation Needs: Not on file  Physical Activity: Not on file  Stress: Not on file  Social Connections: Not on file     Family History:  The patient's  family history includes Arthritis in her maternal grandmother, mother, paternal grandmother, and sister; Congenital heart disease in an other family member; Diabetes in her father, mother, and sister; Hyperlipidemia in her mother; Hypertension in her father, mother, and paternal grandmother.   ROS:   Please see the history of present illness.    ROS All  other systems reviewed and are negative.   PHYSICAL EXAM:   VS:  BP 130/80   Pulse 78   Ht '5\' 3"'$  (1.6 m)   Wt 215 lb 6.4 oz (97.7 kg)   LMP 06/21/2021   BMI 38.16 kg/m   Physical Exam  GEN: Obese, in no acute distress  Neck: no JVD, carotid bruits, or masses Cardiac:RRR; no murmurs, rubs, or gallops  Respiratory:  clear to auscultation bilaterally, normal work of breathing GI: soft, nontender, nondistended, + BS Ext: without cyanosis, clubbing, or edema, Good distal pulses bilaterally Neuro:  Alert and Oriented x 3, Psych: euthymic mood, full affect  Wt Readings from Last 3 Encounters:  07/19/21 215 lb 6.4 oz (97.7 kg)  07/12/21 210 lb (95.3 kg)  10/29/20 216 lb (98 kg)      Studies/Labs Reviewed:   EKG:  EKG is not ordered today.  The ekg  reviewed from 12/08/21 sinus tachycardia 129/m LVH  Recent Labs: 03/23/2021: ALT 15; BUN 8; Creatinine, Ser 0.83; Hemoglobin 12.8; Platelets 416; Potassium 4.2; Sodium 136; TSH 1.443   Lipid Panel    Component Value Date/Time   CHOL 199 08/02/2018 0943   TRIG 80.0 08/02/2018 0943   HDL 75.30 08/02/2018 0943   CHOLHDL 3 08/02/2018 0943   VLDL 16.0 08/02/2018 0943   LDLCALC 108 (H) 08/02/2018 0943    Additional studies/ records that were reviewed today include:  Echo 09/10/18 IMPRESSIONS     1. The left ventricle has normal systolic function with an ejection  fraction of 60-65%. The cavity size was normal. Left ventricular diastolic  parameters were normal. No evidence of left ventricular regional wall  motion abnormalities.   2. The right ventricle has normal systolic function. The cavity was  normal. There is no increase in right ventricular wall thickness.   3. The aortic root is normal in size and structure.   FINDINGS   Left Ventricle: The left ventricle has normal systolic function, with an  ejection fraction of 60-65%. The cavity size was normal. There is no  increase in left ventricular wall thickness. Left  ventricular diastolic  parameters were normal. No evidence of  left ventricular regional wall motion abnormalities..   Right Ventricle: The right ventricle has normal systolic function. The  cavity was normal. There is no increase in right ventricular wall  thickness.   Left Atrium: Left atrial size was normal in size.   Right Atrium: Right atrial size was normal in size. Right atrial pressure  is estimated at 3 mmHg.   Interatrial Septum: No atrial level shunt detected by color flow Doppler.   Pericardium: There is no evidence of pericardial effusion.   Mitral Valve: The mitral valve is normal in structure. Mitral valve  regurgitation is not visualized by color flow Doppler.   Tricuspid Valve: The tricuspid valve is  normal in structure. Tricuspid  valve regurgitation was not visualized by color flow Doppler.   Aortic Valve: The aortic valve is normal in structure. Aortic valve  regurgitation was not visualized by color flow Doppler. There is no  evidence of aortic valve stenosis.   Pulmonic Valve: The pulmonic valve was not well visualized. Pulmonic valve  regurgitation is trivial by color flow Doppler.   Aorta: The aortic root is normal in size and structure.   Venous: The inferior vena cava is normal in size with greater than 50%  respiratory variability.   Zio 09/2018 Rhythm is sinus. No atrial fibrillation. Symptoms of palpitations do correlate with PVCs or premature ventricular contractions. PVCs are rare. No ventricular tachycardia, no pauses, no adverse arrhythmias.   Symptoms correlate with PVCs.  Would recommend conservative management.  Try to avoid caffeine.  Daily exercise. Overall reassuring.     Risk Assessment/Calculations:         ASSESSMENT:    1. Palpitations   2. Chest pain, unspecified type   3. Essential hypertension   4. Class 2 obesity due to excess calories without serious comorbidity with body mass index (BMI) of 38.0 to 38.9 in adult       PLAN:  In order of problems listed above:  Palpitations with PVC's on Zio 09/2018. Worse with increase home stress, off/on all day long. Will place Zio  Chest pain-tightness with palpitations, different from before. Will order exercise myoview.  HTN-controlled  Obesity-exercise and weight loss discussed  Shared Decision Making/Informed Consent   Shared Decision Making/Informed Consent The risks [chest pain, shortness of breath, cardiac arrhythmias, dizziness, blood pressure fluctuations, myocardial infarction, stroke/transient ischemic attack, nausea, vomiting, allergic reaction, radiation exposure, metallic taste sensation and life-threatening complications (estimated to be 1 in 10,000)], benefits (risk stratification, diagnosing coronary artery disease, treatment guidance) and alternatives of a nuclear stress test were discussed in detail with Amanda Cox and she agrees to proceed.    Medication Adjustments/Labs and Tests Ordered: Current medicines are reviewed at length with the patient today.  Concerns regarding medicines are outlined above.  Medication changes, Labs and Tests ordered today are listed in the Patient Instructions below. Patient Instructions  Medication Instructions:  Your physician recommends that you continue on your current medications as directed. Please refer to the Current Medication list given to you today.  *If you need a refill on your cardiac medications before your next appointment, please call your pharmacy*   Lab Work: None If you have labs (blood work) drawn today and your tests are completely normal, you will receive your results only by: [place] (if you have MyChart) OR A paper copy in the mail If you have any lab test that is abnormal or we need to change your treatment, we will call you to review the results.   Testing/Procedures: Your physician has requested that you have a lexiscan myoview. For further information please visit  [email or web address]. Please follow instruction sheet, as given.   Follow-Up: At [facility], you and your health needs are our priority.  As part of our continuing mission to provide you with exceptional heart care, we have created designated Provider Care Teams.  These Care Teams include your primary Cardiologist (physician) and Advanced Practice Providers (APPs -  Physician Assistants and Nurse Practitioners) who all work together to provide you with the care you need, when you need it.  We recommend signing up for the patient portal called "MyChart".  Sign up information is provided on this  After Visit Summary.  MyChart is used to connect with patients for Virtual Visits (Telemedicine).  Patients are able to view lab/test results, encounter notes, upcoming appointments, etc.  Non-urgent messages can be sent to your provider as well.   To learn more about what you can do with MyChart, go to [email or web address].    Your next appointment:   1 year(s)  The format for your next appointment:   In Person  Provider:   Candee Furbish, MD     Other Instructions  ZIO XT- Long Term Monitor Instructions  Your physician has requested you wear a ZIO patch monitor for 14 days.  This is a single patch monitor. [employer] supplies one patch monitor per enrollment. Additional stickers are not available. Please do not apply patch if you will be having a Nuclear Stress Test,  Echocardiogram, Cardiac CT, MRI, or Chest Xray during the period you would be wearing the  monitor. The patch cannot be worn during these tests. You cannot remove and re-apply the  ZIO XT patch monitor.  Your ZIO patch monitor will be mailed 3 day USPS to your address on file. It may take 3-5 days  to receive your monitor after you have been enrolled.  Once you have received your monitor, please review the enclosed instructions. Your monitor  has already been registered assigning a specific monitor serial # to you.  Billing and  Patient Assistance Program Information  We have supplied [employer] with any of your insurance information on file for billing purposes. [employer] offers a sliding scale Patient Assistance Program for patients that do not have  insurance, or whose insurance does not completely cover the cost of the ZIO monitor.  You must apply for the Patient Assistance Program to qualify for this discounted rate.  To apply, please call [employer] at [phone], select option 4, select option 2, ask to apply for  Patient Assistance Program. Amanda Cox will ask your household income, and how many people  are in your household. They will quote your out-of-pocket cost based on that information.  [employer] will also be able to set up a 3-month interest-free payment plan if needed.  Applying the monitor   Shave hair from upper left chest.  Hold abrader disc by orange tab. Rub abrader in 40 strokes over the upper left chest as  indicated in your monitor instructions.  Clean area with 4 enclosed alcohol pads. Let dry.  Apply patch as indicated in monitor instructions. Patch will be placed under collarbone on left  side of chest with arrow pointing upward.  Rub patch adhesive wings for 2 minutes. Remove white label marked "1". Remove the white  label marked "2". Rub patch adhesive wings for 2 additional minutes.  While looking in a mirror, press and release button in center of patch. A small green light will  flash 3-4 times. This will be your only indicator that the monitor has been turned on.  Do not shower for the first 24 hours. You may shower after the first 24 hours.  Press the button if you feel a symptom. You will hear a small click. Record Date, Time and  Symptom in the Patient Logbook.  When you are ready to remove the patch, follow instructions on the last 2 pages of Patient  Logbook. Stick patch monitor onto the last page of Patient Logbook.  Place Patient Logbook in the blue and white box. Use locking tab on  box and tape box closed  securely. The blue and white  box has prepaid postage on it. Please place it in the mailbox as  soon as possible. Your physician should have your test results approximately 7 days after the  monitor has been mailed back to [facility].  Call [facility] at [phone] if you have questions regarding  your ZIO XT patch monitor. Call them immediately if you see an orange light blinking on your  monitor.  If your monitor falls off in less than 4 days, contact our Monitor department at [phone].  If your monitor becomes loose or falls off after 4 days call [employer] at [phone] for  suggestions on securing your monitor      Signed, Ermalinda Barrios, Hershal Coria  07/19/2021 8:50 AM    [address], [place], [place]  [address] Phone: [phone]; Fax: [phone]

## 2021-07-12 ENCOUNTER — Encounter: Payer: Self-pay | Admitting: Obstetrics

## 2021-07-12 ENCOUNTER — Ambulatory Visit (INDEPENDENT_AMBULATORY_CARE_PROVIDER_SITE_OTHER): Payer: No Typology Code available for payment source | Admitting: Obstetrics

## 2021-07-12 ENCOUNTER — Other Ambulatory Visit (HOSPITAL_COMMUNITY)
Admission: RE | Admit: 2021-07-12 | Discharge: 2021-07-12 | Disposition: A | Discharge status: Home / Self Care | Payer: No Typology Code available for payment source | Source: Ambulatory Visit | Attending: Obstetrics | Admitting: Obstetrics

## 2021-07-12 VITALS — BP 126/84 | HR 72 | Ht 63.0 in | Wt 210.0 lb

## 2021-07-12 DIAGNOSIS — N898 Other specified noninflammatory disorders of vagina: Secondary | ICD-10-CM | POA: Insufficient documentation

## 2021-07-12 DIAGNOSIS — Z01419 Encounter for gynecological examination (general) (routine) without abnormal findings: Secondary | ICD-10-CM | POA: Insufficient documentation

## 2021-07-12 DIAGNOSIS — A6000 Herpesviral infection of urogenital system, unspecified: Secondary | ICD-10-CM

## 2021-07-12 DIAGNOSIS — N760 Acute vaginitis: Secondary | ICD-10-CM

## 2021-07-12 DIAGNOSIS — B9689 Other specified bacterial agents as the cause of diseases classified elsewhere: Secondary | ICD-10-CM

## 2021-07-12 DIAGNOSIS — E669 Obesity, unspecified: Secondary | ICD-10-CM

## 2021-07-12 DIAGNOSIS — Z113 Encounter for screening for infections with a predominantly sexual mode of transmission: Secondary | ICD-10-CM | POA: Diagnosis not present

## 2021-07-12 DIAGNOSIS — I10 Essential (primary) hypertension: Secondary | ICD-10-CM

## 2021-07-12 DIAGNOSIS — B3731 Acute candidiasis of vulva and vagina: Secondary | ICD-10-CM

## 2021-07-12 DIAGNOSIS — Z3041 Encounter for surveillance of contraceptive pills: Secondary | ICD-10-CM | POA: Diagnosis not present

## 2021-07-12 DIAGNOSIS — N946 Dysmenorrhea, unspecified: Secondary | ICD-10-CM

## 2021-07-12 MED ORDER — METRONIDAZOLE 0.75 % VA GEL: 1.0000 | Freq: Two times a day (BID) | VAGINAL | 5 refills | Status: DC

## 2021-07-12 MED ORDER — FLUCONAZOLE 200 MG PO TABS: 200.0000 mg | ORAL_TABLET | ORAL | 4 refills | Status: DC

## 2021-07-12 MED ORDER — NORGESTIMATE-ETH ESTRADIOL 0.25-35 MG-MCG PO TABS: 1.0000 | ORAL_TABLET | Freq: Every day | ORAL | 11 refills | Status: DC

## 2021-07-12 MED ORDER — IBUPROFEN 800 MG PO TABS: 800.0000 mg | ORAL_TABLET | Freq: Three times a day (TID) | ORAL | 5 refills | Status: DC | PRN

## 2021-07-12 NOTE — Progress Notes (Signed)
Subjective:        Amanda Cox is a 46 y.o. female here for a routine exam.  Current complaints: Vaginal discharge .    Personal health questionnaire:  Is patient Amanda Cox, have a family history of breast and/or ovarian cancer: no Is there a family history of uterine cancer diagnosed at age < 64, gastrointestinal cancer, urinary tract cancer, family member who is a Field seismologist syndrome-associated carrier: no Is the patient overweight and hypertensive, family history of diabetes, personal history of gestational diabetes, preeclampsia or PCOS: yes Is patient over 36, have PCOS,  family history of premature CHD under age 52, diabetes, smoke, have hypertension or peripheral artery disease:  no At any time, has a partner hit, kicked or otherwise hurt or frightened you?: no Over the past 2 weeks, have you felt down, depressed or hopeless?: no Over the past 2 weeks, have you felt little interest or pleasure in doing things?:no   Gynecologic History Patient's last menstrual period was 06/21/2021. Contraception: OCP (estrogen/progesterone) Last Pap: 2022. Results were: normal Last mammogram: 11-19-2020. Results were: normal  Obstetric History OB History  Gravida Para Term Preterm AB Living  '2 1 1   1 1  '$ SAB IAB Ectopic Multiple Live Births    1     1    # Outcome Date GA Lbr Len/2nd Weight Sex Delivery Anes PTL Lv  2 Term 07/22/01 [redacted]w[redacted]d 7 lb (3.175 kg) F CS-LTranv EPI N LIV  1 IAB             Past Medical History:  Diagnosis Date   Chronic insomnia 11/08/2016   Dyspareunia    Essential hypertension, benign    Genital herpes, unspecified    Headache syndrome 11/08/2016   Irregular menstrual cycle    Lumbar spondylosis    Meralgia paraesthetica, right 09/10/2020   Neuralgia and neuritis, unspecified    Surveillance of previously prescribed contraceptive pill     Past Surgical History:  Procedure Laterality Date   BREAST REDUCTION SURGERY  2005   CESAREAN  SECTION  2003     Current Outpatient Medications:    amLODIPine-Valsartan-HCTZ 10-160-12.5 MG TABS, Take by mouth., Disp: , Rfl:    norgestimate-ethinyl estradiol (ORTHO-CYCLEN) 0.25-35 MG-MCG tablet, Take 1 tablet by mouth daily., Disp: , Rfl:    valACYclovir (VALTREX) 500 MG tablet, Take 1 tablet (500 mg total) by mouth 2 (two) times daily. Take for 3 days prn each occurrence., Disp: 30 tablet, Rfl: 5   gabapentin (NEURONTIN) 100 MG capsule, Take 2 capsules (200 mg total) by mouth at bedtime. (Patient not taking: Reported on 07/12/2021), Disp: 60 capsule, Rfl: 3   hydrochlorothiazide (HYDRODIURIL) 25 MG tablet, Take 1 tablet (25 mg total) by mouth daily. (Patient not taking: Reported on 07/12/2021), Disp: 30 tablet, Rfl: 11   ibuprofen (ADVIL) 800 MG tablet, Take 1 tablet (800 mg total) by mouth every 8 (eight) hours as needed., Disp: 30 tablet, Rfl: 5   ondansetron (ZOFRAN-ODT) 4 MG disintegrating tablet, Take 1 tablet (4 mg total) by mouth every 8 (eight) hours as needed for nausea or vomiting. (Patient not taking: Reported on 07/12/2021), Disp: 15 tablet, Rfl: 0   Phentermine HCl 8 MG TABS, Take 2 tablets (16 mg) daily at 11 am-12 pm.  After two weeks, may add 1/2 tablet (4 mg) at 4pm (Patient not taking: Reported on 07/12/2021), Disp: , Rfl:    TRI-LO-MILI 0.18/0.215/0.25 MG-25 MCG tab, TAKE 1 TABLET BY MOUTH DAILY, Disp: 28  tablet, Rfl: 11 Allergies  Allergen Reactions   Sulfa Antibiotics Shortness Of Breath    Social History   Tobacco Use   Smoking status: Never   Smokeless tobacco: Never  Substance Use Topics   Alcohol use: Yes    Alcohol/week: 0.0 standard drinks    Comment: rare- once every 3 months    Family History  Problem Relation Age of Onset   Hypertension Mother    Diabetes Mother    Hyperlipidemia Mother    Arthritis Mother    Hypertension Father    Diabetes Father    Arthritis Sister    Diabetes Sister    Arthritis Maternal Grandmother    Arthritis Paternal  Grandmother    Hypertension Paternal Grandmother    Congenital heart disease Other    Colon cancer Neg Hx    Rectal cancer Neg Hx    Esophageal cancer Neg Hx    Stomach cancer Neg Hx    Liver cancer Neg Hx       Review of Systems  Constitutional: negative for fatigue and weight loss Respiratory: negative for cough and wheezing Cardiovascular: negative for chest pain, fatigue and palpitations Gastrointestinal: negative for abdominal pain and change in bowel habits Musculoskeletal:negative for myalgias Neurological: negative for gait problems and tremors Behavioral/Psych: negative for abusive relationship, depression Endocrine: negative for temperature intolerance    Genitourinary: positive for vaginal discharge.  negative for abnormal menstrual periods, genital lesions, hot flashes, sexual problems  Integument/breast: negative for breast lump, breast tenderness, nipple discharge and skin lesion(s)    Objective:       BP 126/84   Pulse 72   Ht '5\' 3"'$  (1.6 m)   Wt 210 lb (95.3 kg)   LMP 06/21/2021   BMI 37.20 kg/m  General:   Alert and no distress  Skin:   no rash or abnormalities  Lungs:   clear to auscultation bilaterally  Heart:   regular rate and rhythm, S1, S2 normal, no murmur, click, rub or gallop  Breasts:   normal without suspicious masses, skin or nipple changes or axillary nodes  Abdomen:  normal findings: no organomegaly, soft, non-tender and no hernia  Pelvis:  External genitalia: normal general appearance Urinary system: urethral meatus normal and bladder without fullness, nontender Vaginal: normal without tenderness, induration or masses Cervix: normal appearance Adnexa: normal bimanual exam Uterus: anteverted and non-tender, normal size   Lab Review Urine pregnancy test Labs reviewed yes Radiologic studies reviewed yes  I have spent a total of 20 minutes of face-to-face time, excluding clinical staff time, reviewing notes and preparing to see patient,  ordering tests and/or medications, and counseling the patient.   Assessment:    1. Encounter for gynecological examination with Papanicolaou smear of cervix Rx: - Cytology - PAP( [name])  2. Vaginal discharge Rx: - Cervicovaginal ancillary only( [facility])  3. Screening for STD (sexually transmitted disease) Rx: - HIV Antibody (routine testing w rflx) - Hepatitis B surface antigen - RPR - Hepatitis C antibody  4. Encounter for surveillance of contraceptive pills - doing well   5. BV (bacterial vaginosis) Rx: - metroNIDAZOLE (METROGEL VAGINAL) 0.75 % vaginal gel; Place 1 Applicatorful vaginally 2 (two) times daily.  Dispense: 70 g; Refill: 5  6. Candida vaginitis Rx: - fluconazole (DIFLUCAN) 200 MG tablet; Take 1 tablet (200 mg total) by mouth every 3 (three) days.  Dispense: 3 tablet; Refill: 4  7. Genital herpes simplex, unspecified site Rx: - norgestimate-ethinyl estradiol (ORTHO-CYCLEN) 0.25-35 MG-MCG tablet;  Take 1 tablet by mouth daily.  Dispense: 28 tablet; Refill: 11  8. Dysmenorrhea Rx: - ibuprofen (ADVIL) 800 MG tablet; Take 1 tablet (800 mg total) by mouth every 8 (eight) hours as needed.  Dispense: 30 tablet; Refill: 5  9. Obesity (BMI 35.0-39.9 without comorbidity) - weight reduction with the aid of dietary changes, exercise and behavioral modification recommended  10. HTN (hypertension), benign - clinically stable.  Managed by PCP.      Plan:    Education reviewed: calcium supplements, depression evaluation, low fat, low cholesterol diet, safe sex/STD prevention, self breast exams, and weight bearing exercise. Contraception: OCP (estrogen/progesterone). Follow up in: 1 year.   No orders of the defined types were placed in this encounter.  Orders Placed This Encounter  Procedures   HIV Antibody (routine testing w rflx)   Hepatitis B surface antigen   RPR   Hepatitis C antibody      [name], MD 07/12/2021 1:33 PM

## 2021-07-13 ENCOUNTER — Ambulatory Visit: Payer: No Typology Code available for payment source | Admitting: Obstetrics

## 2021-07-13 LAB — HEPATITIS C ANTIBODY: Hep C Virus Ab: NONREACTIVE

## 2021-07-13 LAB — CERVICOVAGINAL ANCILLARY ONLY
Bacterial Vaginitis (gardnerella): NEGATIVE
Candida Glabrata: NEGATIVE
Candida Vaginitis: NEGATIVE
Chlamydia: NEGATIVE
Comment: NEGATIVE
Comment: NEGATIVE
Comment: NEGATIVE
Comment: NEGATIVE
Comment: NEGATIVE
Comment: NORMAL
Neisseria Gonorrhea: NEGATIVE
Trichomonas: NEGATIVE

## 2021-07-13 LAB — RPR: RPR Ser Ql: NONREACTIVE

## 2021-07-13 LAB — HIV ANTIBODY (ROUTINE TESTING W REFLEX): HIV Screen 4th Generation wRfx: NONREACTIVE

## 2021-07-13 LAB — HEPATITIS B SURFACE ANTIGEN: Hepatitis B Surface Ag: NEGATIVE

## 2021-07-14 LAB — CYTOLOGY - PAP
Comment: NEGATIVE
Diagnosis: NEGATIVE
High risk HPV: NEGATIVE

## 2021-07-19 ENCOUNTER — Ambulatory Visit (INDEPENDENT_AMBULATORY_CARE_PROVIDER_SITE_OTHER): Payer: No Typology Code available for payment source

## 2021-07-19 ENCOUNTER — Other Ambulatory Visit: Payer: Self-pay | Admitting: Obstetrics

## 2021-07-19 ENCOUNTER — Encounter: Payer: Self-pay | Admitting: Physician Assistant

## 2021-07-19 ENCOUNTER — Telehealth (HOSPITAL_COMMUNITY): Payer: Self-pay | Admitting: Radiology

## 2021-07-19 ENCOUNTER — Ambulatory Visit (INDEPENDENT_AMBULATORY_CARE_PROVIDER_SITE_OTHER): Payer: No Typology Code available for payment source | Admitting: Physician Assistant

## 2021-07-19 VITALS — BP 130/80 | HR 78 | Ht 63.0 in | Wt 215.4 lb

## 2021-07-19 DIAGNOSIS — R079 Chest pain, unspecified: Secondary | ICD-10-CM

## 2021-07-19 DIAGNOSIS — Z6838 Body mass index (BMI) 38.0-38.9, adult: Secondary | ICD-10-CM

## 2021-07-19 DIAGNOSIS — Z3041 Encounter for surveillance of contraceptive pills: Secondary | ICD-10-CM

## 2021-07-19 DIAGNOSIS — R002 Palpitations: Secondary | ICD-10-CM | POA: Diagnosis not present

## 2021-07-19 DIAGNOSIS — I10 Essential (primary) hypertension: Secondary | ICD-10-CM | POA: Diagnosis not present

## 2021-07-19 DIAGNOSIS — E6609 Other obesity due to excess calories: Secondary | ICD-10-CM | POA: Diagnosis not present

## 2021-07-19 MED ORDER — NORGESTIM-ETH ESTRAD TRIPHASIC 0.18/0.215/0.25 MG-25 MCG PO TABS: 1.0000 | ORAL_TABLET | Freq: Every day | ORAL | 11 refills | Status: DC

## 2021-07-19 NOTE — Telephone Encounter (Signed)
Patient given detailed instructions per Myocardial Perfusion Study Information Sheet for the test on 07/21/2021 at 7:15. Patient notified to arrive 15 minutes early and that it is imperative to arrive on time for appointment to keep from having the test rescheduled.  If you need to cancel or reschedule your appointment, please call the office within 24 hours of your appointment. . Patient verbalized understanding.EHK

## 2021-07-19 NOTE — Patient Instructions (Signed)
Medication Instructions:  Your physician recommends that you continue on your current medications as directed. Please refer to the Current Medication list given to you today.  *If you need a refill on your cardiac medications before your next appointment, please call your pharmacy*   Lab Work: None If you have labs (blood work) drawn today and your tests are completely normal, you will receive your results only by: [place] (if you have MyChart) OR A paper copy in the mail If you have any lab test that is abnormal or we need to change your treatment, we will call you to review the results.   Testing/Procedures: Your physician has requested that you have a lexiscan myoview. For further information please visit [email or web address]. Please follow instruction sheet, as given.   Follow-Up: At [facility], you and your health needs are our priority.  As part of our continuing mission to provide you with exceptional heart care, we have created designated Provider Care Teams.  These Care Teams include your primary Cardiologist (physician) and Advanced Practice Providers (APPs -  Physician Assistants and Nurse Practitioners) who all work together to provide you with the care you need, when you need it.  We recommend signing up for the patient portal called "MyChart".  Sign up information is provided on this After Visit Summary.  MyChart is used to connect with patients for Virtual Visits (Telemedicine).  Patients are able to view lab/test results, encounter notes, upcoming appointments, etc.  Non-urgent messages can be sent to your provider as well.   To learn more about what you can do with MyChart, go to [email or web address].    Your next appointment:   1 year(s)  The format for your next appointment:   In Person  Provider:   Candee Furbish, MD     Other Instructions  ZIO XT- Long Term Monitor Instructions  Your physician has requested you wear a ZIO patch monitor for 14  days.  This is a single patch monitor. [employer] supplies one patch monitor per enrollment. Additional stickers are not available. Please do not apply patch if you will be having a Nuclear Stress Test,  Echocardiogram, Cardiac CT, MRI, or Chest Xray during the period you would be wearing the  monitor. The patch cannot be worn during these tests. You cannot remove and re-apply the  ZIO XT patch monitor.  Your ZIO patch monitor will be mailed 3 day USPS to your address on file. It may take 3-5 days  to receive your monitor after you have been enrolled.  Once you have received your monitor, please review the enclosed instructions. Your monitor  has already been registered assigning a specific monitor serial # to you.  Billing and Patient Assistance Program Information  We have supplied [employer] with any of your insurance information on file for billing purposes. [employer] offers a sliding scale Patient Assistance Program for patients that do not have  insurance, or whose insurance does not completely cover the cost of the ZIO monitor.  You must apply for the Patient Assistance Program to qualify for this discounted rate.  To apply, please call [employer] at [phone], select option 4, select option 2, ask to apply for  Patient Assistance Program. Theodore Demark will ask your household income, and how many people  are in your household. They will quote your out-of-pocket cost based on that information.  [employer] will also be able to set up a 58-month interest-free payment plan if needed.  Applying the monitor   Shave hair from  upper left chest.  Hold abrader disc by orange tab. Rub abrader in 40 strokes over the upper left chest as  indicated in your monitor instructions.  Clean area with 4 enclosed alcohol pads. Let dry.  Apply patch as indicated in monitor instructions. Patch will be placed under collarbone on left  side of chest with arrow pointing upward.  Rub patch adhesive wings for 2 minutes.  Remove white label marked "1". Remove the white  label marked "2". Rub patch adhesive wings for 2 additional minutes.  While looking in a mirror, press and release button in center of patch. A small green light will  flash 3-4 times. This will be your only indicator that the monitor has been turned on.  Do not shower for the first 24 hours. You may shower after the first 24 hours.  Press the button if you feel a symptom. You will hear a small click. Record Date, Time and  Symptom in the Patient Logbook.  When you are ready to remove the patch, follow instructions on the last 2 pages of Patient  Logbook. Stick patch monitor onto the last page of Patient Logbook.  Place Patient Logbook in the blue and white box. Use locking tab on box and tape box closed  securely. The blue and white box has prepaid postage on it. Please place it in the mailbox as  soon as possible. Your physician should have your test results approximately 7 days after the  monitor has been mailed back to [facility].  Call [facility] at [phone] if you have questions regarding  your ZIO XT patch monitor. Call them immediately if you see an orange light blinking on your  monitor.  If your monitor falls off in less than 4 days, contact our Monitor department at [phone].  If your monitor becomes loose or falls off after 4 days call [employer] at [phone] for  suggestions on securing your monitor

## 2021-07-19 NOTE — Progress Notes (Unsigned)
Enrolled patient for a 14 day Zio XT monitor to be mailed to patients home   Dr Skains to read 

## 2021-07-21 ENCOUNTER — Ambulatory Visit (HOSPITAL_COMMUNITY): Payer: No Typology Code available for payment source | Attending: Internal Medicine

## 2021-07-21 DIAGNOSIS — R079 Chest pain, unspecified: Secondary | ICD-10-CM | POA: Diagnosis present

## 2021-07-21 DIAGNOSIS — Z6838 Body mass index (BMI) 38.0-38.9, adult: Secondary | ICD-10-CM | POA: Diagnosis present

## 2021-07-21 DIAGNOSIS — I10 Essential (primary) hypertension: Secondary | ICD-10-CM | POA: Diagnosis present

## 2021-07-21 DIAGNOSIS — R002 Palpitations: Secondary | ICD-10-CM | POA: Diagnosis not present

## 2021-07-21 DIAGNOSIS — E6609 Other obesity due to excess calories: Secondary | ICD-10-CM | POA: Diagnosis present

## 2021-07-21 LAB — MYOCARDIAL PERFUSION IMAGING
Angina Index: 0
Estimated workload: 10.1
Exercise duration (min): 9 min
Exercise duration (sec): 30 s
LV dias vol: 86 mL (ref 46–106)
LV sys vol: 33 mL
MPHR: 176 {beats}/min
Nuc Stress EF: 62 %
Peak HR: 157 {beats}/min
Percent HR: 89 %
Rest HR: 68 {beats}/min
Rest Nuclear Isotope Dose: 10.9 mCi
SDS: 3
SRS: 2
SSS: 5
Stress Nuclear Isotope Dose: 31.9 mCi
TID: 1.07

## 2021-07-21 MED ORDER — TECHNETIUM TC 99M TETROFOSMIN IV KIT
31.9000 | PACK | Freq: Once | INTRAVENOUS | Status: AC | PRN
  Administered 2021-07-21: 31.9 via INTRAVENOUS

## 2021-07-21 MED ORDER — TECHNETIUM TC 99M TETROFOSMIN IV KIT
10.9000 | PACK | Freq: Once | INTRAVENOUS | Status: AC | PRN
  Administered 2021-07-21: 10.9 via INTRAVENOUS

## 2021-07-22 DIAGNOSIS — R002 Palpitations: Secondary | ICD-10-CM

## 2021-07-27 ENCOUNTER — Ambulatory Visit: Payer: No Typology Code available for payment source | Admitting: Podiatry

## 2021-07-27 ENCOUNTER — Encounter (HOSPITAL_COMMUNITY): Payer: No Typology Code available for payment source

## 2021-07-28 ENCOUNTER — Ambulatory Visit (INDEPENDENT_AMBULATORY_CARE_PROVIDER_SITE_OTHER): Payer: No Typology Code available for payment source | Admitting: Podiatry

## 2021-07-28 ENCOUNTER — Ambulatory Visit (INDEPENDENT_AMBULATORY_CARE_PROVIDER_SITE_OTHER): Payer: No Typology Code available for payment source

## 2021-07-28 ENCOUNTER — Telehealth: Payer: Self-pay | Admitting: Podiatry

## 2021-07-28 DIAGNOSIS — M722 Plantar fascial fibromatosis: Secondary | ICD-10-CM | POA: Diagnosis not present

## 2021-07-28 MED ORDER — TRIAMCINOLONE ACETONIDE 10 MG/ML IJ SUSP
10.0000 mg | Freq: Once | INTRAMUSCULAR | Status: AC
  Administered 2021-07-28: 10 mg

## 2021-07-28 NOTE — Patient Instructions (Signed)

## 2021-07-28 NOTE — Telephone Encounter (Signed)
Patient would like information about the EPAT sent to her by email please   Email address:  [email or web address]

## 2021-07-31 NOTE — Progress Notes (Signed)
Subjective:   Patient ID: Amanda Cox, female   DOB: 46 y.o.   MRN: [id number]   HPI 46 year old female presents the office today with concerns of bilateral heel pain, Planter fasciitis.  She states this started around [facility].  She has pain in the bottom of her heel.  No recent injuries that she reports.  Describes the throbbing, aching sensation.  Injections of helped previously and she would like to proceed with injections today as well.   Review of Systems  All other systems reviewed and are negative.  Past Medical History:  Diagnosis Date   Chronic insomnia 11/08/2016   Dyspareunia    Essential hypertension, benign    Genital herpes, unspecified    Headache syndrome 11/08/2016   Irregular menstrual cycle    Lumbar spondylosis    Meralgia paraesthetica, right 09/10/2020   Neuralgia and neuritis, unspecified    Surveillance of previously prescribed contraceptive pill     Past Surgical History:  Procedure Laterality Date   BREAST REDUCTION SURGERY  2005   CESAREAN SECTION  2003     Current Outpatient Medications:    amLODIPine-Valsartan-HCTZ 10-160-12.5 MG TABS, Take by mouth., Disp: , Rfl:    fluconazole (DIFLUCAN) 200 MG tablet, Take 1 tablet (200 mg total) by mouth every 3 (three) days., Disp: 3 tablet, Rfl: 4   gabapentin (NEURONTIN) 100 MG capsule, Take 2 capsules (200 mg total) by mouth at bedtime. (Patient not taking: Reported on 07/19/2021), Disp: 60 capsule, Rfl: 3   hydrochlorothiazide (HYDRODIURIL) 25 MG tablet, Take 1 tablet (25 mg total) by mouth daily. (Patient not taking: Reported on 07/19/2021), Disp: 30 tablet, Rfl: 11   ibuprofen (ADVIL) 800 MG tablet, Take 1 tablet (800 mg total) by mouth every 8 (eight) hours as needed., Disp: 30 tablet, Rfl: 5   metroNIDAZOLE (METROGEL VAGINAL) 0.75 % vaginal gel, Place 1 Applicatorful vaginally 2 (two) times daily., Disp: 70 g, Rfl: 5   Norgestimate-Ethinyl Estradiol Triphasic (TRI-LO-MILI) 0.18/0.215/0.25  MG-25 MCG tab, Take 1 tablet by mouth daily., Disp: 28 tablet, Rfl: 11   ondansetron (ZOFRAN-ODT) 4 MG disintegrating tablet, Take 1 tablet (4 mg total) by mouth every 8 (eight) hours as needed for nausea or vomiting. (Patient not taking: Reported on 07/19/2021), Disp: 15 tablet, Rfl: 0   Phentermine HCl 8 MG TABS, , Disp: , Rfl:    valACYclovir (VALTREX) 500 MG tablet, Take 1 tablet (500 mg total) by mouth 2 (two) times daily. Take for 3 days prn each occurrence., Disp: 30 tablet, Rfl: 5  Allergies  Allergen Reactions   Sulfa Antibiotics Shortness Of Breath          Objective:  Physical Exam  General: AAO x3, NAD  Dermatological: Skin is warm, dry and supple bilateral.  There are no open sores, no preulcerative lesions, no rash or signs of infection present.  Vascular: Dorsalis Pedis artery and Posterior Tibial artery pedal pulses are 2/4 bilateral with immedate capillary fill time.  There is no pain with calf compression, swelling, warmth, erythema.   Neruologic: Grossly intact via light touch bilateral.  Negative Tinel sign.  Musculoskeletal: Tenderness to palpation along the plantar medial tubercle of the calcaneus at the insertion of plantar fascia on the left and right foot. There is no pain along the course of the plantar fascia within the arch of the foot. Plantar fascia appears to be intact. There is no pain with lateral compression of the calcaneus or pain with vibratory sensation. There is minimal pain along  the course or insertion of the achilles tendon. No other areas of tenderness to bilateral lower extremities.Muscular strength 5/5 in all groups tested bilateral.  Gait: Unassisted, Nonantalgic.       Assessment:   Bilateral heel pain, Planter fasciitis     Plan:  -Treatment options discussed including all alternatives, risks, and complications -Etiology of symptoms were discussed -X-rays were obtained and reviewed with the patient.  No evidence of acute fracture.   Mild calcaneal spurring present inferior left heel. -Steroid injection performed bilaterally.  See procedure note below. -We discussed stretching, icing daily.  Discussed shoe modifications with good arch support. -Plantar fascial brace dispensed x2 to help aid in support and stability. -Discussed EPAT-she left without the pressure today so we emailed her the pressure at her request. -Discussed antiperspirant for sweaty feet that she brought this up as well today.  Procedure: Injection Tendon/Ligament Discussed alternatives, risks, complications and verbal consent was obtained.  Location: Bilateral plantar fascia at the glabrous junction; medial approach. Skin Prep: Alcohol. Injectate: 0.5cc 0.5% marcaine plain, 0.5 cc 2% lidocaine plain and, 1 cc kenalog 10. Disposition: Patient tolerated procedure well. Injection site dressed with a band-aid.  Post-injection care was discussed and return precautions discussed.    Trula Slade DPM

## 2021-08-11 ENCOUNTER — Other Ambulatory Visit: Payer: Self-pay | Admitting: Internal Medicine

## 2021-08-11 DIAGNOSIS — Z1231 Encounter for screening mammogram for malignant neoplasm of breast: Secondary | ICD-10-CM

## 2021-09-03 ENCOUNTER — Encounter (HOSPITAL_COMMUNITY): Payer: Self-pay

## 2021-09-03 ENCOUNTER — Ambulatory Visit (HOSPITAL_COMMUNITY)
Admission: EM | Admit: 2021-09-03 | Discharge: 2021-09-03 | Disposition: A | Discharge status: Home / Self Care | Payer: No Typology Code available for payment source | Attending: Student | Admitting: Student

## 2021-09-03 DIAGNOSIS — L089 Local infection of the skin and subcutaneous tissue, unspecified: Secondary | ICD-10-CM

## 2021-09-03 DIAGNOSIS — L729 Follicular cyst of the skin and subcutaneous tissue, unspecified: Secondary | ICD-10-CM

## 2021-09-03 MED ORDER — CEPHALEXIN 500 MG PO CAPS: 500.0000 mg | ORAL_CAPSULE | Freq: Four times a day (QID) | ORAL | 0 refills | Status: DC

## 2021-09-03 NOTE — Discharge Instructions (Addendum)
-  Start the antibiotic: Keflex, 4x daily x5 days. You can take this with food if you have a sensitive stomach. -Warm bath or shower  -If the infection gets bigger and comes to a head (like a large zit) then follow-up with [place] or derm - we may need to drain the cyst at that point -If symptoms persist follow-up with derm

## 2021-09-03 NOTE — ED Triage Notes (Signed)
Pt c/o bumps on her back x 1 year. She reports some swelling and tender to touch.

## 2021-09-03 NOTE — ED Provider Notes (Signed)
[facility]    CSN: [id number] Arrival date & time: 09/03/21  1014      History   Chief Complaint Chief Complaint  Patient presents with   bumps    HPI Amanda Cox is a 46 y.o. female presenting with bumps on her back for 1 year.  History back cysts in the past, states she has followed with Derm for this but they told her it was not a big deal.  She states that the cyst on the left upper back seems to be getting larger and more painful, especially when the bra strap rubs on it.  Denies drainage from the area.  Denies fevers.  Has not followed with Derm since the cysts have become inflamed.  HPI  Past Medical History:  Diagnosis Date   Chronic insomnia 11/08/2016   Dyspareunia    Essential hypertension, benign    Genital herpes, unspecified    Headache syndrome 11/08/2016   Irregular menstrual cycle    Lumbar spondylosis    Meralgia paraesthetica, right 09/10/2020   Neuralgia and neuritis, unspecified    Surveillance of previously prescribed contraceptive pill     Patient Active Problem List   Diagnosis Date Noted   Meralgia paraesthetica, right 09/10/2020   Routine general medical examination at a health care facility 08/02/2018   Constipation 04/11/2018   Chronic insomnia 11/08/2016   Snoring 08/19/2013   Obesity, unspecified 08/19/2013   Essential hypertension, benign 07/10/2013   Genital herpes 07/17/2012    Past Surgical History:  Procedure Laterality Date   BREAST REDUCTION SURGERY  2005   CESAREAN SECTION  2003    OB History     Gravida  2   Para  1   Term  1   Preterm      AB  1   Living  1      SAB      IAB  1   Ectopic      Multiple      Live Births  1            Home Medications    Prior to Admission medications   Medication Sig Start Date End Date Taking? Authorizing Provider  cephALEXin (KEFLEX) 500 MG capsule Take 1 capsule (500 mg total) by mouth 4 (four) times daily. 09/03/21  Yes Hazel Sams, PA-C  amLODIPine-Valsartan-HCTZ 10-160-12.5 MG TABS Take by mouth.    [provider]  fluconazole (DIFLUCAN) 200 MG tablet Take 1 tablet (200 mg total) by mouth every 3 (three) days. 07/12/21   [name], MD  ibuprofen (ADVIL) 800 MG tablet Take 1 tablet (800 mg total) by mouth every 8 (eight) hours as needed. 07/12/21   [name], MD  metroNIDAZOLE (METROGEL VAGINAL) 0.75 % vaginal gel Place 1 Applicatorful vaginally 2 (two) times daily. 07/12/21   [name], MD  Norgestimate-Ethinyl Estradiol Triphasic (TRI-LO-MILI) 0.18/0.215/0.25 MG-25 MCG tab Take 1 tablet by mouth daily. 07/19/21   [name], MD  valACYclovir (VALTREX) 500 MG tablet Take 1 tablet (500 mg total) by mouth 2 (two) times daily. Take for 3 days prn each occurrence. 06/23/21   [name], MD    Family History Family History  Problem Relation Age of Onset   Hypertension Mother    Diabetes Mother    Hyperlipidemia Mother    Arthritis Mother    Hypertension Father    Diabetes Father    Arthritis Sister    Diabetes Sister  Arthritis Maternal Grandmother    Arthritis Paternal Grandmother    Hypertension Paternal Grandmother    Congenital heart disease Other    Colon cancer Neg Hx    Rectal cancer Neg Hx    Esophageal cancer Neg Hx    Stomach cancer Neg Hx    Liver cancer Neg Hx     Social History Social History   Tobacco Use   Smoking status: Never   Smokeless tobacco: Never  Vaping Use   Vaping Use: Never used  Substance Use Topics   Alcohol use: Yes    Alcohol/week: 0.0 standard drinks of alcohol    Comment: rare- once every 3 months   Drug use: No     Allergies   Sulfa antibiotics   Review of Systems Review of Systems  Skin:  Positive for color change.  All other systems reviewed and are negative.    Physical Exam Triage Vital Signs ED Triage Vitals [09/03/21 1032]  Enc Vitals Group     BP 126/82     Pulse Rate 79     Resp 16     Temp 99.5  F (37.5 C)     Temp Source Oral     SpO2 98 %     Weight      Height      Head Circumference      Peak Flow      Pain Score      Pain Loc      Pain Edu?      Excl. in [place]?    No data found.  Updated Vital Signs BP 126/82 (BP Location: Left Arm)   Pulse 79   Temp 99.5 F (37.5 C) (Oral)   Resp 16   SpO2 98%   Visual Acuity Right Eye Distance:   Left Eye Distance:   Bilateral Distance:    Right Eye Near:   Left Eye Near:    Bilateral Near:     Physical Exam Vitals reviewed.  Constitutional:      General: She is not in acute distress.    Appearance: Normal appearance. She is not ill-appearing.  HENT:     Head: Normocephalic and atraumatic.  Pulmonary:     Effort: Pulmonary effort is normal.  Skin:    Comments: L thoracic back with one small 1.38m cyst, mobile, tender to palpation. No erythema, warmth, induration, fluctuance. R thoracic back with one small 42mcyst, mobile, nontender.   Neurological:     General: No focal deficit present.     Mental Status: She is alert and oriented to person, place, and time.  Psychiatric:        Mood and Affect: Mood normal.        Behavior: Behavior normal.        Thought Content: Thought content normal.        Judgment: Judgment normal.      UC Treatments / Results  Labs (all labs ordered are listed, but only abnormal results are displayed) Labs Reviewed - No data to display  EKG   Radiology No results found.  Procedures Procedures (including critical care time)  Medications Ordered in UC Medications - No data to display  Initial Impression / Assessment and Plan / UC Course  I have reviewed the triage vital signs and the nursing notes.  Pertinent labs & imaging results that were available during my care of the patient were reviewed by me and considered in my medical decision making (see chart for  details).     This patient is a very pleasant 46 y.o. year old female presenting with infected cyst. Afebrile,  nontachy. The cyst is immature and not ready for drainage. Keflex sent. She is already followed by [facility] derm - f/u with them if symptoms worsen/persist .ED return precautions discussed. Patient verbalizes understanding and agreement.    Final Clinical Impressions(s) / UC Diagnoses   Final diagnoses:  Infected cyst of skin     Discharge Instructions      -Start the antibiotic: Keflex, 4x daily x5 days. You can take this with food if you have a sensitive stomach. -Warm bath or shower  -If the infection gets bigger and comes to a head (like a large zit) then follow-up with [place] or derm - we may need to drain the cyst at that point -If symptoms persist follow-up with derm      ED Prescriptions     Medication Sig Dispense Auth. Provider   cephALEXin (KEFLEX) 500 MG capsule Take 1 capsule (500 mg total) by mouth 4 (four) times daily. 20 capsule Hazel Sams, PA-C      PDMP not reviewed this encounter.   Hazel Sams, PA-C 09/03/21 1057

## 2021-10-18 ENCOUNTER — Ambulatory Visit (HOSPITAL_COMMUNITY)
Admission: EM | Admit: 2021-10-18 | Discharge: 2021-10-18 | Disposition: A | Discharge status: Home / Self Care | Payer: No Typology Code available for payment source | Attending: Emergency Medicine | Admitting: Emergency Medicine

## 2021-10-18 ENCOUNTER — Encounter (HOSPITAL_COMMUNITY): Payer: Self-pay

## 2021-10-18 DIAGNOSIS — J0100 Acute maxillary sinusitis, unspecified: Secondary | ICD-10-CM

## 2021-10-18 DIAGNOSIS — Z20822 Contact with and (suspected) exposure to covid-19: Secondary | ICD-10-CM | POA: Insufficient documentation

## 2021-10-18 DIAGNOSIS — M25471 Effusion, right ankle: Secondary | ICD-10-CM | POA: Insufficient documentation

## 2021-10-18 LAB — SARS CORONAVIRUS 2 (TAT 6-24 HRS): SARS Coronavirus 2: NEGATIVE

## 2021-10-18 MED ORDER — AZITHROMYCIN 250 MG PO TABS: 250.0000 mg | ORAL_TABLET | Freq: Every day | ORAL | 0 refills | Status: DC

## 2021-10-18 MED ORDER — FUROSEMIDE 20 MG PO TABS: 20.0000 mg | ORAL_TABLET | Freq: Every day | ORAL | 0 refills | Status: DC

## 2021-10-18 NOTE — Discharge Instructions (Signed)
The congestion and pressure to your nose is consistent with a sinusitis and as it has been present for 2 weeks without any relief or resolution we will give bacterial coverage  Take azithromycin as directed on packaging  Continue use of fluticasone nasal spray  As your symptoms may be related to the recent weather change, begin a daily allergy medicine such as Claritin or Zyrtec to see if this will provide you with additional relief  May also use a Mucinex which will help thin any congestion within the sinuses and allow it to drain   Your COVID test is pending, as you have already had upper respiratory symptoms it is hard to determine if you have new COVID symptoms therefore you will need to quarantine for 5 days from your testing today if positive, please let health at work note if positive    For your ankle  There is mild swelling present and as there was no injury we will defer imaging  Take furosemide every morning for 3 days then stop usage, this will pull any additional fluid from your extremities and reduce your swelling, be mindful this medication will call urgency, be close to a bathroom after taking  You may attempt use of compression socks to the ankle to help reduce swelling  You may elevate extremity when sitting and lying to further help reduce swelling  If swelling continues to persist please follow-up with your primary doctor for reevaluation

## 2021-10-18 NOTE — ED Provider Notes (Signed)
[place]    CSN: [id number] Arrival date & time: 10/18/21  1508      History   Chief Complaint Chief Complaint  Patient presents with   Covid Exposure   Nasal Congestion    HPI Amanda Cox is a 46 y.o. female.   Patient presents with a nonproductive cough, persistent burning and tingling sensation to the bilateral nasal turbinates, rhinorrhea, sinus pressure for 2 weeks.  Has been attempting use of fluticasone nasal spray which has been minimally effective.  Endorses a COVID exposure 4 days ago, requesting testing.  No new symptoms since exposure.  Tolerating food and liquids.  Denies shortness of breath, wheezing, ear pain, sore throat.     Patient endorses right ankle swelling that she noticed today while in office.  Denies changes to day-to-day activity and endorses exercise and adequate fluid intake.  Endorses that she also watches her salt intake due to history of hypertension.  Has been taking hypertensive medicines as prescribed and checks blood pressure daily, endorses that is well controlled with systolic being less than 240.  Denies injury or trauma, numbness or tingling.  Able to bear weight.  Range of motion intact.   Past Medical History:  Diagnosis Date   Chronic insomnia 11/08/2016   Dyspareunia    Essential hypertension, benign    Genital herpes, unspecified    Headache syndrome 11/08/2016   Irregular menstrual cycle    Lumbar spondylosis    Meralgia paraesthetica, right 09/10/2020   Neuralgia and neuritis, unspecified    Surveillance of previously prescribed contraceptive pill     Patient Active Problem List   Diagnosis Date Noted   Meralgia paraesthetica, right 09/10/2020   Routine general medical examination at a health care facility 08/02/2018   Constipation 04/11/2018   Chronic insomnia 11/08/2016   Snoring 08/19/2013   Obesity, unspecified 08/19/2013   Essential hypertension, benign 07/10/2013   Genital herpes 07/17/2012     Past Surgical History:  Procedure Laterality Date   BREAST REDUCTION SURGERY  2005   CESAREAN SECTION  2003    OB History     Gravida  2   Para  1   Term  1   Preterm      AB  1   Living  1      SAB      IAB  1   Ectopic      Multiple      Live Births  1            Home Medications    Prior to Admission medications   Medication Sig Start Date End Date Taking? Authorizing Provider  amLODIPine-Valsartan-HCTZ 10-160-12.5 MG TABS Take by mouth.   Yes [provider]  Norgestimate-Ethinyl Estradiol Triphasic (TRI-LO-MILI) 0.18/0.215/0.25 MG-25 MCG tab Take 1 tablet by mouth daily. 07/19/21  Yes [name], MD  cephALEXin (KEFLEX) 500 MG capsule Take 1 capsule (500 mg total) by mouth 4 (four) times daily. 09/03/21   Hazel Sams, PA-C  fluconazole (DIFLUCAN) 200 MG tablet Take 1 tablet (200 mg total) by mouth every 3 (three) days. 07/12/21   [name], MD  ibuprofen (ADVIL) 800 MG tablet Take 1 tablet (800 mg total) by mouth every 8 (eight) hours as needed. 07/12/21   [name], MD  metroNIDAZOLE (METROGEL VAGINAL) 0.75 % vaginal gel Place 1 Applicatorful vaginally 2 (two) times daily. 07/12/21   [name], MD  valACYclovir (VALTREX) 500 MG tablet Take 1  tablet (500 mg total) by mouth 2 (two) times daily. Take for 3 days prn each occurrence. 06/23/21   [name], MD    Family History Family History  Problem Relation Age of Onset   Hypertension Mother    Diabetes Mother    Hyperlipidemia Mother    Arthritis Mother    Hypertension Father    Diabetes Father    Arthritis Sister    Diabetes Sister    Arthritis Maternal Grandmother    Arthritis Paternal Grandmother    Hypertension Paternal Grandmother    Congenital heart disease Other    Colon cancer Neg Hx    Rectal cancer Neg Hx    Esophageal cancer Neg Hx    Stomach cancer Neg Hx    Liver cancer Neg Hx     Social History Social History   Tobacco Use    Smoking status: Never   Smokeless tobacco: Never  Vaping Use   Vaping Use: Never used  Substance Use Topics   Alcohol use: Yes    Alcohol/week: 0.0 standard drinks of alcohol    Comment: rare- once every 3 months   Drug use: No     Allergies   Sulfa antibiotics   Review of Systems Review of Systems Defer to HPI   Physical Exam Triage Vital Signs ED Triage Vitals  Enc Vitals Group     BP 10/18/21 1540 128/81     Pulse Rate 10/18/21 1540 71     Resp 10/18/21 1540 16     Temp 10/18/21 1540 (!) 100.4 F (38 C)     Temp Source 10/18/21 1540 Oral     SpO2 10/18/21 1540 98 %     Weight 10/18/21 1542 208 lb (94.3 kg)     Height 10/18/21 1542 '5\' 3"'$  (1.6 m)     Head Circumference --      Peak Flow --      Pain Score 10/18/21 1542 8     Pain Loc --      Pain Edu? --      Excl. in [place]? --    No data found.  Updated Vital Signs BP 128/81 (BP Location: Right Arm)   Pulse 71   Temp (!) 100.4 F (38 C) (Oral)   Resp 16   Ht '5\' 3"'$  (1.6 m)   Wt 208 lb (94.3 kg)   LMP 10/06/2021 (Approximate)   SpO2 98%   BMI 36.85 kg/m   Visual Acuity Right Eye Distance:   Left Eye Distance:   Bilateral Distance:    Right Eye Near:   Left Eye Near:    Bilateral Near:     Physical Exam Constitutional:      Appearance: Normal appearance.  HENT:     Head: Normocephalic.     Right Ear: Tympanic membrane, ear canal and external ear normal.     Left Ear: Tympanic membrane, ear canal and external ear normal.     Nose: Congestion and rhinorrhea present.     Mouth/Throat:     Mouth: Mucous membranes are moist.     Pharynx: Oropharynx is clear.  Eyes:     Extraocular Movements: Extraocular movements intact.  Cardiovascular:     Rate and Rhythm: Normal rate and regular rhythm.     Pulses: Normal pulses.     Heart sounds: Normal heart sounds.  Pulmonary:     Effort: Pulmonary effort is normal.     Breath sounds: Normal breath sounds.  Musculoskeletal:  Comments: +1 edema  to the right ankle, no tenderness, ecchymosis or deformity present, able to bear weight, 2+ dorsalis pedis pulse, sensation intact  Neurological:     Mental Status: She is alert and oriented to person, place, and time. Mental status is at baseline.  Psychiatric:        Mood and Affect: Mood normal.        Behavior: Behavior normal.      UC Treatments / Results  Labs (all labs ordered are listed, but only abnormal results are displayed) Labs Reviewed - No data to display  EKG   Radiology No results found.  Procedures Procedures (including critical care time)  Medications Ordered in UC Medications - No data to display  Initial Impression / Assessment and Plan / UC Course  I have reviewed the triage vital signs and the nursing notes.  Pertinent labs & imaging results that were available during my care of the patient were reviewed by me and considered in my medical decision making (see chart for details).  Acute nonrecurrent maxillary sinusitis Exposure to COVID-19 virus Edema of right ankle  We will provide coverage as symptoms have been present for 2 weeks, description of sinus pressure and congestion is consistent with a sinusitis, Z-Pak prescribed recommended continued use of fluticasone and beginning use of Mucinex and antihistamine for additional supportive measures  COVID test is pending ,as patient had already had URI symptoms prior to exposure she will need to quarantine for 5 days post testing if positive, discussed, generally she is a healthy middle-aged adult does not qualify for antiviral treatments, may use supportive measures as new symptoms present with follow-up as needed  Unknown cause of edema to the right ankle, endorses that this has occurred before and was treated with a fluid pill with success, furosemide daily for 3 to 5 days prescribed recommended compression socks and elevation for additional supportive measures, if edema continues to persist she is to  follow-up with her primary doctor for further evaluation  Final Clinical Impressions(s) / UC Diagnoses   Final diagnoses:  None   Discharge Instructions   None    ED Prescriptions   None    PDMP not reviewed this encounter.   Hans Eden, NP 10/18/21 1624

## 2021-10-18 NOTE — ED Triage Notes (Signed)
Patient was exposed to Covid on Friday. Did not know that the person was sick at that time.   Patient having burning and tingling in her nose for 2 weeks.

## 2021-11-21 ENCOUNTER — Ambulatory Visit
Admission: RE | Admit: 2021-11-21 | Discharge: 2021-11-21 | Disposition: A | Discharge status: Home / Self Care | Payer: No Typology Code available for payment source | Source: Ambulatory Visit | Attending: Internal Medicine | Admitting: Internal Medicine

## 2021-11-21 ENCOUNTER — Other Ambulatory Visit: Payer: Self-pay

## 2021-11-21 ENCOUNTER — Telehealth: Payer: Self-pay

## 2021-11-21 DIAGNOSIS — A6000 Herpesviral infection of urogenital system, unspecified: Secondary | ICD-10-CM

## 2021-11-21 DIAGNOSIS — Z1231 Encounter for screening mammogram for malignant neoplasm of breast: Secondary | ICD-10-CM

## 2021-11-21 NOTE — Telephone Encounter (Signed)
S/w pt and advised that I s/w pharmacy and rx for valacyclovir is $15, pt agreed.

## 2021-12-18 ENCOUNTER — Other Ambulatory Visit: Payer: Self-pay | Admitting: Obstetrics

## 2021-12-18 DIAGNOSIS — B9689 Other specified bacterial agents as the cause of diseases classified elsewhere: Secondary | ICD-10-CM

## 2022-01-16 ENCOUNTER — Other Ambulatory Visit: Payer: Self-pay | Admitting: *Deleted

## 2022-01-16 NOTE — Progress Notes (Signed)
PA requested for brand name Valtrex per pt request at pharmacy. Pt paid out of pocket for RX on 01/10/22. PA completed via Cover My Meds portal. No documentation found indicating need for brand only Valtrex (ex adverse rxn or incomplete resolution of outbreak). No additional documentation submitted with PA request.

## 2022-01-20 ENCOUNTER — Other Ambulatory Visit: Payer: Self-pay | Admitting: *Deleted

## 2022-01-20 DIAGNOSIS — M7989 Other specified soft tissue disorders: Secondary | ICD-10-CM

## 2022-01-24 ENCOUNTER — Other Ambulatory Visit (HOSPITAL_COMMUNITY): Payer: Self-pay | Admitting: Internal Medicine

## 2022-01-24 DIAGNOSIS — R071 Chest pain on breathing: Secondary | ICD-10-CM

## 2022-01-27 ENCOUNTER — Ambulatory Visit (HOSPITAL_COMMUNITY)
Admission: RE | Admit: 2022-01-27 | Discharge: 2022-01-27 | Disposition: A | Discharge status: Home / Self Care | Payer: No Typology Code available for payment source | Source: Ambulatory Visit | Attending: Vascular Surgery | Admitting: Vascular Surgery

## 2022-01-27 ENCOUNTER — Ambulatory Visit (INDEPENDENT_AMBULATORY_CARE_PROVIDER_SITE_OTHER): Payer: No Typology Code available for payment source | Admitting: Physician Assistant

## 2022-01-27 VITALS — BP 126/83 | HR 67 | Temp 97.9°F | Resp 20 | Ht 63.0 in | Wt 208.0 lb

## 2022-01-27 DIAGNOSIS — I872 Venous insufficiency (chronic) (peripheral): Secondary | ICD-10-CM

## 2022-01-27 DIAGNOSIS — M7989 Other specified soft tissue disorders: Secondary | ICD-10-CM

## 2022-01-27 NOTE — Progress Notes (Signed)
Office Note     CC:  follow up Requesting Provider:  Lennie Odor, PA  HPI: Amanda Cox is a 46 y.o. (16-Oct-1975) female who presents for evaluation of bilateral ankle and foot edema.  Patient states she started noticing an increase in edema now that she works 2 jobs which require sitting at [employer].  She does not wear compression regularly and does not make an effort to elevate her legs.  She denies any history of DVT, venous ulcerations, trauma, or prior vascular interventions.  She is not a smoker.   Past Medical History:  Diagnosis Date   Chronic insomnia 11/08/2016   Dyspareunia    Essential hypertension, benign    Genital herpes, unspecified    Headache syndrome 11/08/2016   Irregular menstrual cycle    Lumbar spondylosis    Meralgia paraesthetica, right 09/10/2020   Neuralgia and neuritis, unspecified    Surveillance of previously prescribed contraceptive pill     Past Surgical History:  Procedure Laterality Date   BREAST REDUCTION SURGERY  2005   CESAREAN SECTION  2003    Social History   Socioeconomic History   Marital status: Single    Spouse name: Not on file   Number of children: 1   Years of education: College   Highest education level: Not on file  Occupational History    Employer: [employer]  Tobacco Use   Smoking status: Never    Passive exposure: Never   Smokeless tobacco: Never  Vaping Use   Vaping Use: Never used  Substance and Sexual Activity   Alcohol use: Yes    Alcohol/week: 0.0 standard drinks of alcohol    Comment: rare- once every 3 months   Drug use: No   Sexual activity: Yes    Partners: Male    Birth control/protection: OCP  Other Topics Concern   Not on file  Social History Narrative   Patient is single and lives at home, her daughter lives with her.   Patient has a college education.   Patient is right-handed.   Caffeine use:  1-2 daily   Patient has one child.   Social Determinants of Health    Financial Resource Strain: Not on file  Food Insecurity: Not on file  Transportation Needs: Not on file  Physical Activity: Not on file  Stress: Not on file  Social Connections: Not on file  Intimate Partner Violence: Not on file    Family History  Problem Relation Age of Onset   Hypertension Mother    Diabetes Mother    Hyperlipidemia Mother    Arthritis Mother    Hypertension Father    Diabetes Father    Arthritis Sister    Diabetes Sister    Arthritis Maternal Grandmother    Arthritis Paternal Grandmother    Hypertension Paternal Grandmother    Congenital heart disease Other    Colon cancer Neg Hx    Rectal cancer Neg Hx    Esophageal cancer Neg Hx    Stomach cancer Neg Hx    Liver cancer Neg Hx     Current Outpatient Medications  Medication Sig Dispense Refill   fluconazole (DIFLUCAN) 200 MG tablet Take 1 tablet (200 mg total) by mouth every 3 (three) days. 3 tablet 4   fluticasone (FLONASE) 50 MCG/ACT nasal spray Place 2 sprays into both nostrils daily.     ibuprofen (ADVIL) 800 MG tablet Take 1 tablet (800 mg total) by mouth every 8 (eight) hours as needed. [place]  tablet 5   metroNIDAZOLE (METROGEL) 0.75 % vaginal gel INSERT 1 APPLICATORFUL VAGINALLY TWICE DAILY 70 g 5   Norgestimate-Ethinyl Estradiol Triphasic (TRI-LO-MILI) 0.18/0.215/0.25 MG-25 MCG tab Take 1 tablet by mouth daily. 28 tablet 11   olmesartan (BENICAR) 40 MG tablet Take 40 mg by mouth daily.     torsemide (DEMADEX) 20 MG tablet Take by mouth.     valACYclovir (VALTREX) 500 MG tablet Take 1 tablet (500 mg total) by mouth 2 (two) times daily. Take for 3 days prn each occurrence. 30 tablet 5   amLODIPine-Valsartan-HCTZ 10-160-12.5 MG TABS Take by mouth. (Patient not taking: Reported on 01/27/2022)     azithromycin (ZITHROMAX) 250 MG tablet Take 1 tablet (250 mg total) by mouth daily. Take first 2 tablets together, then 1 every day until finished. (Patient not taking: Reported on 01/27/2022) 6 tablet 0    cephALEXin (KEFLEX) 500 MG capsule Take 1 capsule (500 mg total) by mouth 4 (four) times daily. (Patient not taking: Reported on 01/27/2022) 20 capsule 0   No current facility-administered medications for this visit.    Allergies  Allergen Reactions   Sulfa Antibiotics Shortness Of Breath     REVIEW OF SYSTEMS:   '[X]'$  denotes positive finding, '[ ]'$  denotes negative finding Cardiac  Comments:  Chest pain or chest pressure:    Shortness of breath upon exertion:    Short of breath when lying flat:    Irregular heart rhythm:        Vascular    Pain in calf, thigh, or hip brought on by ambulation:    Pain in feet at night that wakes you up from your sleep:     Blood clot in your veins:    Leg swelling:         Pulmonary    Oxygen at home:    Productive cough:     Wheezing:         Neurologic    Sudden weakness in arms or legs:     Sudden numbness in arms or legs:     Sudden onset of difficulty speaking or slurred speech:    Temporary loss of vision in one eye:     Problems with dizziness:         Gastrointestinal    Blood in stool:     Vomited blood:         Genitourinary    Burning when urinating:     Blood in urine:        Psychiatric    Major depression:         Hematologic    Bleeding problems:    Problems with blood clotting too easily:        Skin    Rashes or ulcers:        Constitutional    Fever or chills:      PHYSICAL EXAMINATION:  Vitals:   01/27/22 0823  BP: 126/83  Pulse: 67  Resp: 20  Temp: 97.9 F (36.6 C)  TempSrc: Temporal  SpO2: 99%  Weight: 208 lb (94.3 kg)  Height: '5\' 3"'$  (1.6 m)    General:  WDWN in NAD; vital signs documented above Gait: Not observed HENT: WNL, normocephalic Pulmonary: normal non-labored breathing , without Rales, rhonchi,  wheezing Cardiac: regular HR Abdomen: soft, NT, no masses Skin: without rashes Vascular Exam/Pulses:  Right Left  DP 2+ (normal) 2+ (normal)   Extremities: without ischemic  changes, without Gangrene , without cellulitis; without open wounds;  Musculoskeletal: no muscle wasting or atrophy  Neurologic: A&O X 3;  No focal weakness or paresthesias are detected Psychiatric:  The pt has Normal affect.   Non-Invasive Vascular Imaging:   Right lower extremity venous reflux study negative for DVT.  Incompetent common femoral and proximal femoral vein.  Incompetent GSV at the level of the saphenofemoral junction as well as the distal thigh.  Diameter of GSV less than 4 mm throughout most of the thigh    ASSESSMENT/PLAN:: 46 y.o. female here for evaluation of bilateral ankle edema  -Right lower extremity venous reflux study was negative for DVT.  She does have mild deep and superficial venous insufficiency however GSV is small in diameter throughout the thigh.  She would not be a candidate for laser ablation therapy.  Recommendations included 15 to 20 mmHg knee-high compression to be worn daily bilaterally.  We also discussed proper leg elevation to be performed 10 to 15 minutes at a time periodically throughout the day.  She should also avoid prolonged sitting.  Patient can otherwise follow-up on an as-needed basis.   Dagoberto Ligas, PA-C [facility] [phone]  Clinic MD:   Virl Cagey

## 2022-02-03 ENCOUNTER — Ambulatory Visit (HOSPITAL_COMMUNITY)
Admission: RE | Admit: 2022-02-03 | Discharge: 2022-02-03 | Disposition: A | Discharge status: Home / Self Care | Payer: No Typology Code available for payment source | Source: Ambulatory Visit | Attending: Internal Medicine | Admitting: Internal Medicine

## 2022-02-03 DIAGNOSIS — R071 Chest pain on breathing: Secondary | ICD-10-CM

## 2022-02-12 ENCOUNTER — Encounter (HOSPITAL_COMMUNITY): Payer: Self-pay

## 2022-02-12 ENCOUNTER — Emergency Department (HOSPITAL_COMMUNITY)
Admission: EM | Admit: 2022-02-12 | Discharge: 2022-02-12 | Disposition: A | Discharge status: Home / Self Care | Payer: No Typology Code available for payment source | Attending: Emergency Medicine | Admitting: Emergency Medicine

## 2022-02-12 ENCOUNTER — Other Ambulatory Visit: Payer: Self-pay

## 2022-02-12 DIAGNOSIS — Z8616 Personal history of COVID-19: Secondary | ICD-10-CM | POA: Diagnosis not present

## 2022-02-12 DIAGNOSIS — J029 Acute pharyngitis, unspecified: Secondary | ICD-10-CM | POA: Diagnosis present

## 2022-02-12 DIAGNOSIS — J101 Influenza due to other identified influenza virus with other respiratory manifestations: Secondary | ICD-10-CM | POA: Diagnosis not present

## 2022-02-12 DIAGNOSIS — J111 Influenza due to unidentified influenza virus with other respiratory manifestations: Secondary | ICD-10-CM

## 2022-02-12 LAB — CBG MONITORING, ED: Glucose-Capillary: 128 mg/dL — ABNORMAL HIGH (ref 70–99)

## 2022-02-12 LAB — BASIC METABOLIC PANEL
Anion gap: 7 (ref 5–15)
BUN: 10 mg/dL (ref 6–20)
CO2: 22 mmol/L (ref 22–32)
Calcium: 8.7 mg/dL — ABNORMAL LOW (ref 8.9–10.3)
Chloride: 103 mmol/L (ref 98–111)
Creatinine, Ser: 0.88 mg/dL (ref 0.44–1.00)
GFR, Estimated: >60 mL/min (ref >60)
Glucose, Bld: 127 mg/dL — ABNORMAL HIGH (ref 70–99)
Potassium: 4.2 mmol/L (ref 3.5–5.1)
Sodium: 132 mmol/L — ABNORMAL LOW (ref 135–145)

## 2022-02-12 LAB — CBC WITH DIFFERENTIAL/PLATELET
Abs Immature Granulocytes: 0.03 10*3/uL (ref 0.00–0.07)
Basophils Absolute: 0 10*3/uL (ref 0.0–0.1)
Basophils Relative: 1 %
Eosinophils Absolute: 0 10*3/uL (ref 0.0–0.5)
Eosinophils Relative: 1 %
HCT: 35 % — ABNORMAL LOW (ref 36.0–46.0)
Hemoglobin: 11.3 g/dL — ABNORMAL LOW (ref 12.0–15.0)
Immature Granulocytes: 1 %
Lymphocytes Relative: 10 %
Lymphs Abs: 0.5 10*3/uL — ABNORMAL LOW (ref 0.7–4.0)
MCH: 28 pg (ref 26.0–34.0)
MCHC: 32.3 g/dL (ref 30.0–36.0)
MCV: 86.6 fL (ref 80.0–100.0)
Monocytes Absolute: 0.8 10*3/uL (ref 0.1–1.0)
Monocytes Relative: 16 %
Neutro Abs: 3.9 10*3/uL (ref 1.7–7.7)
Neutrophils Relative %: 71 %
Platelets: 302 10*3/uL (ref 150–400)
RBC: 4.04 MIL/uL (ref 3.87–5.11)
RDW: 13.7 % (ref 11.5–15.5)
WBC: 5.3 10*3/uL (ref 4.0–10.5)
nRBC: 0 % (ref 0.0–0.2)

## 2022-02-12 LAB — URINALYSIS, ROUTINE W REFLEX MICROSCOPIC
Bacteria, UA: NONE SEEN
Bilirubin Urine: NEGATIVE
Glucose, UA: NEGATIVE mg/dL
Hgb urine dipstick: NEGATIVE
Ketones, ur: NEGATIVE mg/dL
Leukocytes,Ua: NEGATIVE
Nitrite: NEGATIVE
Protein, ur: 100 mg/dL — AB
Specific Gravity, Urine: 1.025 (ref 1.005–1.030)
pH: 6 (ref 5.0–8.0)

## 2022-02-12 LAB — GROUP A STREP BY PCR: Group A Strep by PCR: NOT DETECTED

## 2022-02-12 LAB — RESP PANEL BY RT-PCR (RSV, FLU A&B, COVID)  RVPGX2
Influenza A by PCR: POSITIVE — AB
Influenza B by PCR: NEGATIVE
Resp Syncytial Virus by PCR: NEGATIVE
SARS Coronavirus 2 by RT PCR: NEGATIVE

## 2022-02-12 MED ORDER — LIDOCAINE VISCOUS HCL 2 % MT SOLN: 5.0000 mL | Freq: Three times a day (TID) | OROMUCOSAL | 0 refills | Status: DC | PRN

## 2022-02-12 MED ORDER — ACETAMINOPHEN 500 MG PO TABS
1000.0000 mg | ORAL_TABLET | Freq: Once | ORAL | Status: AC
  Administered 2022-02-12: 1000 mg via ORAL
  Filled 2022-02-12: qty 2

## 2022-02-12 MED ORDER — PREDNISONE 10 MG PO TABS: 30.0000 mg | ORAL_TABLET | Freq: Every day | ORAL | 0 refills | Status: DC

## 2022-02-12 MED ORDER — IBUPROFEN 400 MG PO TABS
600.0000 mg | ORAL_TABLET | Freq: Once | ORAL | Status: AC
  Administered 2022-02-12: 600 mg via ORAL
  Filled 2022-02-12: qty 1

## 2022-02-12 MED ORDER — PROMETHAZINE-DM 6.25-15 MG/5ML PO SYRP
5.0000 mL | ORAL_SOLUTION | Freq: Four times a day (QID) | ORAL | 0 refills | Status: DC | PRN
Start: 2022-02-12 — End: 2022-02-17

## 2022-02-12 MED ORDER — ONDANSETRON 4 MG PO TBDP: 4.0000 mg | ORAL_TABLET | Freq: Three times a day (TID) | ORAL | 0 refills | Status: DC | PRN

## 2022-02-12 NOTE — ED Triage Notes (Signed)
Pt arrived POV from home c/o cough, sore throat and pt states her mom is sick.

## 2022-02-12 NOTE — ED Provider Triage Note (Signed)
Emergency Medicine Provider Triage Evaluation Note  Amanda Cox , a 46 y.o. female  was evaluated in triage.  Pt complains of sore throat x 2-3 days.  Patient has associated sore throat, rhinorrhea, painful swallowing, chest wall pain with cough, dizziness (she noticed this last night).  Has tried over-the-counter medication for symptoms.  Denies trouble swallowing. Patient notes that she would like to be assess for her dizziness as well as her sore throat and rhinorrhea.    Review of Systems  Positive:  Negative:   Physical Exam  BP 136/84 (BP Location: Right Arm)   Pulse (!) 115   Temp (!) 102.3 F (39.1 C) (Oral)   Resp 16   SpO2 95%  Gen:   Awake, no distress   Resp:  Normal effort  MSK:   Moves extremities without difficulty  Other:  Tachycardic on exam.  Uvula midline without swelling.  No posterior pharyngeal erythema or tonsillar exudate noted.  Patient able to speak in clear complete sentences.  Tolerating oral secretions well.  Medical Decision Making  Medically screening exam initiated at 9:34 AM.  Appropriate orders placed.  Amanda Cox was informed that the remainder of the evaluation will be completed by another provider, this initial triage assessment does not replace that evaluation, and the importance of remaining in the ED until their evaluation is complete.    Amanda Cox A, PA-C 02/12/22 1218

## 2022-02-12 NOTE — Discharge Instructions (Signed)
He does a positive for influenza.  Hydrate, take Tylenol and ibuprofen as discussed below.  I have also given you a low-dose of prednisone to take for the next 4 days  Please use Tylenol or ibuprofen for pain.  You may use 600 mg ibuprofen every 6 hours or 1000 mg of Tylenol every 6 hours.  You may choose to alternate between the 2.  This would be most effective.  Not to exceed 4 g of Tylenol within 24 hours.  Not to exceed 3200 mg ibuprofen 24 hours.   Viral Illness TREATMENT  Treatment is directed at relieving symptoms. There is no cure. Antibiotics are not effective, because the infection is caused by a virus, not by bacteria. Treatment may include:  Increased fluid intake. Sports drinks offer valuable electrolytes, sugars, and fluids.  Breathing heated mist or steam (vaporizer or shower).  Eating chicken soup or other clear broths, and maintaining good nutrition.  Getting plenty of rest.  Using gargles or lozenges for comfort.  Increasing usage of your inhaler if you have asthma.  Return to work when your temperature has returned to normal.  Gargle warm salt water and spit it out for sore throat. Take benadryl to decrease sinus secretions. Continue to alternate between Tylenol and ibuprofen for pain and fever control.  Follow Up: Follow up with your primary care doctor in 5-7 days for recheck of ongoing symptoms.  Return to emergency department for emergent changing or worsening of symptoms.

## 2022-02-12 NOTE — ED Provider Notes (Signed)
[facility] EMERGENCY DEPARTMENT Provider Note   CSN: [id number] Arrival date & time: 02/12/22  1941     History  Chief Complaint  Patient presents with   Sore Throat    Amanda Cox is a 46 y.o. female.   Sore Throat  Patient with sore throat body aches congestion and fatigue for the past approximately 4 days presenting today with the symptoms.  She denies any chest pain or difficulty breathing.  She states she does have some chest discomfort when she coughs hard. No vomiting but has had some nausea.  She endorses lightheadedness at times but does not seem to be exertional lightheadedness or other when she is going from sitting to standing.  No other significant associated symptoms.    Home Medications Prior to Admission medications   Medication Sig Start Date End Date Taking? Authorizing Provider  magic mouthwash (lidocaine, diphenhydrAMINE, alum & mag hydroxide) suspension Swish and swallow 5 mLs 3 (three) times daily as needed for mouth pain. 02/12/22  Yes Haleigh Desmith S, PA  ondansetron (ZOFRAN-ODT) 4 MG disintegrating tablet Take 1 tablet (4 mg total) by mouth every 8 (eight) hours as needed for nausea or vomiting. 02/12/22  Yes Dat Derksen S, PA  predniSONE (DELTASONE) 10 MG tablet Take 3 tablets (30 mg total) by mouth daily. 02/12/22  Yes Edmonia Gonser S, PA  amLODIPine-Valsartan-HCTZ 10-160-12.5 MG TABS Take by mouth. Patient not taking: Reported on 01/27/2022    [provider]  azithromycin (ZITHROMAX) 250 MG tablet Take 1 tablet (250 mg total) by mouth daily. Take first 2 tablets together, then 1 every day until finished. Patient not taking: Reported on 01/27/2022 10/18/21   Hans Eden, NP  cephALEXin (KEFLEX) 500 MG capsule Take 1 capsule (500 mg total) by mouth 4 (four) times daily. Patient not taking: Reported on 01/27/2022 09/03/21   Hazel Sams, PA-C  fluconazole (DIFLUCAN) 200 MG tablet Take 1 tablet (200 mg  total) by mouth every 3 (three) days. 07/12/21   [name], MD  fluticasone (FLONASE) 50 MCG/ACT nasal spray Place 2 sprays into both nostrils daily.    [provider]  ibuprofen (ADVIL) 800 MG tablet Take 1 tablet (800 mg total) by mouth every 8 (eight) hours as needed. 07/12/21   [name], MD  metroNIDAZOLE (METROGEL) 0.75 % vaginal gel INSERT 1 APPLICATORFUL VAGINALLY TWICE DAILY 12/19/21   [name], MD  Norgestimate-Ethinyl Estradiol Triphasic (TRI-LO-MILI) 0.18/0.215/0.25 MG-25 MCG tab Take 1 tablet by mouth daily. 07/19/21   [name], MD  olmesartan (BENICAR) 40 MG tablet Take 40 mg by mouth daily. 01/23/22   [provider]  torsemide (DEMADEX) 20 MG tablet Take by mouth. 01/23/22   [provider]  valACYclovir (VALTREX) 500 MG tablet Take 1 tablet (500 mg total) by mouth 2 (two) times daily. Take for 3 days prn each occurrence. 06/23/21   [name], MD      Allergies    Sulfa antibiotics    Review of Systems   Review of Systems  Physical Exam Updated Vital Signs BP 132/88   Pulse 85   Temp 98.5 F (36.9 C) (Oral)   Resp 16   SpO2 99%  Physical Exam Vitals and nursing note reviewed.  Constitutional:      General: She is not in acute distress.    Appearance: Normal appearance. She is not ill-appearing.  HENT:     Head: Normocephalic and atraumatic.  Nose: Nose normal.     Mouth/Throat:     Mouth: Mucous membranes are dry.  Eyes:     General: No scleral icterus.       Right eye: No discharge.        Left eye: No discharge.     Conjunctiva/sclera: Conjunctivae normal.  Cardiovascular:     Rate and Rhythm: Normal rate and regular rhythm.     Pulses: Normal pulses.     Heart sounds: Normal heart sounds.  Pulmonary:     Effort: Pulmonary effort is normal. No respiratory distress.     Breath sounds: No stridor. No wheezing.  Abdominal:     Palpations: Abdomen is soft.     Tenderness: There is no  abdominal tenderness. There is no guarding or rebound.  Musculoskeletal:     Cervical back: Normal range of motion.     Right lower leg: No edema.     Left lower leg: No edema.  Skin:    General: Skin is warm and dry.     Capillary Refill: Capillary refill takes less than 2 seconds.  Neurological:     Mental Status: She is alert and oriented to person, place, and time. Mental status is at baseline.  Psychiatric:        Mood and Affect: Mood normal.        Behavior: Behavior normal.     ED Results / Procedures / Treatments   Labs (all labs ordered are listed, but only abnormal results are displayed) Labs Reviewed  RESP PANEL BY RT-PCR (RSV, FLU A&B, COVID)  RVPGX2 - Abnormal; Notable for the following components:      Result Value   Influenza A by PCR POSITIVE (*)    All other components within normal limits  BASIC METABOLIC PANEL - Abnormal; Notable for the following components:   Sodium 132 (*)    Glucose, Bld 127 (*)    Calcium 8.7 (*)    All other components within normal limits  CBC WITH DIFFERENTIAL/PLATELET - Abnormal; Notable for the following components:   Hemoglobin 11.3 (*)    HCT 35.0 (*)    Lymphs Abs 0.5 (*)    All other components within normal limits  URINALYSIS, ROUTINE W REFLEX MICROSCOPIC - Abnormal; Notable for the following components:   Protein, ur 100 (*)    All other components within normal limits  CBG MONITORING, ED - Abnormal; Notable for the following components:   Glucose-Capillary 128 (*)    All other components within normal limits  GROUP A STREP BY PCR    EKG None  Radiology No results found.  Procedures Procedures    Medications Ordered in ED Medications  acetaminophen (TYLENOL) tablet 1,000 mg (1,000 mg Oral Given 02/12/22 0942)  ibuprofen (ADVIL) tablet 600 mg (600 mg Oral Given 02/12/22 1147)    ED Course/ Medical Decision Making/ A&P                           Medical Decision Making   Patient with sore throat body  aches congestion and fatigue for the past approximately 4 days presenting today with the symptoms.  She denies any chest pain or difficulty breathing.  She states she does have some chest discomfort when she coughs hard. No vomiting but has had some nausea.  She endorses lightheadedness at times but does not seem to be exertional lightheadedness or other when she is going from sitting to standing.  No other significant associated symptoms.   Patient with reassuring physical exam, is managing secretions, some mild erythema in posterior pharynx but no tonsillar hypertrophy uvula is midline normal phonation.  Vital signs normal here after 1 dose of Tylenol will give a dose of ibuprofen as well recommend that she alternate between the 2 these.  Urinalysis unremarkable apart from some protein which she will need to have recheck by PCP, COVID influenza positive for influenza A.  BMP with mild hyponatremia of 132 otherwise unremarkable CBC with mild anemia of 11.3.  She will also need to have this rechecked.  Patient with influenza.  Will provide Magic mouthwash as her chief complaint is sore throat.  Zofran for nausea prednisone for a brief course.  Hydration follow-up with PCP regarding review of workup here and to recheck and make sure that her symptoms are improving.  Return precautions discussed.  Final Clinical Impression(s) / ED Diagnoses Final diagnoses:  Influenza    Rx / DC Orders ED Discharge Orders          Ordered    magic mouthwash (lidocaine, diphenhydrAMINE, alum & mag hydroxide) suspension  3 times daily PRN        02/12/22 1301    predniSONE (DELTASONE) 10 MG tablet  Daily        02/12/22 1301    ondansetron (ZOFRAN-ODT) 4 MG disintegrating tablet  Every 8 hours PRN        02/12/22 1301              Tedd Sias, [place] 02/12/22 1306    Pattricia Boss, MD 02/15/22 [phone]

## 2022-02-14 ENCOUNTER — Other Ambulatory Visit (HOSPITAL_COMMUNITY): Payer: Self-pay | Admitting: Internal Medicine

## 2022-02-14 DIAGNOSIS — R0602 Shortness of breath: Secondary | ICD-10-CM

## 2022-02-17 ENCOUNTER — Ambulatory Visit (HOSPITAL_COMMUNITY)
Admission: EM | Admit: 2022-02-17 | Discharge: 2022-02-17 | Disposition: A | Discharge status: Home / Self Care | Payer: No Typology Code available for payment source | Attending: Emergency Medicine | Admitting: Emergency Medicine

## 2022-02-17 ENCOUNTER — Encounter (HOSPITAL_COMMUNITY): Payer: Self-pay | Admitting: Emergency Medicine

## 2022-02-17 ENCOUNTER — Ambulatory Visit (INDEPENDENT_AMBULATORY_CARE_PROVIDER_SITE_OTHER): Payer: No Typology Code available for payment source

## 2022-02-17 DIAGNOSIS — R053 Chronic cough: Secondary | ICD-10-CM

## 2022-02-17 DIAGNOSIS — J189 Pneumonia, unspecified organism: Secondary | ICD-10-CM

## 2022-02-17 DIAGNOSIS — R059 Cough, unspecified: Secondary | ICD-10-CM | POA: Diagnosis not present

## 2022-02-17 MED ORDER — ALBUTEROL SULFATE HFA 108 (90 BASE) MCG/ACT IN AERS: 2.0000 | INHALATION_SPRAY | Freq: Four times a day (QID) | RESPIRATORY_TRACT | 2 refills | Status: DC

## 2022-02-17 MED ORDER — PREDNISONE 10 MG (21) PO TBPK: ORAL_TABLET | Freq: Every day | ORAL | 0 refills | Status: DC

## 2022-02-17 MED ORDER — AZITHROMYCIN 500 MG PO TABS: 500.0000 mg | ORAL_TABLET | Freq: Every day | ORAL | 0 refills | Status: AC

## 2022-02-17 MED ORDER — MISC. DEVICES KIT: PACK | 0 refills | Status: AC

## 2022-02-17 MED ORDER — ALBUTEROL SULFATE (2.5 MG/3ML) 0.083% IN NEBU: 2.5000 mg | INHALATION_SOLUTION | Freq: Four times a day (QID) | RESPIRATORY_TRACT | 2 refills | Status: AC | PRN

## 2022-02-17 NOTE — ED Triage Notes (Signed)
Pt reports got flu from her mother. Symptoms been going on for 4 weeks. Coughing up yellow phlegm. Pt concerned that cough is still persistent.  Pt got prednisone and promethazine from ED. Mucinex Nyquil and still having low grade fever so taking Tylenol. Was given magic mouth wash as well.

## 2022-02-17 NOTE — Discharge Instructions (Addendum)
I am covering you for bacterial infection with antibiotic. Please take the azithromycin as prescribed. Take with food to avoid upset stomach.  Please take the prednisone as prescribed. This will be a 6 day taper, decreasing by 10 mg every day. Instructions are on the medication packet.  I recommend use inhaler every 6 hours over the next 2-3 days. You can also use nebulizer machine if this works well for you.  Cough may linger for a few more weeks but should start to improve over the week. If symptoms persist or worsen, I recommend going to the hospital for evaluation.

## 2022-02-17 NOTE — ED Provider Notes (Signed)
Amanda Cox    CSN: [id number] Arrival date & time: 02/17/22  1354      History   Chief Complaint Chief Complaint  Patient presents with   Cough    HPI Amanda Cox is a 46 y.o. female.  Presents with cough Reports 4-day history of worsening She had cough for about 4 weeks before that almost went away, then worsened   Seen 12/24 in the ED, diagnosed with flu Was given short course prednisone, promethazine Has also tried mucinex Reports no improvement using medications as prescribed   No fevers Reports sputum is yellow  Past Medical History:  Diagnosis Date   Chronic insomnia 11/08/2016   Dyspareunia    Essential hypertension, benign    Genital herpes, unspecified    Headache syndrome 11/08/2016   Irregular menstrual cycle    Lumbar spondylosis    Meralgia paraesthetica, right 09/10/2020   Neuralgia and neuritis, unspecified    Surveillance of previously prescribed contraceptive pill     Patient Active Problem List   Diagnosis Date Noted   Meralgia paraesthetica, right 09/10/2020   Routine general medical examination at a health care facility 08/02/2018   Constipation 04/11/2018   Chronic insomnia 11/08/2016   Snoring 08/19/2013   Obesity, unspecified 08/19/2013   Essential hypertension, benign 07/10/2013   Genital herpes 07/17/2012    Past Surgical History:  Procedure Laterality Date   BREAST REDUCTION SURGERY  2005   CESAREAN SECTION  2003    OB History     Gravida  2   Para  1   Term  1   Preterm      AB  1   Living  1      SAB      IAB  1   Ectopic      Multiple      Live Births  1            Home Medications    Prior to Admission medications   Medication Sig Start Date End Date Taking? Authorizing Provider  albuterol (PROVENTIL) (2.5 MG/3ML) 0.083% nebulizer solution Take 3 mLs (2.5 mg total) by nebulization every 6 (six) hours as needed. 02/17/22  Yes Buddie Marston, Wells Guiles, PA-C  albuterol (VENTOLIN  HFA) 108 (90 Base) MCG/ACT inhaler Inhale 2 puffs into the lungs every 6 (six) hours. 02/17/22  Yes Cynia Abruzzo, Wells Guiles, PA-C  azithromycin (ZITHROMAX) 500 MG tablet Take 1 tablet (500 mg total) by mouth daily for 3 doses. 02/17/22 02/20/22 Yes Kirstan Fentress, Wells Guiles, PA-C  [employer] KIT Nebulizer machine with tubing and mask. Reactive airway ICD cod J68.3 02/17/22  Yes Wolfe Camarena, Wells Guiles, PA-C  predniSONE (STERAPRED UNI-PAK 21 TAB) 10 MG (21) TBPK tablet Take by mouth daily. Take as prescribed on package 02/17/22  Yes Ashlyn Cabler, Wells Guiles, PA-C  fluticasone (FLONASE) 50 MCG/ACT nasal spray Place 2 sprays into both nostrils daily.    [provider]  ibuprofen (ADVIL) 800 MG tablet Take 1 tablet (800 mg total) by mouth every 8 (eight) hours as needed. 07/12/21   [name], MD  magic mouthwash (lidocaine, diphenhydrAMINE, alum & mag hydroxide) suspension Swish and swallow 5 mLs 3 (three) times daily as needed for mouth pain. 02/12/22   Tedd Sias, PA  Norgestimate-Ethinyl Estradiol Triphasic (TRI-LO-MILI) 0.18/0.215/0.25 MG-25 MCG tab Take 1 tablet by mouth daily. 07/19/21   [name], MD  olmesartan (BENICAR) 40 MG tablet Take 40 mg by mouth daily. 01/23/22   [provider]  ondansetron (ZOFRAN-ODT) 4  MG disintegrating tablet Take 1 tablet (4 mg total) by mouth every 8 (eight) hours as needed for nausea or vomiting. 02/12/22   Tedd Sias, PA  torsemide (DEMADEX) 20 MG tablet Take by mouth. 01/23/22   [provider]  valACYclovir (VALTREX) 500 MG tablet Take 1 tablet (500 mg total) by mouth 2 (two) times daily. Take for 3 days prn each occurrence. 06/23/21   [name], MD    Family History Family History  Problem Relation Age of Onset   Hypertension Mother    Diabetes Mother    Hyperlipidemia Mother    Arthritis Mother    Hypertension Father    Diabetes Father    Arthritis Sister    Diabetes Sister    Arthritis Maternal Grandmother    Arthritis  Paternal Grandmother    Hypertension Paternal Grandmother    Congenital heart disease Other    Colon cancer Neg Hx    Rectal cancer Neg Hx    Esophageal cancer Neg Hx    Stomach cancer Neg Hx    Liver cancer Neg Hx     Social History Social History   Tobacco Use   Smoking status: Never    Passive exposure: Never   Smokeless tobacco: Never  Vaping Use   Vaping Use: Never used  Substance Use Topics   Alcohol use: Yes    Alcohol/week: 0.0 standard drinks of alcohol    Comment: rare- once every 3 months   Drug use: No     Allergies   Sulfa antibiotics   Review of Systems Review of Systems  Respiratory:  Positive for cough.    As per HPI  Physical Exam Triage Vital Signs ED Triage Vitals  Enc Vitals Group     BP 02/17/22 1603 135/87     Pulse Rate 02/17/22 1603 88     Resp 02/17/22 1603 17     Temp 02/17/22 1603 98.7 F (37.1 C)     Temp Source 02/17/22 1603 Oral     SpO2 02/17/22 1603 98 %     Weight --      Height --      Head Circumference --      Peak Flow --      Pain Score 02/17/22 1558 8     Pain Loc --      Pain Edu? --      Excl. in [place]? --    No data found.  Updated Vital Signs BP 135/87 (BP Location: Right Arm)   Pulse 88   Temp 98.7 F (37.1 C) (Oral)   Resp 17   LMP 01/27/2022   SpO2 98%   Physical Exam Vitals and nursing note reviewed.  Constitutional:      General: She is not in acute distress.    Appearance: Normal appearance.  HENT:     Nose: No congestion or rhinorrhea.     Mouth/Throat:     Mouth: Mucous membranes are moist.     Pharynx: Oropharynx is clear. No posterior oropharyngeal erythema.  Eyes:     Conjunctiva/sclera: Conjunctivae normal.  Cardiovascular:     Rate and Rhythm: Normal rate and regular rhythm.     Pulses: Normal pulses.     Heart sounds: Normal heart sounds.  Pulmonary:     Effort: Pulmonary effort is normal.     Breath sounds: Rales present.     Comments: Low pitched rales on expiration bilat  lower lobes Musculoskeletal:     Cervical  back: Normal range of motion.  Lymphadenopathy:     Cervical: No cervical adenopathy.  Skin:    General: Skin is warm and dry.  Neurological:     Mental Status: She is alert and oriented to person, place, and time.    UC Treatments / Results  Labs (all labs ordered are listed, but only abnormal results are displayed) Labs Reviewed - No data to display  EKG  Radiology DG Chest 2 View  Result Date: 02/17/2022 CLINICAL DATA:  Cough, rales EXAM: CHEST - 2 VIEW COMPARISON:  12/08/2020 FINDINGS: The heart size and mediastinal contours are within normal limits. Both lungs are clear. The visualized skeletal structures are unremarkable. IMPRESSION: No active cardiopulmonary disease. Electronically Signed   By: Fidela Salisbury M.D.   On: 02/17/2022 17:14    Procedures Procedures (including critical care time)  Medications Ordered in UC Medications - No data to display  Initial Impression / Assessment and Plan / UC Course  I have reviewed the triage vital signs and the nursing notes.  Pertinent labs & imaging results that were available during my care of the patient were reviewed by me and considered in my medical decision making (see chart for details).  Chest xray negative. Consider viral cough given recent influenza diagnosis.. Although based on lung exam and possible 4 week duration, will cover for underlying bacterial infection. Azithromycin 500 mg once daily x 3 days I recommend a longer steroid taper - she was put on 30 mg daily for 5 days. Will try 6 day 21 tab taper in addition to using inhaler every 6 hours for the next 2-3 days Patient prefers nebulizer machine - gave paper prescription to bring to medical supply store.  Return precautions discussed. Patient agrees to plan  Final Clinical Impressions(s) / UC Diagnoses   Final diagnoses:  Persistent cough for 3 weeks or longer  Community acquired pneumonia, unspecified laterality      Discharge Instructions      I am covering you for bacterial infection with antibiotic. Please take the azithromycin as prescribed. Take with food to avoid upset stomach.  Please take the prednisone as prescribed. This will be a 6 day taper, decreasing by 10 mg every day. Instructions are on the medication packet.  I recommend use inhaler every 6 hours over the next 2-3 days. You can also use nebulizer machine if this works well for you.  Cough may linger for a few more weeks but should start to improve over the week. If symptoms persist or worsen, I recommend going to the hospital for evaluation.      ED Prescriptions     Medication Sig Dispense Auth. Provider   predniSONE (STERAPRED UNI-PAK 21 TAB) 10 MG (21) TBPK tablet Take by mouth daily. Take as prescribed on package 21 tablet Marisa Hage, PA-C   albuterol (VENTOLIN HFA) 108 (90 Base) MCG/ACT inhaler Inhale 2 puffs into the lungs every 6 (six) hours. 18 g Levelle Edelen, PA-C   azithromycin (ZITHROMAX) 500 MG tablet Take 1 tablet (500 mg total) by mouth daily for 3 doses. 3 tablet Ariahna Smiddy, PA-C   albuterol (PROVENTIL) (2.5 MG/3ML) 0.083% nebulizer solution Take 3 mLs (2.5 mg total) by nebulization every 6 (six) hours as needed. 75 mL Tatisha Cerino, Wells Guiles, PA-C   [employer] KIT Nebulizer machine with tubing and mask. Reactive airway ICD cod J68.3 1 kit Suprina [place], Wells Guiles, PA-C      PDMP not reviewed this encounter.   Les Pou, [place] 02/17/22 1845

## 2022-02-23 ENCOUNTER — Ambulatory Visit (HOSPITAL_COMMUNITY)
Admission: RE | Admit: 2022-02-23 | Discharge: 2022-02-23 | Disposition: A | Discharge status: Home / Self Care | Payer: No Typology Code available for payment source | Source: Ambulatory Visit | Attending: Internal Medicine | Admitting: Internal Medicine

## 2022-02-23 DIAGNOSIS — R0602 Shortness of breath: Secondary | ICD-10-CM | POA: Insufficient documentation

## 2022-02-23 DIAGNOSIS — I10 Essential (primary) hypertension: Secondary | ICD-10-CM | POA: Diagnosis not present

## 2022-02-23 DIAGNOSIS — R079 Chest pain, unspecified: Secondary | ICD-10-CM | POA: Insufficient documentation

## 2022-02-23 LAB — ECHOCARDIOGRAM COMPLETE
Area-P 1/2: 3.72 cm2
Calc EF: 58.4 %
S' Lateral: 2.9 cm
Single Plane A2C EF: 58.2 %
Single Plane A4C EF: 56.7 %

## 2022-02-23 NOTE — Progress Notes (Signed)
  Echocardiogram 2D Echocardiogram has been performed.  Amanda Cox 02/23/2022, 8:43 AM

## 2022-03-01 ENCOUNTER — Other Ambulatory Visit (HOSPITAL_COMMUNITY): Payer: No Typology Code available for payment source

## 2022-04-05 ENCOUNTER — Other Ambulatory Visit: Payer: Self-pay | Admitting: Internal Medicine

## 2022-04-05 DIAGNOSIS — Z1231 Encounter for screening mammogram for malignant neoplasm of breast: Secondary | ICD-10-CM

## 2022-06-14 ENCOUNTER — Ambulatory Visit: Payer: No Typology Code available for payment source | Admitting: Podiatry

## 2022-06-29 ENCOUNTER — Other Ambulatory Visit: Payer: Self-pay

## 2022-06-29 DIAGNOSIS — B3731 Acute candidiasis of vulva and vagina: Secondary | ICD-10-CM

## 2022-06-29 MED ORDER — FLUCONAZOLE 200 MG PO TABS: 200.0000 mg | ORAL_TABLET | ORAL | 2 refills | Status: DC

## 2022-07-03 ENCOUNTER — Encounter: Payer: Self-pay | Admitting: Podiatry

## 2022-07-03 ENCOUNTER — Ambulatory Visit (INDEPENDENT_AMBULATORY_CARE_PROVIDER_SITE_OTHER): Payer: No Typology Code available for payment source | Admitting: Podiatry

## 2022-07-03 DIAGNOSIS — M722 Plantar fascial fibromatosis: Secondary | ICD-10-CM

## 2022-07-03 DIAGNOSIS — L603 Nail dystrophy: Secondary | ICD-10-CM | POA: Diagnosis not present

## 2022-07-03 MED ORDER — TRIAMCINOLONE ACETONIDE 10 MG/ML IJ SUSP
10.0000 mg | Freq: Once | INTRAMUSCULAR | Status: AC
  Administered 2022-07-03: 10 mg

## 2022-07-03 NOTE — Progress Notes (Signed)
Subjective: Chief Complaint  Patient presents with   Plantar Fasciitis    Flare of fasciitis bilateral (R=L)   "The heels been bothering me again for a couple months. I thought it might be time for some shots"   47 year old female presents for above concerns.  Started about a month or so but not as bad as what it was previously.  No recent injury or changes.  Objective: AAO x3, NAD DP/PT pulses palpable bilaterally, CRT less than 3 seconds As are hypertrophic and dystrophic.  There is darkened discoloration of all the toenails there is no extensively hyperpigmentation in the surrounding skin.  No edema, erythema or signs of infection. There is recurrence of tenderness to palpation along the plantar medial tubercle of the calcaneus at the insertion of plantar fascia on the left and right foot. There is no pain along the course of the plantar fascia within the arch of the foot. Plantar fascia appears to be intact. There is no pain with lateral compression of the calcaneus or pain with vibratory sensation. There is no pain along the course or insertion of the achilles tendon. No other areas of tenderness to bilateral lower extremities.  Negative Tinel sign. No pain with calf compression, swelling, warmth, erythema  Assessment: Plantar fasciitis, onychodystrophy  Plan: -All treatment options discussed with the patient including all alternatives, risks, complications.  -Steroid injections bilaterally.  Continue stretching, icing a regular basis as well as shoes, good arch support.  See procedure note below. -Urea nail gel for the toenails.  They are all clinically appear to be about the same.  If symptoms persist for 1 worsens will biopsy. -Patient encouraged to call the office with any questions, concerns, change in symptoms.   Procedure: Injection Tendon/Ligament Discussed alternatives, risks, complications and verbal consent was obtained.  Location: Bilateral plantar fascia at the glabrous  junction; medial approach. Skin Prep: Alcohol. Injectate: 0.5cc 0.5% marcaine plain, 0.5 cc 2% lidocaine plain and, 1 cc kenalog 10. Disposition: Patient tolerated procedure well. Injection site dressed with a band-aid.  Post-injection care was discussed and return precautions discussed.   No follow-ups on file.  Vivi Barrack DPM

## 2022-07-03 NOTE — Patient Instructions (Addendum)
You can use UREA NAIL GEL on the toenails  ---  While at your visit today you received a steroid injection in your foot or ankle to help with your pain. Along with having the steroid medication there is some "numbing" medication in the shot that you received. Due to this you may notice some numbness to the area for the next couple of hours.   I would recommend limiting activity for the next few days to help the steroid injection take affect.    The actually benefit from the steroid injection may take up to 2-7 days to see a difference. You may actually experience a small (as in 10%) INCREASE in pain in the first 24 hours---that is common. It would be best if you can ice the area today and take anti-inflammatory medications (such as Ibuprofen, Motrin, or Aleve) if you are able to take these medications. If you were prescribed another medication to help with the pain go ahead and start that medication today    Things to watch out for that you should contact us or a health care provider urgently would include: 1. Unusual (as in more than 10%) increase in pain 2. New fever > 101.5 3. New swelling or redness of the injected area.  4. Streaking of red lines around the area injected.  If you have any questions or concerns about this, please give our office a call at [phone].    ----  Plantar Fasciitis (Heel Spur Syndrome) with Rehab The plantar fascia is a fibrous, ligament-like, soft-tissue structure that spans the bottom of the foot. Plantar fasciitis is a condition that causes pain in the foot due to inflammation of the tissue. SYMPTOMS  Pain and tenderness on the underneath side of the foot. Pain that worsens with standing or walking. CAUSES  Plantar fasciitis is caused by irritation and injury to the plantar fascia on the underneath side of the foot. Common mechanisms of injury include: Direct trauma to bottom of the foot. Damage to a small nerve that runs under the foot where the  main fascia attaches to the heel bone. Stress placed on the plantar fascia due to bone spurs. RISK INCREASES WITH:  Activities that place stress on the plantar fascia (running, jumping, pivoting, or cutting). Poor strength and flexibility. Improperly fitted shoes. Tight calf muscles. Flat feet. Failure to warm-up properly before activity. Obesity. PREVENTION Warm up and stretch properly before activity. Allow for adequate recovery between workouts. Maintain physical fitness: Strength, flexibility, and endurance. Cardiovascular fitness. Maintain a health body weight. Avoid stress on the plantar fascia. Wear properly fitted shoes, including arch supports for individuals who have flat feet.  PROGNOSIS  If treated properly, then the symptoms of plantar fasciitis usually resolve without surgery. However, occasionally surgery is necessary.  RELATED COMPLICATIONS  Recurrent symptoms that may result in a chronic condition. Problems of the lower back that are caused by compensating for the injury, such as limping. Pain or weakness of the foot during push-off following surgery. Chronic inflammation, scarring, and partial or complete fascia tear, occurring more often from repeated injections.  TREATMENT  Treatment initially involves the use of ice and medication to help reduce pain and inflammation. The use of strengthening and stretching exercises may help reduce pain with activity, especially stretches of the Achilles tendon. These exercises may be performed at home or with a therapist. Your caregiver may recommend that you use heel cups of arch supports to help reduce stress on the plantar fascia. Occasionally, corticosteroid injections  are given to reduce inflammation. If symptoms persist for greater than 6 months despite non-surgical (conservative), then surgery may be recommended.   MEDICATION  If pain medication is necessary, then nonsteroidal anti-inflammatory medications, such as  aspirin and ibuprofen, or other minor pain relievers, such as acetaminophen, are often recommended. Do not take pain medication within 7 days before surgery. Prescription pain relievers may be given if deemed necessary by your caregiver. Use only as directed and only as much as you need. Corticosteroid injections may be given by your caregiver. These injections should be reserved for the most serious cases, because they may only be given a certain number of times.  HEAT AND COLD Cold treatment (icing) relieves pain and reduces inflammation. Cold treatment should be applied for 10 to 15 minutes every 2 to 3 hours for inflammation and pain and immediately after any activity that aggravates your symptoms. Use ice packs or massage the area with a piece of ice (ice massage). Heat treatment may be used prior to performing the stretching and strengthening activities prescribed by your caregiver, physical therapist, or athletic trainer. Use a heat pack or soak the injury in warm water.  SEEK IMMEDIATE MEDICAL CARE IF: Treatment seems to offer no benefit, or the condition worsens. Any medications produce adverse side effects.  EXERCISES- RANGE OF MOTION (ROM) AND STRETCHING EXERCISES - Plantar Fasciitis (Heel Spur Syndrome) These exercises may help you when beginning to rehabilitate your injury. Your symptoms may resolve with or without further involvement from your physician, physical therapist or athletic trainer. While completing these exercises, remember:  Restoring tissue flexibility helps normal motion to return to the joints. This allows healthier, less painful movement and activity. An effective stretch should be held for at least 30 seconds. A stretch should never be painful. You should only feel a gentle lengthening or release in the stretched tissue.  RANGE OF MOTION - Toe Extension, Flexion Sit with your right / left leg crossed over your opposite knee. Grasp your toes and gently pull them  back toward the top of your foot. You should feel a stretch on the bottom of your toes and/or foot. Hold this stretch for 10 seconds. Now, gently pull your toes toward the bottom of your foot. You should feel a stretch on the top of your toes and or foot. Hold this stretch for 10 seconds. Repeat  times. Complete this stretch 3 times per day.   RANGE OF MOTION - Ankle Dorsiflexion, Active Assisted Remove shoes and sit on a chair that is preferably not on a carpeted surface. Place right / left foot under knee. Extend your opposite leg for support. Keeping your heel down, slide your right / left foot back toward the chair until you feel a stretch at your ankle or calf. If you do not feel a stretch, slide your bottom forward to the edge of the chair, while still keeping your heel down. Hold this stretch for 10 seconds. Repeat 3 times. Complete this stretch 2 times per day.   STRETCH  Gastroc, Standing Place hands on wall. Extend right / left leg, keeping the front knee somewhat bent. Slightly point your toes inward on your back foot. Keeping your right / left heel on the floor and your knee straight, shift your weight toward the wall, not allowing your back to arch. You should feel a gentle stretch in the right / left calf. Hold this position for 10 seconds. Repeat 3 times. Complete this stretch 2 times per day.  STRETCH  Soleus, Standing Place hands on wall. Extend right / left leg, keeping the other knee somewhat bent. Slightly point your toes inward on your back foot. Keep your right / left heel on the floor, bend your back knee, and slightly shift your weight over the back leg so that you feel a gentle stretch deep in your back calf. Hold this position for 10 seconds. Repeat 3 times. Complete this stretch 2 times per day.  STRETCH  Gastrocsoleus, Standing  Note: This exercise can place a lot of stress on your foot and ankle. Please complete this exercise only if specifically instructed  by your caregiver.  Place the ball of your right / left foot on a step, keeping your other foot firmly on the same step. Hold on to the wall or a rail for balance. Slowly lift your other foot, allowing your body weight to press your heel down over the edge of the step. You should feel a stretch in your right / left calf. Hold this position for 10 seconds. Repeat this exercise with a slight bend in your right / left knee. Repeat 3 times. Complete this stretch 2 times per day.   STRENGTHENING EXERCISES - Plantar Fasciitis (Heel Spur Syndrome)  These exercises may help you when beginning to rehabilitate your injury. They may resolve your symptoms with or without further involvement from your physician, physical therapist or athletic trainer. While completing these exercises, remember:  Muscles can gain both the endurance and the strength needed for everyday activities through controlled exercises. Complete these exercises as instructed by your physician, physical therapist or athletic trainer. Progress the resistance and repetitions only as guided.  STRENGTH - Towel Curls Sit in a chair positioned on a non-carpeted surface. Place your foot on a towel, keeping your heel on the floor. Pull the towel toward your heel by only curling your toes. Keep your heel on the floor. Repeat 3 times. Complete this exercise 2 times per day.  STRENGTH - Ankle Inversion Secure one end of a rubber exercise band/tubing to a fixed object (table, pole). Loop the other end around your foot just before your toes. Place your fists between your knees. This will focus your strengthening at your ankle. Slowly, pull your big toe up and in, making sure the band/tubing is positioned to resist the entire motion. Hold this position for 10 seconds. Have your muscles resist the band/tubing as it slowly pulls your foot back to the starting position. Repeat 3 times. Complete this exercises 2 times per day.  Document Released:  02/06/2005 Document Revised: 05/01/2011 Document Reviewed: 05/21/2008 [facility] Patient Information 2014 [place], [place].

## 2022-07-11 ENCOUNTER — Other Ambulatory Visit (HOSPITAL_COMMUNITY)
Admission: RE | Admit: 2022-07-11 | Discharge: 2022-07-11 | Disposition: A | Discharge status: Home / Self Care | Payer: No Typology Code available for payment source | Source: Ambulatory Visit | Attending: Obstetrics | Admitting: Obstetrics

## 2022-07-11 ENCOUNTER — Encounter: Payer: Self-pay | Admitting: Obstetrics

## 2022-07-11 ENCOUNTER — Ambulatory Visit: Payer: No Typology Code available for payment source | Admitting: Obstetrics

## 2022-07-11 VITALS — BP 139/98 | HR 73 | Ht 63.0 in | Wt 216.8 lb

## 2022-07-11 DIAGNOSIS — Z01419 Encounter for gynecological examination (general) (routine) without abnormal findings: Secondary | ICD-10-CM | POA: Insufficient documentation

## 2022-07-11 DIAGNOSIS — B379 Candidiasis, unspecified: Secondary | ICD-10-CM

## 2022-07-11 DIAGNOSIS — N760 Acute vaginitis: Secondary | ICD-10-CM

## 2022-07-11 DIAGNOSIS — R7303 Prediabetes: Secondary | ICD-10-CM

## 2022-07-11 DIAGNOSIS — A6000 Herpesviral infection of urogenital system, unspecified: Secondary | ICD-10-CM

## 2022-07-11 DIAGNOSIS — Z3041 Encounter for surveillance of contraceptive pills: Secondary | ICD-10-CM

## 2022-07-11 DIAGNOSIS — R8290 Unspecified abnormal findings in urine: Secondary | ICD-10-CM | POA: Diagnosis not present

## 2022-07-11 DIAGNOSIS — N946 Dysmenorrhea, unspecified: Secondary | ICD-10-CM

## 2022-07-11 DIAGNOSIS — B9689 Other specified bacterial agents as the cause of diseases classified elsewhere: Secondary | ICD-10-CM

## 2022-07-11 DIAGNOSIS — I10 Essential (primary) hypertension: Secondary | ICD-10-CM

## 2022-07-11 LAB — POCT URINALYSIS DIPSTICK
Bilirubin, UA: NEGATIVE
Blood, UA: NEGATIVE
Glucose, UA: NEGATIVE
Ketones, UA: NEGATIVE
Leukocytes, UA: NEGATIVE
Nitrite, UA: NEGATIVE
Protein, UA: NEGATIVE
Spec Grav, UA: 1.015 (ref 1.010–1.025)
Urobilinogen, UA: 0.2 E.U./dL
pH, UA: 6 (ref 5.0–8.0)

## 2022-07-11 MED ORDER — VALTREX 500 MG PO TABS
500.0000 mg | ORAL_TABLET | Freq: Every day | ORAL | 11 refills | Status: DC
Start: 2022-07-11 — End: 2023-03-09

## 2022-07-11 MED ORDER — METRONIDAZOLE 0.75 % VA GEL
1.0000 | Freq: Two times a day (BID) | VAGINAL | 0 refills | Status: DC
Start: 2022-07-11 — End: 2023-01-25

## 2022-07-11 MED ORDER — METFORMIN HCL ER 500 MG PO TB24
1000.0000 mg | ORAL_TABLET | Freq: Two times a day (BID) | ORAL | Status: DC
Start: 2022-07-11 — End: 2022-07-12

## 2022-07-11 MED ORDER — IBUPROFEN 800 MG PO TABS: 800.0000 mg | ORAL_TABLET | Freq: Three times a day (TID) | ORAL | 5 refills | Status: DC | PRN

## 2022-07-11 MED ORDER — METFORMIN HCL ER 500 MG PO TB24
500.0000 mg | ORAL_TABLET | Freq: Two times a day (BID) | ORAL | Status: DC
Start: 2022-07-11 — End: 2022-07-11

## 2022-07-11 MED ORDER — FLUCONAZOLE 200 MG PO TABS
200.0000 mg | ORAL_TABLET | ORAL | 2 refills | Status: DC
Start: 2022-07-11 — End: 2023-01-25

## 2022-07-11 MED ORDER — NORGESTIM-ETH ESTRAD TRIPHASIC 0.18/0.215/0.25 MG-25 MCG PO TABS: 1.0000 | ORAL_TABLET | Freq: Every day | ORAL | 11 refills | Status: DC

## 2022-07-11 NOTE — Progress Notes (Signed)
Patient presents for AEX. Last Pap: 07/12/2021- normal Last Mammogram: 11/21/2021- normal, next scheduled 11/23/2022 Contraception: Using OCPs, satisfied with method Vaginal/Urinary Symptoms: odor with urination STD Screen: Desires vaginal swab with blood work Other Concerns: None

## 2022-07-11 NOTE — Progress Notes (Signed)
Subjective:  Amanda Cox is a 47 y.o. G2P1011, being seen today for routine annual gyn exam and pap smear.  She complains of vaginal discharge and malodorous urine.   The following portions of the patient's history were reviewed and updated as appropriate: allergies, current medications, past family history, past medical history, past social history, past surgical history and problem list. Problem list updated.  Objective:   Vitals:   07/11/22 0826  BP: (!) 139/98  Pulse: 73  Weight: 216 lb 12.8 oz (98.3 kg)  Height: 5\' 3"  (1.6 m)    Fetal Status:           General:  Alert, oriented and cooperative. Patient is in no acute distress.  Skin: Skin is warm and dry. No rash noted.   Cardiovascular: Normal heart rate noted  Respiratory: Normal respiratory effort, no problems with respiration noted  Abdomen: Soft, NT  Pelvic:  Normal pelvic exam  Extremities: Normal range of motion.     Mental Status: Normal mood and affect. Normal behavior. Normal judgment and thought content.   Urinalysis:      Assessment and Plan:    1. Encounter for gynecological examination with Papanicolaou smear of cervix Rx: - RPR - HIV Antibody (routine testing w rflx) - Hepatitis B Surface AntiGEN - Hepatitis C Antibody - Cervicovaginal ancillary only( [place]) - Cytology - PAP( [place])  2. Dysmenorrhea Rx: - ibuprofen (ADVIL) 800 MG tablet; Take 1 tablet (800 mg total) by mouth every 8 (eight) hours as needed.  Dispense: 30 tablet; Refill: 5  3. Encounter for surveillance of contraceptive pills Rx: - Norgestimate-Ethinyl Estradiol Triphasic (TRI-LO-MILI) 0.18/0.215/0.25 MG-25 MCG tab; Take 1 tablet by mouth daily.  Dispense: 28 tablet; Refill: 11  4. Genital herpes simplex, unspecified site Rx: - VALTREX 500 MG tablet; Take 1 tablet (500 mg total) by mouth daily.  Dispense: 30 tablet; Refill: 11  5. BV (bacterial vaginosis) Rx: - metroNIDAZOLE (METROGEL) 0.75 % vaginal  gel; Place 1 Applicatorful vaginally 2 (two) times daily.  Dispense: 70 g; Refill: 0  6. Candida albicans infection Rx: - fluconazole (DIFLUCAN) 200 MG tablet; Take 1 tablet (200 mg total) by mouth every 3 (three) days.  Dispense: 3 tablet; Refill: 2  7. Abnormal urine odor Rx: - POCT urinalysis dipstick  8. Prediabetes Rx: - metFORMIN (GLUCOPHAGE-XR) 500 MG 24 hr tablet; Take 2 tablets (1,000 mg total) by mouth 2 (two) times daily with a meal.  Dispense: 500 tablet; Refill: 120  9. HTN (hypertension), benign Rx:    I have spent a total of 20 minutes of face-to-face time, excluding clinical staff time, reviewing notes and preparing to see patient, ordering tests and/or medications, and counseling the patient. .   Return in about 1 year (around 07/11/2023) for annual exam.   Brock Bad, MD 07/11/22

## 2022-07-12 ENCOUNTER — Other Ambulatory Visit: Payer: Self-pay

## 2022-07-12 DIAGNOSIS — R7303 Prediabetes: Secondary | ICD-10-CM

## 2022-07-12 LAB — CERVICOVAGINAL ANCILLARY ONLY
Bacterial Vaginitis (gardnerella): NEGATIVE
Candida Glabrata: NEGATIVE
Candida Vaginitis: NEGATIVE
Chlamydia: NEGATIVE
Comment: NEGATIVE
Comment: NEGATIVE
Comment: NEGATIVE
Comment: NEGATIVE
Comment: NEGATIVE
Comment: NORMAL
Neisseria Gonorrhea: NEGATIVE
Trichomonas: NEGATIVE

## 2022-07-12 LAB — HEPATITIS C ANTIBODY: Hep C Virus Ab: NONREACTIVE

## 2022-07-12 LAB — HIV ANTIBODY (ROUTINE TESTING W REFLEX): HIV Screen 4th Generation wRfx: NONREACTIVE

## 2022-07-12 LAB — HEPATITIS B SURFACE ANTIGEN: Hepatitis B Surface Ag: NEGATIVE

## 2022-07-12 LAB — RPR: RPR Ser Ql: NONREACTIVE

## 2022-07-12 MED ORDER — METFORMIN HCL ER 500 MG PO TB24
1000.0000 mg | ORAL_TABLET | Freq: Two times a day (BID) | ORAL | Status: DC
Start: 2022-07-12 — End: 2022-07-12

## 2022-07-12 MED ORDER — METFORMIN HCL ER 500 MG PO TB24
1000.0000 mg | ORAL_TABLET | Freq: Two times a day (BID) | ORAL | Status: DC
Start: 2022-07-12 — End: 2023-01-25

## 2022-07-13 LAB — CYTOLOGY - PAP: Diagnosis: NEGATIVE

## 2022-07-14 ENCOUNTER — Ambulatory Visit: Payer: No Typology Code available for payment source | Admitting: Obstetrics

## 2022-07-25 ENCOUNTER — Encounter: Payer: Self-pay | Admitting: Internal Medicine

## 2022-08-07 ENCOUNTER — Other Ambulatory Visit: Payer: Self-pay

## 2022-08-07 DIAGNOSIS — N760 Acute vaginitis: Secondary | ICD-10-CM

## 2022-08-07 MED ORDER — METRONIDAZOLE 500 MG PO TABS: 500.0000 mg | ORAL_TABLET | Freq: Two times a day (BID) | ORAL | 0 refills | Status: DC

## 2022-08-22 ENCOUNTER — Ambulatory Visit (AMBULATORY_SURGERY_CENTER): Payer: No Typology Code available for payment source

## 2022-08-22 ENCOUNTER — Other Ambulatory Visit: Payer: Self-pay

## 2022-08-22 VITALS — Ht 63.0 in | Wt 223.0 lb

## 2022-08-22 DIAGNOSIS — Z1211 Encounter for screening for malignant neoplasm of colon: Secondary | ICD-10-CM

## 2022-08-22 MED ORDER — NA SULFATE-K SULFATE-MG SULF 17.5-3.13-1.6 GM/177ML PO SOLN
1.0000 | Freq: Once | ORAL | 0 refills | Status: AC
Start: 2022-08-22 — End: 2022-08-22

## 2022-08-22 NOTE — Progress Notes (Signed)
Denies allergies to eggs or soy products. Denies complication of anesthesia or sedation. Denies use of weight loss medication. Denies use of O2.   Emmi instructions given for colonoscopy.  

## 2022-08-24 ENCOUNTER — Ambulatory Visit (HOSPITAL_COMMUNITY)
Admission: EM | Admit: 2022-08-24 | Discharge: 2022-08-24 | Disposition: A | Discharge status: Home / Self Care | Payer: No Typology Code available for payment source | Attending: Emergency Medicine | Admitting: Emergency Medicine

## 2022-08-24 ENCOUNTER — Encounter (HOSPITAL_COMMUNITY): Payer: Self-pay | Admitting: Emergency Medicine

## 2022-08-24 ENCOUNTER — Other Ambulatory Visit: Payer: Self-pay

## 2022-08-24 DIAGNOSIS — B349 Viral infection, unspecified: Secondary | ICD-10-CM

## 2022-08-24 DIAGNOSIS — Z20822 Contact with and (suspected) exposure to covid-19: Secondary | ICD-10-CM | POA: Insufficient documentation

## 2022-08-24 LAB — POCT INFLUENZA A/B
Influenza A, POC: NEGATIVE
Influenza B, POC: NEGATIVE

## 2022-08-24 MED ORDER — PROMETHAZINE-DM 6.25-15 MG/5ML PO SYRP: 5.0000 mL | ORAL_SOLUTION | Freq: Four times a day (QID) | ORAL | 0 refills | Status: DC | PRN

## 2022-08-24 MED ORDER — GUAIFENESIN 400 MG PO TABS: ORAL_TABLET | ORAL | 0 refills | Status: DC

## 2022-08-24 MED ORDER — LIDOCAINE VISCOUS HCL 2 % MT SOLN: 15.0000 mL | OROMUCOSAL | 0 refills | Status: DC | PRN

## 2022-08-24 NOTE — Discharge Instructions (Signed)
Your influenza test today was negative.  The results of your COVID-19 test should be available in the next 6 to 12 hours.  Initially able be posted to your MyChart account.  If it is positive, you will receive a phone call with further instructions.  In the meantime, I recommend several medications to alleviate your symptoms:  Flonase (fluticasone): This is a steroid nasal spray that used once daily, 1 spray in each nare.  This works best when used on a daily basis. This medication does not work well if it is only used when you think you need it.  After 3 to 5 days of use, you will notice significant reduction of the inflammation and mucus production that is currently being caused by exposure to allergens, whether seasonal or environmental.  The most common side effect of this medication is nosebleeds.  If you experience a nosebleed, please discontinue use for 1 week, then feel free to resume.  If you find that your insurance will not pay for this medication, please consider a different nasal steroids such as Nasonex (mometasone), or Nasacort (triamcinolone).   Advil, Motrin (ibuprofen): This is a good anti-inflammatory medication which addresses aches, pains and inflammation of the upper airways that causes sinus and nasal congestion as well as in the lower airways which makes your cough feel tight and sometimes burn.  I recommend that you take between 400 to 600 mg every 6-8 hours as needed.  Please do not take more than 2400 mg of ibuprofen in a 24-hour period and please do not take high doses of ibuprofen for more than 3 days in a row as this can lead to stomach ulcers.   Tylenol (acetaminophen): This is a good fever reducer.  If your body temperature rises above 101.5 as measured with a thermometer, it is recommended that you take 1,000 mg every 8 hours until your temperature falls below 101.5.  Please be careful when taking Tylenol (acetaminophen) along with other medications containing acetaminophen  or Tylenol.  The maximum dose of Tylenol in a 24-hour period is 3000 mg, taking more than 3000 mg per day can damage your liver.         Robitussin, Mucinex (guaifenesin): This is an expectorant.  This single symptom reliever helps break up chest congestion and loosen up thick nasal drainage making phlegm and drainage easier to cough up and to blow out from your nose.  I recommend taking 400 mg in either liquid or tablet form three times daily as needed.  I do not recommend the 12-hour extended relief version or doses higher than 400 mg per each dose as these often make some patients feel jittery or jumpy and can interfere with sleep.  I also do not recommend that you purchase guaifenesin with the ingredient " DM" which is dextromethorphan, a cough suppressant and that you plan on taking it at bedtime.  Guaifenesin 400 mg is a safe dose for people who are being treated for high blood pressure.  This medication is available over-the-counter.   Promethazine DM: Promethazine is both a nasal decongestant that dries up mucous membranes and an antinausea medication.  Promethazine often makes most patients feel fairly sleepy.  "DM" is dextromethorphan, a single symptom reliever which is a cough suppressant found in many over-the-counter cough medications and combination cold preparations.  Please take 5 mL before bedtime to minimize your cough which will help you sleep better.  I have sent a prescription for this medication to your pharmacy because  it cannot be purchased over-the-counter.   Xylocaine (lidocaine): This is a numbing medication that can be swished for 15 seconds and swallowed.  You can use this every 3 hours while awake to relieve pain in your mouth and throat.  I have sent a prescription for this medication to your pharmacy.   If symptoms have not meaningfully improved in the next 5 to 7 days, please return for repeat evaluation or follow-up with your regular provider.  If symptoms have worsened in  the next 3 to 5 days, please return for repeat evaluation or follow-up with your regular provider.    Thank you for visiting urgent care today.  We appreciate the opportunity to participate in your care.

## 2022-08-24 NOTE — ED Triage Notes (Signed)
Pt states she is been expose to a pt with Covid 3 days ago and now she is having sore throat and running nose.

## 2022-08-24 NOTE — ED Provider Notes (Addendum)
[facility]    CSN: [id number] Arrival date & time: 08/24/22  0803    HISTORY   Chief Complaint  Patient presents with   Sore Throat    Pt states she is been expose to a pt with Covid 3 days ago and now she is having sore throat and running nose.   HPI Amanda Cox is a pleasant, 47 y.o. female who presents to urgent care today. Pt states she was exposed to a pt with COVID-19 3 days ago and now she is having burning sensation in her nose, chills, body ache, fatigue, sore throat accompanied by pain with swallowing, cough intermittently productive of small amounts of yellow sputum and clear rhinorrhea.  Patient is requesting a prescription for Augmentin.  The history is provided by the patient.   Past Medical History:  Diagnosis Date   Chronic insomnia 11/08/2016   Dyspareunia    Essential hypertension, benign    Genital herpes, unspecified    GERD (gastroesophageal reflux disease)    Headache syndrome 11/08/2016   Irregular menstrual cycle    Lumbar spondylosis    Meralgia paraesthetica, right 09/10/2020   Neuralgia and neuritis, unspecified    Surveillance of previously prescribed contraceptive pill    Patient Active Problem List   Diagnosis Date Noted   Meralgia paraesthetica, right 09/10/2020   Routine general medical examination at a health care facility 08/02/2018   Constipation 04/11/2018   Chronic insomnia 11/08/2016   Snoring 08/19/2013   Obesity, unspecified 08/19/2013   Essential hypertension, benign 07/10/2013   Genital herpes 07/17/2012   Past Surgical History:  Procedure Laterality Date   BREAST REDUCTION SURGERY  2005   CESAREAN SECTION  2003   OB History     Gravida  2   Para  1   Term  1   Preterm      AB  1   Living  1      SAB      IAB  1   Ectopic      Multiple      Live Births  1          Home Medications    Prior to Admission medications   Medication Sig Start Date End Date Taking? Authorizing  Provider  albuterol (PROVENTIL) (2.5 MG/3ML) 0.083% nebulizer solution Take 3 mLs (2.5 mg total) by nebulization every 6 (six) hours as needed. 02/17/22   Rising, Lurena Joiner, PA-C  albuterol (VENTOLIN HFA) 108 (90 Base) MCG/ACT inhaler Inhale 2 puffs into the lungs every 6 (six) hours. 02/17/22   Rising, Lurena Joiner, PA-C  fluconazole (DIFLUCAN) 200 MG tablet Take 1 tablet (200 mg total) by mouth every 3 (three) days. 07/11/22   Brock Bad, MD  fluticasone (FLONASE) 50 MCG/ACT nasal spray Place 2 sprays into both nostrils daily.    [provider]  ibuprofen (ADVIL) 800 MG tablet Take 1 tablet (800 mg total) by mouth every 8 (eight) hours as needed. 07/11/22   Brock Bad, MD  metFORMIN (GLUCOPHAGE-XR) 500 MG 24 hr tablet Take 2 tablets (1,000 mg total) by mouth 2 (two) times daily with a meal. Patient not taking: Reported on 08/22/2022 07/12/22   Brock Bad, MD  metroNIDAZOLE (FLAGYL) 500 MG tablet Take 1 tablet (500 mg total) by mouth 2 (two) times daily. 08/07/22   Brock Bad, MD  metroNIDAZOLE (METROGEL) 0.75 % vaginal gel Place 1 Applicatorful vaginally 2 (two) times daily. 07/11/22   Brock Bad, MD  Misc. Devices KIT Nebulizer machine with tubing and mask. Reactive airway ICD cod J68.3 02/17/22   Rising, Lurena Joiner, PA-C  Multiple Vitamin (MULTIVITAMIN) tablet Take 1 tablet by mouth daily.    [provider]  Norgestimate-Ethinyl Estradiol Triphasic (TRI-LO-MILI) 0.18/0.215/0.25 MG-25 MCG tab Take 1 tablet by mouth daily. 07/11/22   Brock Bad, MD  olmesartan (BENICAR) 40 MG tablet Take 40 mg by mouth daily. 01/23/22   [provider]  OVER THE COUNTER MEDICATION Vitamin k 2 plus D3. One tablet daily.    [provider]  torsemide (DEMADEX) 20 MG tablet Take by mouth. 01/23/22   [provider]  VALTREX 500 MG tablet Take 1 tablet (500 mg total) by mouth daily. 07/11/22   Brock Bad, MD    Family History Family History   Problem Relation Age of Onset   Hypertension Mother    Diabetes Mother    Hyperlipidemia Mother    Arthritis Mother    Hypertension Father    Diabetes Father    Arthritis Sister    Diabetes Sister    Arthritis Maternal Grandmother    Arthritis Paternal Grandmother    Hypertension Paternal Grandmother    Congenital heart disease Other    Colon cancer Neg Hx    Rectal cancer Neg Hx    Esophageal cancer Neg Hx    Stomach cancer Neg Hx    Liver cancer Neg Hx    Social History Social History   Tobacco Use   Smoking status: Never    Passive exposure: Never   Smokeless tobacco: Never  Vaping Use   Vaping Use: Never used  Substance Use Topics   Alcohol use: Yes    Alcohol/week: 0.0 standard drinks of alcohol    Comment: rare- once every 3 months   Drug use: No   Allergies   Sulfa antibiotics  Review of Systems Review of Systems Pertinent findings revealed after performing a 14 point review of systems has been noted in the history of present illness.  Physical Exam Vital Signs BP 133/80 (BP Location: Right Arm)   Pulse 98   Temp 98.3 F (36.8 C) (Oral)   Resp 16   LMP 08/10/2022   SpO2 98%   No data found.  Physical Exam Vitals and nursing note reviewed.  Constitutional:      General: She is awake. She is not in acute distress.    Appearance: Normal appearance. She is well-developed and well-groomed. She is not ill-appearing.  HENT:     Head: Normocephalic and atraumatic.     Salivary Glands: Right salivary gland is not diffusely enlarged or tender. Left salivary gland is not diffusely enlarged or tender.     Right Ear: Tympanic membrane, ear canal and external ear normal. No drainage. No middle ear effusion. There is no impacted cerumen. Tympanic membrane is not erythematous or bulging.     Left Ear: Tympanic membrane, ear canal and external ear normal. No drainage.  No middle ear effusion. There is no impacted cerumen. Tympanic membrane is not erythematous or  bulging.     Nose: Nose normal. No nasal deformity, septal deviation, mucosal edema, congestion or rhinorrhea.     Right Turbinates: Not enlarged, swollen or pale.     Left Turbinates: Not enlarged, swollen or pale.     Right Sinus: No maxillary sinus tenderness or frontal sinus tenderness.     Left Sinus: No maxillary sinus tenderness or frontal sinus tenderness.     Mouth/Throat:  Lips: Pink. No lesions.     Mouth: Mucous membranes are moist. No oral lesions.     Pharynx: Oropharynx is clear. Uvula midline. No posterior oropharyngeal erythema or uvula swelling.     Tonsils: No tonsillar exudate. 0 on the right. 0 on the left.  Eyes:     General: Lids are normal.        Right eye: No discharge.        Left eye: No discharge.     Extraocular Movements: Extraocular movements intact.     Conjunctiva/sclera: Conjunctivae normal.     Right eye: Right conjunctiva is not injected.     Left eye: Left conjunctiva is not injected.  Neck:     Trachea: Trachea and phonation normal.  Cardiovascular:     Rate and Rhythm: Normal rate and regular rhythm.     Pulses: Normal pulses.     Heart sounds: Normal heart sounds. No murmur heard.    No friction rub. No gallop.  Pulmonary:     Effort: Pulmonary effort is normal. No accessory muscle usage, prolonged expiration or respiratory distress.     Breath sounds: Normal breath sounds. No stridor, decreased air movement or transmitted upper airway sounds. No decreased breath sounds, wheezing, rhonchi or rales.  Chest:     Chest wall: No tenderness.  Musculoskeletal:        General: Normal range of motion.     Cervical back: Normal range of motion and neck supple. Normal range of motion.  Lymphadenopathy:     Cervical: No cervical adenopathy.  Skin:    General: Skin is warm and dry.     Findings: No erythema or rash.  Neurological:     General: No focal deficit present.     Mental Status: She is alert and oriented to person, place, and time.   Psychiatric:        Mood and Affect: Mood normal.        Behavior: Behavior normal. Behavior is cooperative.     Visual Acuity Right Eye Distance:   Left Eye Distance:   Bilateral Distance:    Right Eye Near:   Left Eye Near:    Bilateral Near:     UC Couse / Diagnostics / Procedures:     Radiology No results found.  Procedures Procedures (including critical care time) EKG  Pending results:  Labs Reviewed  SARS CORONAVIRUS 2 (TAT 6-24 HRS)  POCT INFLUENZA A/B    Medications Ordered in UC: Medications - No data to display  UC Diagnoses / Final Clinical Impressions(s)   I have reviewed the triage vital signs and the nursing notes.  Pertinent labs & imaging results that were available during my care of the patient were reviewed by me and considered in my medical decision making (see chart for details).    Final diagnoses:  Exposure to COVID-19 virus  Viral illness   Patient advised physical exam findings are concerning for viral infection.  Influenza test was negative.  Patient advised to be notified of the COVID test once received.  Patient advised that antibiotics are not used to treat COVID-19.  Supportive medications prescribed.  Conservative care recommended.  Return precautions advised.  Please see discharge instructions below for details of plan of care as provided to patient. ED Prescriptions     Medication Sig Dispense Auth. Provider   guaifenesin (HUMIBID E) 400 MG TABS tablet Take 1 tablet 3 times daily as needed for chest congestion and cough 30 tablet  Theadora Rama Scales, PA-C   promethazine-dextromethorphan (PROMETHAZINE-DM) 6.25-15 MG/5ML syrup Take 5 mLs by mouth 4 (four) times daily as needed for cough. 118 mL Theadora Rama Scales, PA-C   lidocaine (XYLOCAINE) 2 % solution Use as directed 15 mLs in the mouth or throat every 3 (three) hours as needed for mouth pain (Sore throat). 300 mL Theadora Rama Scales, PA-C      PDMP not reviewed this  encounter.  Pending results:  Labs Reviewed  SARS CORONAVIRUS 2 (TAT 6-24 HRS)  POCT INFLUENZA A/B    Discharge Instructions:   Discharge Instructions      Your influenza test today was negative.  The results of your COVID-19 test should be available in the next 6 to 12 hours.  Initially able be posted to your MyChart account.  If it is positive, you will receive a phone call with further instructions.  In the meantime, I recommend several medications to alleviate your symptoms:  Flonase (fluticasone): This is a steroid nasal spray that used once daily, 1 spray in each nare.  This works best when used on a daily basis. This medication does not work well if it is only used when you think you need it.  After 3 to 5 days of use, you will notice significant reduction of the inflammation and mucus production that is currently being caused by exposure to allergens, whether seasonal or environmental.  The most common side effect of this medication is nosebleeds.  If you experience a nosebleed, please discontinue use for 1 week, then feel free to resume.  If you find that your insurance will not pay for this medication, please consider a different nasal steroids such as Nasonex (mometasone), or Nasacort (triamcinolone).   Advil, Motrin (ibuprofen): This is a good anti-inflammatory medication which addresses aches, pains and inflammation of the upper airways that causes sinus and nasal congestion as well as in the lower airways which makes your cough feel tight and sometimes burn.  I recommend that you take between 400 to 600 mg every 6-8 hours as needed.  Please do not take more than 2400 mg of ibuprofen in a 24-hour period and please do not take high doses of ibuprofen for more than 3 days in a row as this can lead to stomach ulcers.   Tylenol (acetaminophen): This is a good fever reducer.  If your body temperature rises above 101.5 as measured with a thermometer, it is recommended that you take  1,000 mg every 8 hours until your temperature falls below 101.5.  Please be careful when taking Tylenol (acetaminophen) along with other medications containing acetaminophen or Tylenol.  The maximum dose of Tylenol in a 24-hour period is 3000 mg, taking more than 3000 mg per day can damage your liver.         Robitussin, Mucinex (guaifenesin): This is an expectorant.  This single symptom reliever helps break up chest congestion and loosen up thick nasal drainage making phlegm and drainage easier to cough up and to blow out from your nose.  I recommend taking 400 mg in either liquid or tablet form three times daily as needed.  I do not recommend the 12-hour extended relief version or doses higher than 400 mg per each dose as these often make some patients feel jittery or jumpy and can interfere with sleep.  I also do not recommend that you purchase guaifenesin with the ingredient " DM" which is dextromethorphan, a cough suppressant and that you plan on taking it at  bedtime.  Guaifenesin 400 mg is a safe dose for people who are being treated for high blood pressure.  This medication is available over-the-counter.   Promethazine DM: Promethazine is both a nasal decongestant that dries up mucous membranes and an antinausea medication.  Promethazine often makes most patients feel fairly sleepy.  "DM" is dextromethorphan, a single symptom reliever which is a cough suppressant found in many over-the-counter cough medications and combination cold preparations.  Please take 5 mL before bedtime to minimize your cough which will help you sleep better.  I have sent a prescription for this medication to your pharmacy because it cannot be purchased over-the-counter.   Xylocaine (lidocaine): This is a numbing medication that can be swished for 15 seconds and swallowed.  You can use this every 3 hours while awake to relieve pain in your mouth and throat.  I have sent a prescription for this medication to your pharmacy.   If  symptoms have not meaningfully improved in the next 5 to 7 days, please return for repeat evaluation or follow-up with your regular provider.  If symptoms have worsened in the next 3 to 5 days, please return for repeat evaluation or follow-up with your regular provider.    Thank you for visiting urgent care today.  We appreciate the opportunity to participate in your care.       Disposition Upon Discharge:  Condition: stable for discharge home  Patient presented with an acute illness with associated systemic symptoms and significant discomfort requiring urgent management. In my opinion, this is a condition that a prudent lay person (someone who possesses an average knowledge of health and medicine) may potentially expect to result in complications if not addressed urgently such as respiratory distress, impairment of bodily function or dysfunction of bodily organs.   Routine symptom specific, illness specific and/or disease specific instructions were discussed with the patient and/or caregiver at length.   As such, the patient has been evaluated and assessed, work-up was performed and treatment was provided in alignment with urgent care protocols and evidence based medicine.  Patient/parent/caregiver has been advised that the patient may require follow up for further testing and treatment if the symptoms continue in spite of treatment, as clinically indicated and appropriate.  Patient/parent/caregiver has been advised to return to the [facility] or PCP if no better; to PCP or the Emergency Department if new signs and symptoms develop, or if the current signs or symptoms continue to change or worsen for further workup, evaluation and treatment as clinically indicated and appropriate  The patient will follow up with their current PCP if and as advised. If the patient does not currently have a PCP we will assist them in obtaining one.   The patient may need specialty follow up if the symptoms continue, in  spite of conservative treatment and management, for further workup, evaluation, consultation and treatment as clinically indicated and appropriate.  Patient/parent/caregiver verbalized understanding and agreement of plan as discussed.  All questions were addressed during visit.  Please see discharge instructions below for further details of plan.  This office note has been dictated using Teaching laboratory technician.  Unfortunately, this method of dictation can sometimes lead to typographical or grammatical errors.  I apologize for your inconvenience in advance if this occurs.  Please do not hesitate to reach out to me if clarification is needed.      Theadora Rama Scales, PA-C 08/24/22 0944    Theadora Rama Scales, PA-C 08/24/22 [phone]

## 2022-08-25 ENCOUNTER — Telehealth (HOSPITAL_COMMUNITY): Payer: Self-pay | Admitting: Emergency Medicine

## 2022-08-25 LAB — SARS CORONAVIRUS 2 (TAT 6-24 HRS): SARS Coronavirus 2: POSITIVE — AB

## 2022-08-25 MED ORDER — NIRMATRELVIR/RITONAVIR (PAXLOVID)TABLET: 3.0000 | ORAL_TABLET | Freq: Two times a day (BID) | ORAL | 0 refills | Status: AC

## 2022-08-28 ENCOUNTER — Other Ambulatory Visit: Payer: Self-pay

## 2022-08-28 ENCOUNTER — Telehealth: Payer: Self-pay | Admitting: Internal Medicine

## 2022-08-28 NOTE — Telephone Encounter (Signed)
Call to pt, states she is feeling ok today, let her know as long as she is not having a fever or severe cough within a few days of the upcoming procedure, she should be fine to have the procedure, pt to call if any concerns at that time.

## 2022-08-28 NOTE — Telephone Encounter (Signed)
Patient called in just to inform us that she test positive for Covid on 7/5 and she have a procedure schedule for 7/31. She want to make sure she dont need to cancel .

## 2022-09-06 ENCOUNTER — Telehealth: Payer: Self-pay | Admitting: Internal Medicine

## 2022-09-06 NOTE — Telephone Encounter (Signed)
Patient called in requesting a call back , she just received her kit for up coming procedure and she stated she still have some questing to address . Please advise

## 2022-09-06 NOTE — Telephone Encounter (Signed)
Spoke with pt regarding questions she had about jewelry. She stated she has two earrings in locations on her ears that can not be removed. RN instructed her to let the pre-op RN's know they can't be removed to difficulty of location and they will tape them. She was also instructed to remove her nose ring and to keep all other jewelry except her wedding band (only) at home (pt did not want to remove band. Pt stated that was the only questions she had at this time. RN instructed pt to call same # if she has any questions come up prior to procedure. Pt states she will.

## 2022-09-11 ENCOUNTER — Telehealth: Payer: Self-pay | Admitting: Internal Medicine

## 2022-09-11 IMAGING — DX DG CHEST 2V
2 series · 2 of 2 positions shown · non-contrast
Comparison: Prior chest radiographs 02/28/2018 and earlier.

CLINICAL DATA: Chest pain. Additional history provided: Patient
reports fever, generalized body aches, productive cough, symptoms
beginning on [REDACTED].

EXAM:
CHEST - 2 VIEW

[chest pa]
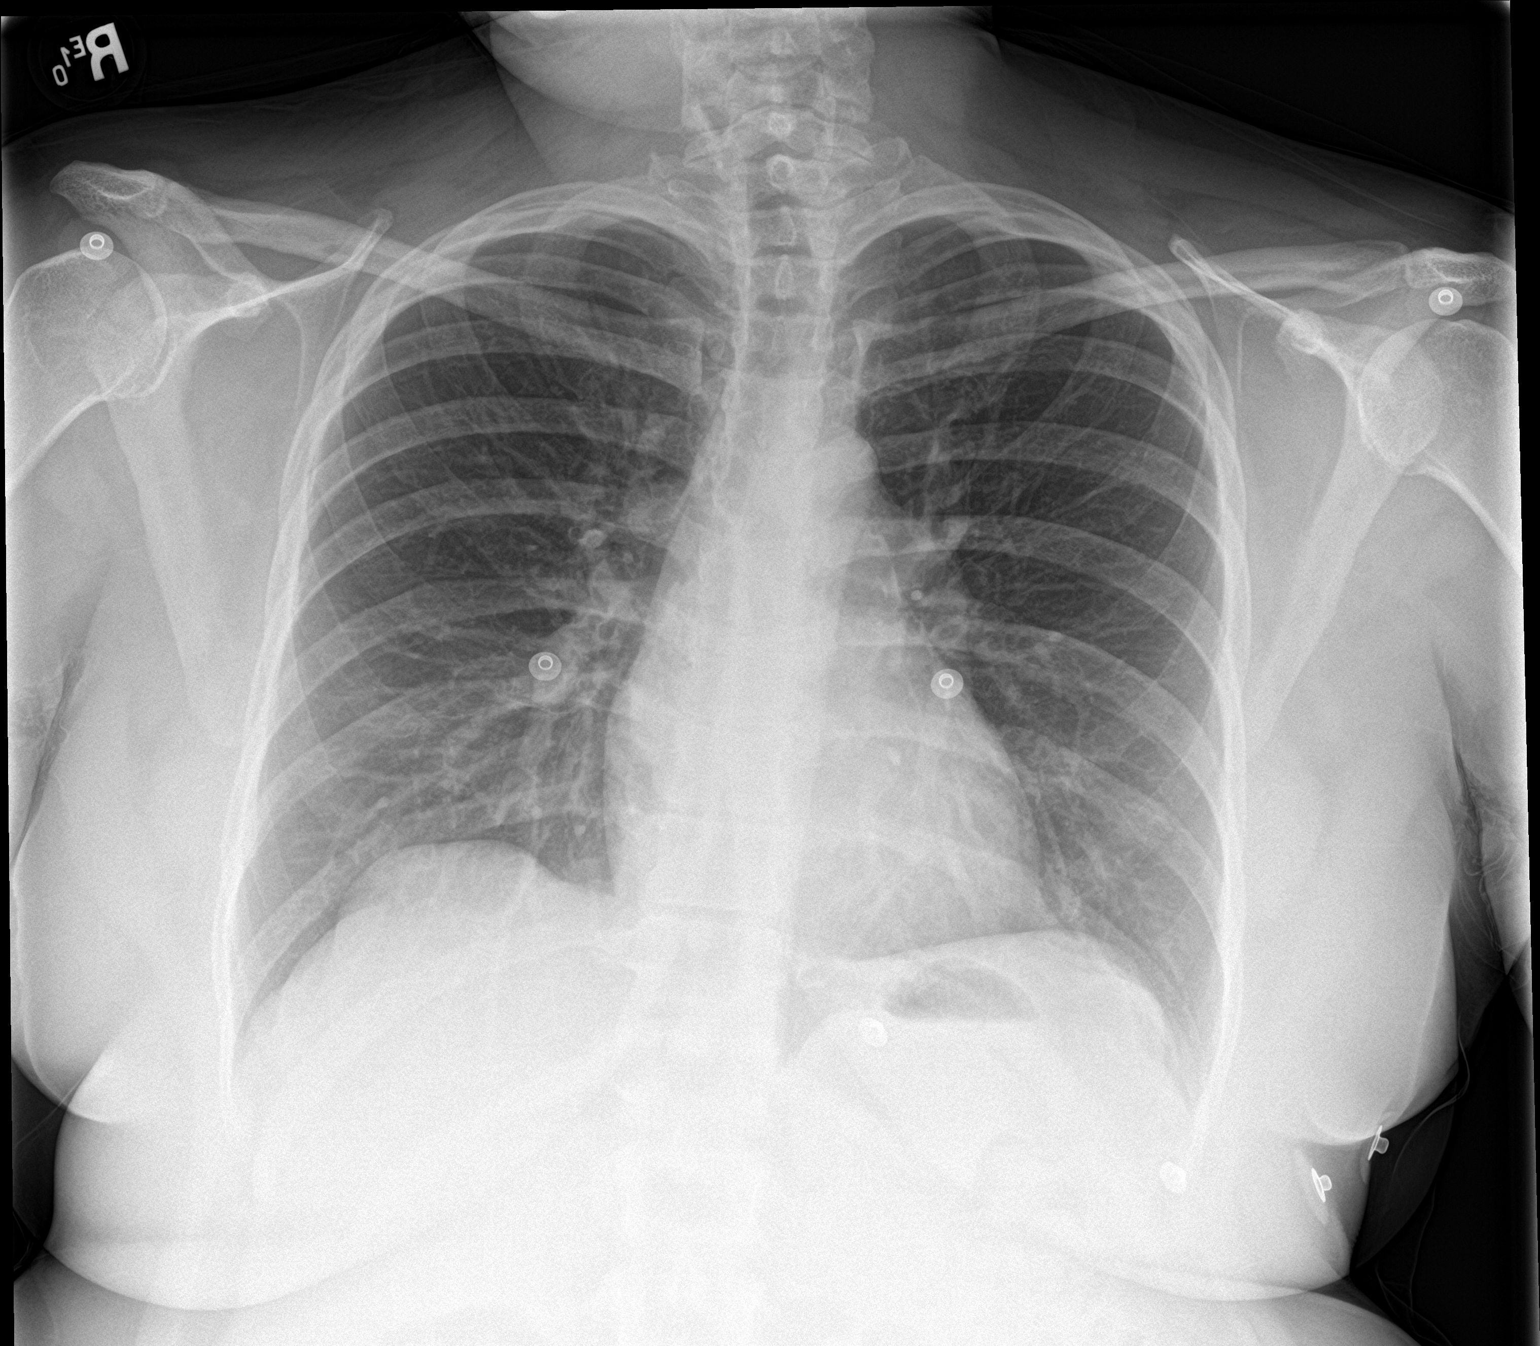

[chest lat]
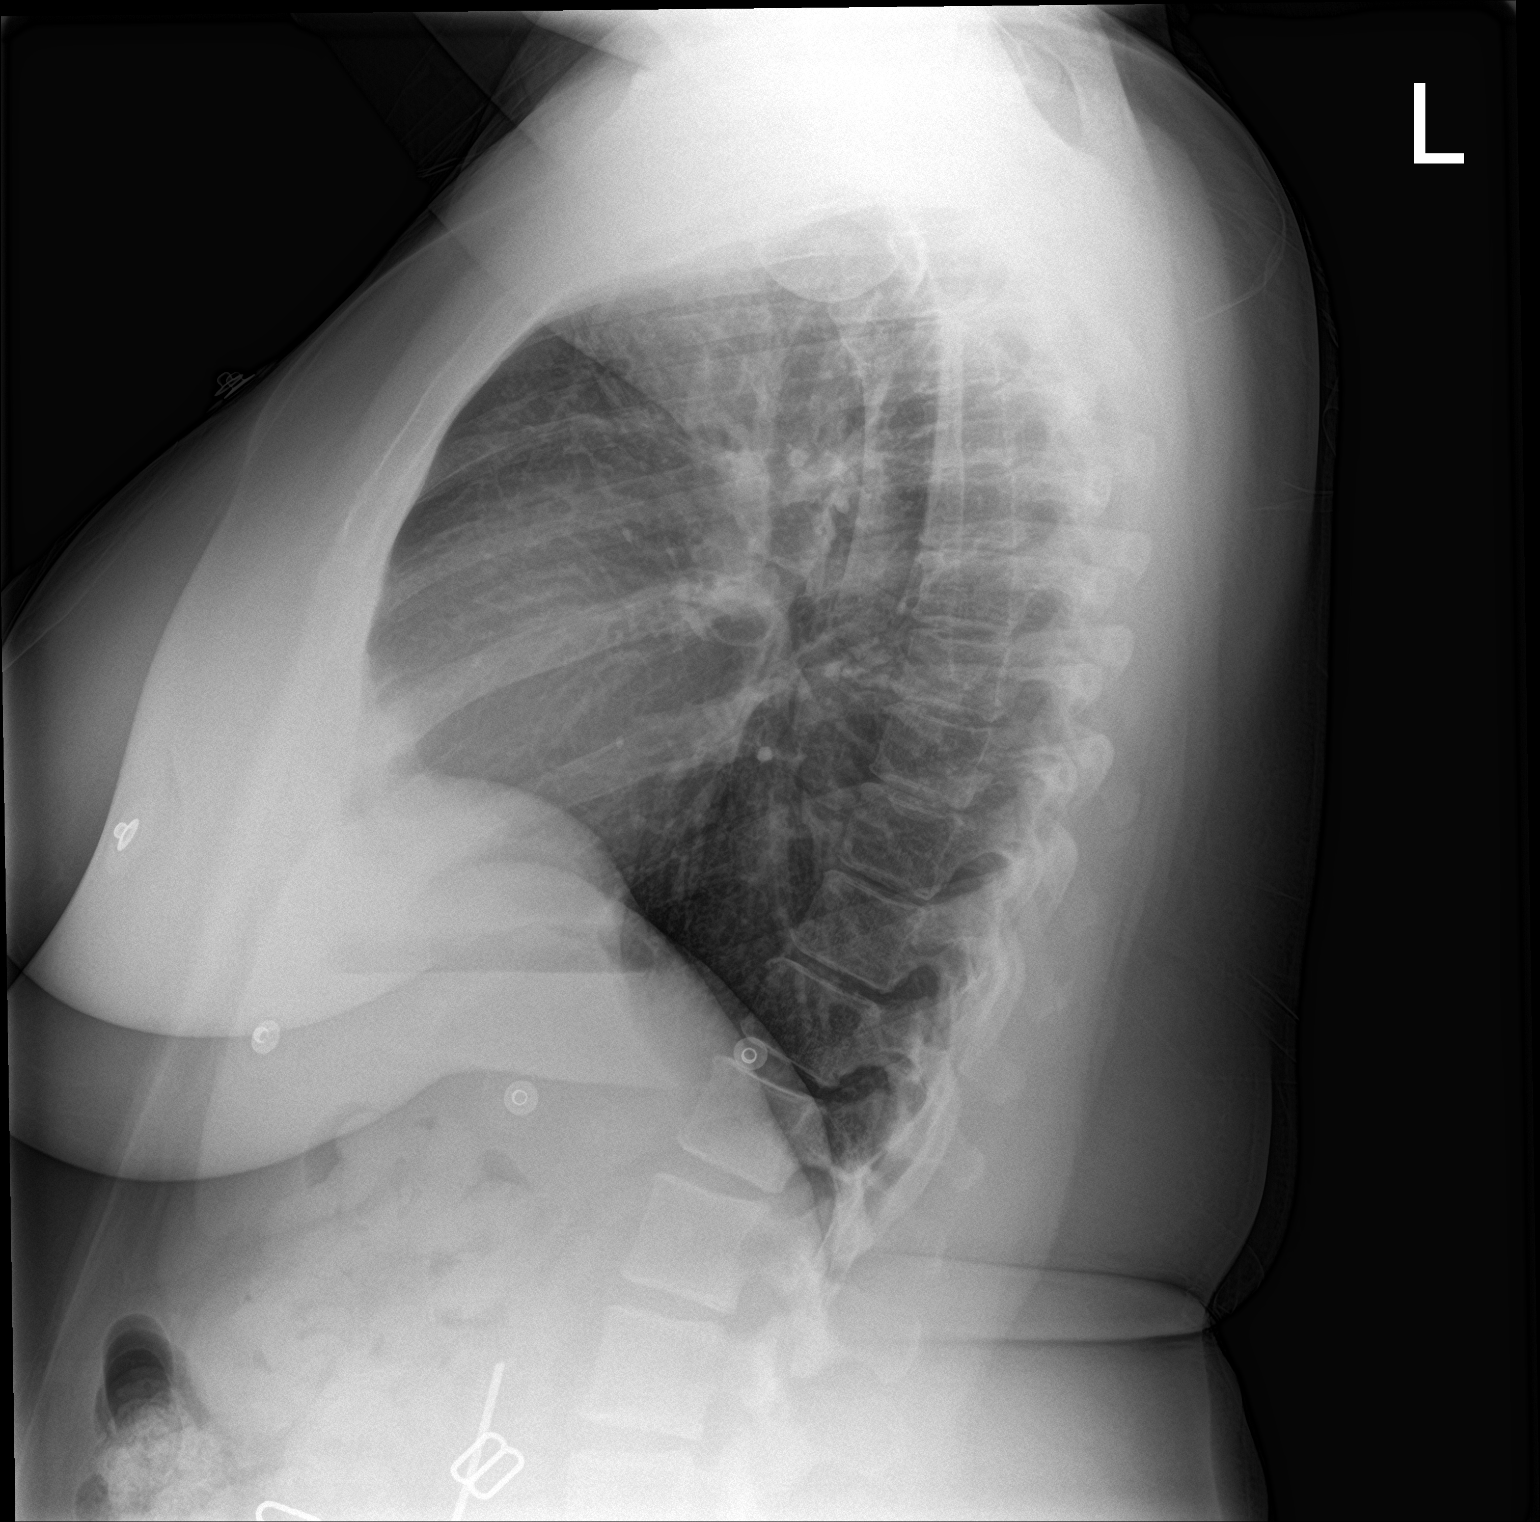

[2 of 2 positions shown; findings below may reference images not displayed]

FINDINGS: Heart size within normal limits. No appreciable airspace
consolidation. No evidence of pleural effusion or pneumothorax. No
acute bony abnormality identified.
IMPRESSION: No evidence of active cardiopulmonary disease.

## 2022-09-11 NOTE — Telephone Encounter (Signed)
Inbound call from patient requesting a call back regarding questions she had regarding medication she is taking before 7/31 colonoscopy. Please advise, thank you.

## 2022-09-11 NOTE — Telephone Encounter (Signed)
Call returned all questions answered  

## 2022-09-13 ENCOUNTER — Encounter: Payer: Self-pay | Admitting: Internal Medicine

## 2022-09-19 NOTE — Telephone Encounter (Signed)
Inbound call from patient requesting to speak with a nurse in regards to medication. Please advise.   Thank you

## 2022-09-19 NOTE — Telephone Encounter (Signed)
Pt had concerns with taking her B/P med before procedure tomorrow stated "I've noticed its a lot lower since I've been on this clear liquid die. RN suggested she hold B/P med till after procedure. Pt states she will take it after she gets home and procedure is done.

## 2022-09-20 ENCOUNTER — Ambulatory Visit: Payer: No Typology Code available for payment source | Admitting: Internal Medicine

## 2022-09-20 ENCOUNTER — Encounter: Payer: Self-pay | Admitting: Internal Medicine

## 2022-09-20 VITALS — BP 142/71 | HR 84 | Temp 97.5°F | Resp 15 | Ht 63.0 in | Wt 223.0 lb

## 2022-09-20 DIAGNOSIS — Z1211 Encounter for screening for malignant neoplasm of colon: Secondary | ICD-10-CM

## 2022-09-20 DIAGNOSIS — D123 Benign neoplasm of transverse colon: Secondary | ICD-10-CM

## 2022-09-20 DIAGNOSIS — K635 Polyp of colon: Secondary | ICD-10-CM

## 2022-09-20 DIAGNOSIS — D125 Benign neoplasm of sigmoid colon: Secondary | ICD-10-CM

## 2022-09-20 MED ORDER — SODIUM CHLORIDE 0.9 % IV SOLN
500.0000 mL | Freq: Once | INTRAVENOUS | Status: DC
Start: 2022-09-20 — End: 2022-09-20

## 2022-09-20 NOTE — Op Note (Signed)
[facility] Patient Name: Amanda Cox Procedure Date: 09/20/2022 3:37 PM MRN: [id number] Endoscopist: Beverley Fiedler , MD, [id number] Age: 47 Referring MD:  Date of Birth: January 15, 1976 Gender: Female Account #: [id number] Procedure:                Colonoscopy Indications:              Screening for colorectal malignant neoplasm, This                            is the patient's first colonoscopy Medicines:                Propofol per Anesthesia Procedure:                Pre-Anesthesia Assessment:                           - Prior to the procedure, a History and Physical                            was performed, and patient medications and                            allergies were reviewed. The patient's tolerance of                            previous anesthesia was also reviewed. The risks                            and benefits of the procedure and the sedation                            options and risks were discussed with the patient.                            All questions were answered, and informed consent                            was obtained. Prior Anticoagulants: The patient has                            taken no anticoagulant or antiplatelet agents. ASA                            Grade Assessment: II - A patient with mild systemic                            disease. After reviewing the risks and benefits,                            the patient was deemed in satisfactory condition to                            undergo the procedure.  After obtaining informed consent, the colonoscope                            was passed under direct vision. Throughout the                            procedure, the patient's blood pressure, pulse, and                            oxygen saturations were monitored continuously. The                            Olympus Scope SN: [id number] was introduced through                            the anus and advanced to  the cecum, identified by                            the appendiceal orifice, IC valve and                            transillumination. The colonoscopy was performed                            without difficulty. The patient tolerated the                            procedure well. The quality of the bowel                            preparation was good. The ileocecal valve,                            appendiceal orifice, and rectum were photographed. Scope In: 3:42:48 PM Scope Out: 3:55:35 PM Scope Withdrawal Time: 0 hours 9 minutes 26 seconds  Total Procedure Duration: 0 hours 12 minutes 47 seconds  Findings:                 The digital rectal exam was normal.                           A 4 mm polyp was found in the hepatic flexure. The                            polyp was sessile. The polyp was removed with a                            cold snare. Resection and retrieval were complete.                           A 4 mm polyp was found in the sigmoid colon. The                            polyp was  sessile. The polyp was removed with a                            cold snare. Resection and retrieval were complete.                           Multiple large-mouthed and medium-mouthed                            diverticula were found in the hepatic flexure and                            ascending colon.                           The retroflexed view of the distal rectum and anal                            verge was normal and showed no anal or rectal                            abnormalities. Complications:            No immediate complications. Estimated Blood Loss:     Estimated blood loss was minimal. Impression:               - One 4 mm polyp at the hepatic flexure, removed                            with a cold snare. Resected and retrieved.                           - One 4 mm polyp in the sigmoid colon, removed with                            a cold snare. Resected and retrieved.                            - Mild diverticulosis at the hepatic flexure and in                            the ascending colon.                           - The distal rectum and anal verge are normal on                            retroflexion view. Recommendation:           - Patient has a contact number available for                            emergencies. The signs and symptoms of potential  delayed complications were discussed with the                            patient. Return to normal activities tomorrow.                            Written discharge instructions were provided to the                            patient.                           - Resume previous diet.                           - Continue present medications.                           - Await pathology results.                           - Repeat colonoscopy is recommended. The                            colonoscopy date will be determined after pathology                            results from today's exam become available for                            review. Beverley Fiedler, MD 09/20/2022 3:58:34 PM This report has been signed electronically.

## 2022-09-20 NOTE — Progress Notes (Signed)
Report to PACU, RN, vss, BBS= Clear.  

## 2022-09-20 NOTE — Patient Instructions (Signed)

## 2022-09-20 NOTE — Progress Notes (Signed)
Called to room to assist during endoscopic procedure.  Patient ID and intended procedure confirmed with present staff. Received instructions for my participation in the procedure from the performing physician.  

## 2022-09-20 NOTE — Progress Notes (Signed)
GASTROENTEROLOGY PROCEDURE H&P NOTE   Primary Care Physician: Ollen Bowl, MD    Reason for Procedure:  Colon cancer screening  Plan:    Colonoscopy  Patient is appropriate for endoscopic procedure(s) in the ambulatory ([facility]) setting.  The nature of the procedure, as well as the risks, benefits, and alternatives were carefully and thoroughly reviewed with the patient. Ample time for discussion and questions allowed. The patient understood, was satisfied, and agreed to proceed.     HPI: Amanda Cox is a 47 y.o. female who presents for screening colonoscopy.  Medical history as below.  Tolerated the prep.  No recent chest pain or shortness of breath.  No abdominal pain today.  Past Medical History:  Diagnosis Date   Chronic insomnia 11/08/2016   Dyspareunia    Essential hypertension, benign    Genital herpes, unspecified    GERD (gastroesophageal reflux disease)    Headache syndrome 11/08/2016   Irregular menstrual cycle    Lumbar spondylosis    Meralgia paraesthetica, right 09/10/2020   Neuralgia and neuritis, unspecified    Surveillance of previously prescribed contraceptive pill     Past Surgical History:  Procedure Laterality Date   BREAST REDUCTION SURGERY  2005   CESAREAN SECTION  2003    Prior to Admission medications   Medication Sig Start Date End Date Taking? Authorizing Provider  Multiple Vitamin (MULTIVITAMIN) tablet Take 1 tablet by mouth daily.   Yes [provider]  Norgestimate-Ethinyl Estradiol Triphasic (TRI-LO-MILI) 0.18/0.215/0.25 MG-25 MCG tab Take 1 tablet by mouth daily. 07/11/22  Yes Brock Bad, MD  olmesartan (BENICAR) 40 MG tablet Take 40 mg by mouth daily. 01/23/22  Yes [provider]  OVER THE COUNTER MEDICATION Vitamin k 2 plus D3. One tablet daily.   Yes [provider]  torsemide (DEMADEX) 20 MG tablet Take by mouth. 01/23/22  Yes [provider]  albuterol (PROVENTIL) (2.5  MG/3ML) 0.083% nebulizer solution Take 3 mLs (2.5 mg total) by nebulization every 6 (six) hours as needed. 02/17/22   Rising, Lurena Joiner, PA-C  albuterol (VENTOLIN HFA) 108 (90 Base) MCG/ACT inhaler Inhale 2 puffs into the lungs every 6 (six) hours. 02/17/22   Rising, Lurena Joiner, PA-C  fluconazole (DIFLUCAN) 200 MG tablet Take 1 tablet (200 mg total) by mouth every 3 (three) days. 07/11/22   Brock Bad, MD  fluticasone (FLONASE) 50 MCG/ACT nasal spray Place 2 sprays into both nostrils daily.    [provider]  guaifenesin (HUMIBID E) 400 MG TABS tablet Take 1 tablet 3 times daily as needed for chest congestion and cough 08/24/22   Theadora Rama Scales, PA-C  ibuprofen (ADVIL) 800 MG tablet Take 1 tablet (800 mg total) by mouth every 8 (eight) hours as needed. 07/11/22   Brock Bad, MD  ipratropium (ATROVENT) 0.06 % nasal spray Place into both nostrils. 08/24/22   [provider]  lidocaine (XYLOCAINE) 2 % solution Use as directed 15 mLs in the mouth or throat every 3 (three) hours as needed for mouth pain (Sore throat). 08/24/22   Theadora Rama Scales, PA-C  metFORMIN (GLUCOPHAGE-XR) 500 MG 24 hr tablet Take 2 tablets (1,000 mg total) by mouth 2 (two) times daily with a meal. Patient not taking: Reported on 08/22/2022 07/12/22   Brock Bad, MD  metroNIDAZOLE (FLAGYL) 500 MG tablet Take 1 tablet (500 mg total) by mouth 2 (two) times daily. 08/07/22   Brock Bad, MD  metroNIDAZOLE (METROGEL) 0.75 % vaginal gel Place  1 Applicatorful vaginally 2 (two) times daily. 07/11/22   Brock Bad, MD  Misc. Devices KIT Nebulizer machine with tubing and mask. Reactive airway ICD cod J68.3 02/17/22   Rising, Lurena Joiner, PA-C  promethazine-dextromethorphan (PROMETHAZINE-DM) 6.25-15 MG/5ML syrup Take 5 mLs by mouth 4 (four) times daily as needed for cough. 08/24/22   Theadora Rama Scales, PA-C  VALTREX 500 MG tablet Take 1 tablet (500 mg total) by mouth daily. 07/11/22   Brock Bad, MD    Current Outpatient Medications  Medication Sig Dispense Refill   Multiple Vitamin (MULTIVITAMIN) tablet Take 1 tablet by mouth daily.     Norgestimate-Ethinyl Estradiol Triphasic (TRI-LO-MILI) 0.18/0.215/0.25 MG-25 MCG tab Take 1 tablet by mouth daily. 28 tablet 11   olmesartan (BENICAR) 40 MG tablet Take 40 mg by mouth daily.     OVER THE COUNTER MEDICATION Vitamin k 2 plus D3. One tablet daily.     torsemide (DEMADEX) 20 MG tablet Take by mouth.     albuterol (PROVENTIL) (2.5 MG/3ML) 0.083% nebulizer solution Take 3 mLs (2.5 mg total) by nebulization every 6 (six) hours as needed. 75 mL 2   albuterol (VENTOLIN HFA) 108 (90 Base) MCG/ACT inhaler Inhale 2 puffs into the lungs every 6 (six) hours. 18 g 2   fluconazole (DIFLUCAN) 200 MG tablet Take 1 tablet (200 mg total) by mouth every 3 (three) days. 3 tablet 2   fluticasone (FLONASE) 50 MCG/ACT nasal spray Place 2 sprays into both nostrils daily.     guaifenesin (HUMIBID E) 400 MG TABS tablet Take 1 tablet 3 times daily as needed for chest congestion and cough 30 tablet 0   ibuprofen (ADVIL) 800 MG tablet Take 1 tablet (800 mg total) by mouth every 8 (eight) hours as needed. 30 tablet 5   ipratropium (ATROVENT) 0.06 % nasal spray Place into both nostrils.     lidocaine (XYLOCAINE) 2 % solution Use as directed 15 mLs in the mouth or throat every 3 (three) hours as needed for mouth pain (Sore throat). 300 mL 0   metFORMIN (GLUCOPHAGE-XR) 500 MG 24 hr tablet Take 2 tablets (1,000 mg total) by mouth 2 (two) times daily with a meal. (Patient not taking: Reported on 08/22/2022) 500 tablet 120   metroNIDAZOLE (FLAGYL) 500 MG tablet Take 1 tablet (500 mg total) by mouth 2 (two) times daily. 14 tablet 0   metroNIDAZOLE (METROGEL) 0.75 % vaginal gel Place 1 Applicatorful vaginally 2 (two) times daily. 70 g 0   Misc. Devices KIT Nebulizer machine with tubing and mask. Reactive airway ICD cod J68.3 1 kit 0   promethazine-dextromethorphan  (PROMETHAZINE-DM) 6.25-15 MG/5ML syrup Take 5 mLs by mouth 4 (four) times daily as needed for cough. 118 mL 0   VALTREX 500 MG tablet Take 1 tablet (500 mg total) by mouth daily. 30 tablet 11   Current Facility-Administered Medications  Medication Dose Route Frequency Provider Last Rate Last Admin   0.9 %  sodium chloride infusion  500 mL Intravenous Once Wyman Meschke, Carie Caddy, MD        Allergies as of 09/20/2022 - Review Complete 08/24/2022  Allergen Reaction Noted   Sulfa antibiotics Shortness Of Breath 09/07/2010    Family History  Problem Relation Age of Onset   Hypertension Mother    Diabetes Mother    Hyperlipidemia Mother    Arthritis Mother    Hypertension Father    Diabetes Father    Arthritis Sister    Diabetes Sister    Arthritis  Maternal Grandmother    Arthritis Paternal Grandmother    Hypertension Paternal Grandmother    Congenital heart disease Other    Colon cancer Neg Hx    Rectal cancer Neg Hx    Esophageal cancer Neg Hx    Stomach cancer Neg Hx    Liver cancer Neg Hx     Social History   Socioeconomic History   Marital status: Single    Spouse name: Not on file   Number of children: 1   Years of education: College   Highest education level: Not on file  Occupational History    Employer: [employer]  Tobacco Use   Smoking status: Never    Passive exposure: Never   Smokeless tobacco: Never  Vaping Use   Vaping status: Never Used  Substance and Sexual Activity   Alcohol use: Yes    Alcohol/week: 0.0 standard drinks of alcohol    Comment: rare- once every 3 months   Drug use: No   Sexual activity: Yes    Partners: Male    Birth control/protection: OCP  Other Topics Concern   Not on file  Social History Narrative   Patient is single and lives at home, her daughter lives with her.   Patient has a college education.   Patient is right-handed.   Caffeine use:  1-2 daily   Patient has one child.   Social Determinants of Health   Financial  Resource Strain: Not on file  Food Insecurity: Not on file  Transportation Needs: Not on file  Physical Activity: Not on file  Stress: Not on file  Social Connections: Not on file  Intimate Partner Violence: Not on file    Physical Exam: Vital signs in last 24 hours: @BP  (!) 147/87   Pulse 98   Temp (!) 97.5 F (36.4 C) (Temporal)   Ht 5\' 3"  (1.6 m)   Wt 223 lb (101.2 kg)   LMP 08/31/2022   SpO2 99%   BMI 39.50 kg/m  GEN: NAD EYE: Sclerae anicteric ENT: MMM CV: Non-tachycardic Pulm: CTA b/l GI: Soft, NT/ND NEURO:  Alert & Oriented x 3   Erick Blinks, MD [facility]  09/20/2022 3:32 PM

## 2022-09-22 ENCOUNTER — Telehealth: Payer: Self-pay

## 2022-09-22 NOTE — Telephone Encounter (Signed)
  Follow up Call-     09/20/2022    3:14 PM  Call back number  Post procedure Call Back phone  # [phone]  Permission to leave phone message Yes     Patient questions:  Do you have a fever, pain , or abdominal swelling? No. Pain Score  0 *  Have you tolerated food without any problems? Yes.    Have you been able to return to your normal activities? Yes.    Do you have any questions about your discharge instructions: Diet   No. Medications  No. Follow up visit  No.  Do you have questions or concerns about your Care? No.  Actions: * If pain score is 4 or above: No action needed, pain <4.

## 2022-09-24 ENCOUNTER — Ambulatory Visit (HOSPITAL_COMMUNITY)
Admission: EM | Admit: 2022-09-24 | Discharge: 2022-09-24 | Disposition: A | Discharge status: Home / Self Care | Payer: No Typology Code available for payment source | Attending: Physician Assistant | Admitting: Physician Assistant

## 2022-09-24 ENCOUNTER — Encounter (HOSPITAL_COMMUNITY): Payer: Self-pay | Admitting: Emergency Medicine

## 2022-09-24 DIAGNOSIS — Z1152 Encounter for screening for COVID-19: Secondary | ICD-10-CM | POA: Diagnosis not present

## 2022-09-24 DIAGNOSIS — J3489 Other specified disorders of nose and nasal sinuses: Secondary | ICD-10-CM | POA: Diagnosis not present

## 2022-09-24 DIAGNOSIS — Z8616 Personal history of COVID-19: Secondary | ICD-10-CM | POA: Insufficient documentation

## 2022-09-24 NOTE — Discharge Instructions (Signed)
Use nasal saline rinses as discussed

## 2022-09-24 NOTE — ED Provider Notes (Signed)
[facility]    CSN: [id number] Arrival date & time: 09/24/22  1003      History   Chief Complaint Chief Complaint  Patient presents with   Requesting a COVID test    HPI Amanda Cox is a 47 y.o. female.   Patient here requesting "COVID test". She tested positive 1 month ago.  She reports sx have resolved except for continued "tingle and burning in her nostril."  She reports she needs negative COVID test to return to work.  She has no other concerns.    Past Medical History:  Diagnosis Date   Chronic insomnia 11/08/2016   Dyspareunia    Essential hypertension, benign    Genital herpes, unspecified    GERD (gastroesophageal reflux disease)    Headache syndrome 11/08/2016   Irregular menstrual cycle    Lumbar spondylosis    Meralgia paraesthetica, right 09/10/2020   Neuralgia and neuritis, unspecified    Surveillance of previously prescribed contraceptive pill     Patient Active Problem List   Diagnosis Date Noted   Pre-diabetes 02/09/2021   Meralgia paraesthetica, right 09/10/2020   Routine general medical examination at a health care facility 08/02/2018   Constipation 04/11/2018   Chronic insomnia 11/08/2016   Snoring 08/19/2013   BMI 35.0-35.9,adult 08/19/2013   Essential hypertension 07/10/2013   Genital herpes 07/17/2012    Past Surgical History:  Procedure Laterality Date   BREAST REDUCTION SURGERY  2005   CESAREAN SECTION  2003    OB History     Gravida  2   Para  1   Term  1   Preterm      AB  1   Living  1      SAB      IAB  1   Ectopic      Multiple      Live Births  1            Home Medications    Prior to Admission medications   Medication Sig Start Date End Date Taking? Authorizing Provider  albuterol (PROVENTIL) (2.5 MG/3ML) 0.083% nebulizer solution Take 3 mLs (2.5 mg total) by nebulization every 6 (six) hours as needed. 02/17/22  Yes Rising, Lurena Joiner, PA-C  albuterol (VENTOLIN HFA) 108 (90  Base) MCG/ACT inhaler Inhale 2 puffs into the lungs every 6 (six) hours. 02/17/22  Yes Rising, Lurena Joiner, PA-C  fluconazole (DIFLUCAN) 200 MG tablet Take 1 tablet (200 mg total) by mouth every 3 (three) days. 07/11/22  Yes Brock Bad, MD  fluticasone (FLONASE) 50 MCG/ACT nasal spray Place 2 sprays into both nostrils daily.   Yes [provider]  guaifenesin (HUMIBID E) 400 MG TABS tablet Take 1 tablet 3 times daily as needed for chest congestion and cough 08/24/22  Yes Theadora Rama Scales, PA-C  ibuprofen (ADVIL) 800 MG tablet Take 1 tablet (800 mg total) by mouth every 8 (eight) hours as needed. 07/11/22  Yes Brock Bad, MD  ipratropium (ATROVENT) 0.06 % nasal spray Place into both nostrils. 08/24/22  Yes [provider]  lidocaine (XYLOCAINE) 2 % solution Use as directed 15 mLs in the mouth or throat every 3 (three) hours as needed for mouth pain (Sore throat). 08/24/22  Yes Theadora Rama Scales, PA-C  metFORMIN (GLUCOPHAGE-XR) 500 MG 24 hr tablet Take 2 tablets (1,000 mg total) by mouth 2 (two) times daily with a meal. 07/12/22  Yes Brock Bad, MD  metroNIDAZOLE (METROGEL) 0.75 % vaginal gel Place 1  Applicatorful vaginally 2 (two) times daily. 07/11/22  Yes Brock Bad, MD  Misc. Devices KIT Nebulizer machine with tubing and mask. Reactive airway ICD cod J68.3 02/17/22  Yes Rising, Lurena Joiner, PA-C  Multiple Vitamin (MULTIVITAMIN) tablet Take 1 tablet by mouth daily.   Yes [provider]  Norgestimate-Ethinyl Estradiol Triphasic (TRI-LO-MILI) 0.18/0.215/0.25 MG-25 MCG tab Take 1 tablet by mouth daily. 07/11/22  Yes Brock Bad, MD  olmesartan (BENICAR) 40 MG tablet Take 40 mg by mouth daily. 01/23/22  Yes [provider]  OVER THE COUNTER MEDICATION Vitamin k 2 plus D3. One tablet daily.   Yes [provider]  promethazine-dextromethorphan (PROMETHAZINE-DM) 6.25-15 MG/5ML syrup Take 5 mLs by mouth 4 (four) times daily as needed for  cough. 08/24/22  Yes Theadora Rama Scales, PA-C  torsemide (DEMADEX) 20 MG tablet Take by mouth. 01/23/22  Yes [provider]  VALTREX 500 MG tablet Take 1 tablet (500 mg total) by mouth daily. 07/11/22  Yes Brock Bad, MD  metroNIDAZOLE (FLAGYL) 500 MG tablet Take 1 tablet (500 mg total) by mouth 2 (two) times daily. 08/07/22   Brock Bad, MD    Family History Family History  Problem Relation Age of Onset   Hypertension Mother    Diabetes Mother    Hyperlipidemia Mother    Arthritis Mother    Hypertension Father    Diabetes Father    Arthritis Sister    Diabetes Sister    Arthritis Maternal Grandmother    Arthritis Paternal Grandmother    Hypertension Paternal Grandmother    Congenital heart disease Other    Colon cancer Neg Hx    Rectal cancer Neg Hx    Esophageal cancer Neg Hx    Stomach cancer Neg Hx    Liver cancer Neg Hx     Social History Social History   Tobacco Use   Smoking status: Never    Passive exposure: Never   Smokeless tobacco: Never  Vaping Use   Vaping status: Never Used  Substance Use Topics   Alcohol use: Yes    Alcohol/week: 0.0 standard drinks of alcohol    Comment: rare- once every 3 months   Drug use: No     Allergies   Sulfa antibiotics   Review of Systems Review of Systems  Constitutional:  Negative for chills, fatigue and fever.  HENT:  Negative for congestion, ear pain, nosebleeds, postnasal drip, rhinorrhea, sinus pressure, sinus pain, sneezing, sore throat and trouble swallowing.   Eyes:  Negative for pain and redness.  Respiratory:  Negative for cough, shortness of breath and wheezing.   Gastrointestinal:  Negative for abdominal pain, diarrhea, nausea and vomiting.  Musculoskeletal:  Negative for arthralgias and myalgias.  Skin:  Negative for rash.  Neurological:  Negative for light-headedness and headaches.  Hematological:  Negative for adenopathy. Does not bruise/bleed easily.  Psychiatric/Behavioral:   Negative for confusion and sleep disturbance.      Physical Exam Triage Vital Signs ED Triage Vitals  Encounter Vitals Group     BP 09/24/22 1025 125/79     Systolic BP Percentile --      Diastolic BP Percentile --      Pulse Rate 09/24/22 1025 82     Resp 09/24/22 1025 16     Temp 09/24/22 1025 98.9 F (37.2 C)     Temp Source 09/24/22 1025 Oral     SpO2 09/24/22 1025 96 %     Weight 09/24/22 1028 221 lb (100.2  kg)     Height 09/24/22 1028 5\' 3"  (1.6 m)     Head Circumference --      Peak Flow --      Pain Score 09/24/22 1027 0     Pain Loc --      Pain Education --      Exclude from Growth Chart --    No data found.  Updated Vital Signs BP 125/79 (BP Location: Right Arm)   Pulse 82   Temp 98.9 F (37.2 C) (Oral)   Resp 16   Ht 5\' 3"  (1.6 m)   Wt 221 lb (100.2 kg)   LMP 08/31/2022   SpO2 96%   BMI 39.15 kg/m   Visual Acuity Right Eye Distance:   Left Eye Distance:   Bilateral Distance:    Right Eye Near:   Left Eye Near:    Bilateral Near:     Physical Exam Vitals and nursing note reviewed.  Constitutional:      General: She is not in acute distress.    Appearance: Normal appearance. She is not ill-appearing.  HENT:     Head: Normocephalic and atraumatic.     Nose: No congestion or rhinorrhea.     Mouth/Throat:     Pharynx: No oropharyngeal exudate or posterior oropharyngeal erythema.  Eyes:     General: No scleral icterus.    Extraocular Movements: Extraocular movements intact.     Conjunctiva/sclera: Conjunctivae normal.  Pulmonary:     Effort: Pulmonary effort is normal. No respiratory distress.  Musculoskeletal:     Cervical back: Normal range of motion. No rigidity.  Skin:    Coloration: Skin is not jaundiced.     Findings: No rash.  Neurological:     General: No focal deficit present.     Mental Status: She is alert and oriented to person, place, and time.     Motor: No weakness.     Gait: Gait normal.  Psychiatric:        Mood and  Affect: Mood normal.        Behavior: Behavior normal.      UC Treatments / Results  Labs (all labs ordered are listed, but only abnormal results are displayed) Labs Reviewed  SARS CORONAVIRUS 2 (TAT 6-24 HRS)    EKG   Radiology No results found.  Procedures Procedures (including critical care time)  Medications Ordered in UC Medications - No data to display  Initial Impression / Assessment and Plan / UC Course  I have reviewed the triage vital signs and the nursing notes.  Pertinent labs & imaging results that were available during my care of the patient were reviewed by me and considered in my medical decision making (see chart for details).     COVID test performed, results should be available via mychart in 1 - 2 days. Use nasal saline rinses as discussed Final Clinical Impressions(s) / UC Diagnoses   Final diagnoses:  History of COVID-19     Discharge Instructions      Use nasal saline rinses as discussed    ED Prescriptions   None    PDMP not reviewed this encounter.   Evern Core, PA-C 09/24/22 1054

## 2022-09-24 NOTE — ED Triage Notes (Signed)
Patient requesting a COVID test.  Patient tested positive for COVID on July 5th.  Patient having tingling on and off in her nose.  Denies any other sx's.

## 2022-09-28 ENCOUNTER — Encounter: Payer: Self-pay | Admitting: Internal Medicine

## 2022-11-23 ENCOUNTER — Ambulatory Visit
Admission: RE | Admit: 2022-11-23 | Discharge: 2022-11-23 | Disposition: A | Discharge status: Home / Self Care | Payer: No Typology Code available for payment source | Source: Ambulatory Visit | Attending: Internal Medicine | Admitting: Internal Medicine

## 2022-11-23 DIAGNOSIS — Z1231 Encounter for screening mammogram for malignant neoplasm of breast: Secondary | ICD-10-CM

## 2023-01-09 ENCOUNTER — Ambulatory Visit (HOSPITAL_COMMUNITY)
Admission: EM | Admit: 2023-01-09 | Discharge: 2023-01-09 | Disposition: A | Discharge status: Home / Self Care | Payer: No Typology Code available for payment source | Attending: Physician Assistant | Admitting: Physician Assistant

## 2023-01-09 ENCOUNTER — Encounter (HOSPITAL_COMMUNITY): Payer: Self-pay | Admitting: *Deleted

## 2023-01-09 DIAGNOSIS — J019 Acute sinusitis, unspecified: Secondary | ICD-10-CM

## 2023-01-09 MED ORDER — AMOXICILLIN-POT CLAVULANATE 875-125 MG PO TABS: 1.0000 | ORAL_TABLET | Freq: Two times a day (BID) | ORAL | 0 refills | Status: DC

## 2023-01-09 NOTE — Discharge Instructions (Signed)
Take antibiotic as prescribed Continue with Flonase and Mucinex Can take Ibuprofen or Tylenol as needed Drink plenty of fluids

## 2023-01-09 NOTE — ED Triage Notes (Signed)
Pt reports having a sinus infection for 5 weeks. Pr has tried OTC with out relief.

## 2023-01-25 ENCOUNTER — Ambulatory Visit: Payer: No Typology Code available for payment source | Admitting: Nurse Practitioner

## 2023-01-25 ENCOUNTER — Encounter: Payer: Self-pay | Admitting: Nurse Practitioner

## 2023-01-25 VITALS — BP 122/76 | HR 96 | Ht 63.0 in | Wt 218.0 lb

## 2023-01-25 DIAGNOSIS — Z6838 Body mass index (BMI) 38.0-38.9, adult: Secondary | ICD-10-CM | POA: Diagnosis not present

## 2023-01-25 DIAGNOSIS — E669 Obesity, unspecified: Secondary | ICD-10-CM

## 2023-01-25 DIAGNOSIS — Z860101 Personal history of adenomatous and serrated colon polyps: Secondary | ICD-10-CM | POA: Diagnosis not present

## 2023-01-25 DIAGNOSIS — R195 Other fecal abnormalities: Secondary | ICD-10-CM

## 2023-01-25 NOTE — Progress Notes (Signed)
Brief Narrative 47 y.o. yo female known to Dr. Rhea Belton with a past medical history not limited to HTN, obesity, chronic constipation.  Patient was last seen here at time of her screening colonoscopy July 2024   ASSESSMENT    Abnormal stool contents / patient concerned about intestinal parasites.   Patient describes what she believes to be a worm in recent BM.   Obesity Recently started Qsymia  History of colon polyps.. Small tubular adenoma removed in July 2024   PLAN    Will send stool for O+P  HPI   Chief complaint : saw what appeared to be a worm in [facility]  [place] believes she saw a worm in her stool several days ago. She  hasn't had any diarrhea, N/V.  No abdominal pain. No unexpected weight loss. She has been having abdominal bloating and excessive intestinal gas for about 3 months. She called ID for an appt but was told she needed a referral.   No worms or inflammation on screening colonoscopy in July 2024   GI History / Studies   **May not be a complete list of studies  Screening colonoscopy July 2024 - One 4 mm polyp at the hepatic flexure, removed with a cold snare. Resected and retrieved. - One 4 mm polyp in the sigmoid colon, removed with a cold snare. Resected and retrieved. - Mild diverticulosis at the hepatic flexure and in the ascending colon. - The distal rectum and anal verge are normal on retroflexion view.  Diagnosis 1. Surgical [P], colon, hepatic flexure, polyp (1) TUBULAR ADENOMA. NEGATIVE FOR HIGH-GRADE DYSPLASIA OR MALIGNANCY. 2. Surgical [P], colon, sigmoid, polyp (1) HYPERPLASTIC POLYP. NEGATIVE FOR DYSPLASIA OR MALIGNANCY.   Labs      Latest Ref Rng & Units 02/12/2022    9:30 AM 03/23/2021    6:32 PM 12/08/2020    1:38 PM  CBC  WBC 4.0 - 10.5 K/uL 5.3  9.6  4.9   Hemoglobin 12.0 - 15.0 g/dL 29.5  62.1  30.8   Hematocrit 36.0 - 46.0 % 35.0  40.3  39.7   Platelets 150 - 400 K/uL 302  416  337     Lab Results  Component Value Date    LIPASE 24 12/08/2020      Latest Ref Rng & Units 02/12/2022    9:30 AM 03/23/2021    6:32 PM 12/08/2020    1:38 PM  CMP  Glucose 70 - 99 mg/dL 657  846  962   BUN 6 - 20 mg/dL 10  8  7    Creatinine 0.44 - 1.00 mg/dL 9.52  8.41  3.24   Sodium 135 - 145 mmol/L 132  136  135   Potassium 3.5 - 5.1 mmol/L 4.2  4.2  3.3   Chloride 98 - 111 mmol/L 103  98  98   CO2 22 - 32 mmol/L 22  28  26    Calcium 8.9 - 10.3 mg/dL 8.7  9.4  9.3   Total Protein 6.5 - 8.1 g/dL  7.4  7.3   Total Bilirubin 0.3 - 1.2 mg/dL  0.8  0.3   Alkaline Phos 38 - 126 U/L  50  63   AST 15 - 41 U/L  15  27   ALT 0 - 44 U/L  15  20      Past Medical History:  Diagnosis Date   Chronic insomnia 11/08/2016   Dyspareunia    Essential hypertension, benign    Genital herpes,  unspecified    GERD (gastroesophageal reflux disease)    Headache syndrome 11/08/2016   Irregular menstrual cycle    Lumbar spondylosis    Meralgia paraesthetica, right 09/10/2020   Neuralgia and neuritis, unspecified    Surveillance of previously prescribed contraceptive pill    Past Surgical History:  Procedure Laterality Date   BREAST REDUCTION SURGERY  2005   CESAREAN SECTION  2003   REDUCTION MAMMAPLASTY     Family History  Problem Relation Age of Onset   Hypertension Mother    Diabetes Mother    Hyperlipidemia Mother    Arthritis Mother    Hypertension Father    Diabetes Father    Arthritis Sister    Diabetes Sister    Arthritis Maternal Grandmother    Arthritis Paternal Grandmother    Hypertension Paternal Grandmother    Congenital heart disease Other    Colon cancer Neg Hx    Rectal cancer Neg Hx    Esophageal cancer Neg Hx    Stomach cancer Neg Hx    Liver cancer Neg Hx    Social History   Tobacco Use   Smoking status: Never    Passive exposure: Never   Smokeless tobacco: Never  Vaping Use   Vaping status: Never Used  Substance Use Topics   Alcohol use: Yes    Alcohol/week: 0.0 standard drinks of alcohol     Comment: rare- once every 3 months   Drug use: No   Current Outpatient Medications  Medication Sig Dispense Refill   albuterol (PROVENTIL) (2.5 MG/3ML) 0.083% nebulizer solution Take 3 mLs (2.5 mg total) by nebulization every 6 (six) hours as needed. 75 mL 2   fluticasone (FLONASE) 50 MCG/ACT nasal spray Place 2 sprays into both nostrils daily.     guaifenesin (HUMIBID E) 400 MG TABS tablet Take 1 tablet 3 times daily as needed for chest congestion and cough 30 tablet 0   ibuprofen (ADVIL) 800 MG tablet Take 1 tablet (800 mg total) by mouth every 8 (eight) hours as needed. 30 tablet 5   ipratropium (ATROVENT) 0.06 % nasal spray Place into both nostrils.     lidocaine (XYLOCAINE) 2 % solution Use as directed 15 mLs in the mouth or throat every 3 (three) hours as needed for mouth pain (Sore throat). 300 mL 0   Misc. Devices KIT Nebulizer machine with tubing and mask. Reactive airway ICD cod J68.3 1 kit 0   Multiple Vitamin (MULTIVITAMIN) tablet Take 1 tablet by mouth daily.     Norgestimate-Ethinyl Estradiol Triphasic (TRI-LO-MILI) 0.18/0.215/0.25 MG-25 MCG tab Take 1 tablet by mouth daily. 28 tablet 11   olmesartan (BENICAR) 40 MG tablet Take 40 mg by mouth daily.     OVER THE COUNTER MEDICATION Vitamin k 2 plus D3. One tablet daily.     Phentermine-Topiramate (QSYMIA PO) Take 1 tablet by mouth daily.     promethazine-dextromethorphan (PROMETHAZINE-DM) 6.25-15 MG/5ML syrup Take 5 mLs by mouth 4 (four) times daily as needed for cough. 118 mL 0   torsemide (DEMADEX) 20 MG tablet Take by mouth.     VALTREX 500 MG tablet Take 1 tablet (500 mg total) by mouth daily. 30 tablet 11   No current facility-administered medications for this visit.   Allergies  Allergen Reactions   Sulfa Antibiotics Shortness Of Breath    Review of Systems: All systems reviewed and negative except where noted in HPI.   Wt Readings from Last 3 Encounters:  01/25/23 218 lb (98.9 kg)  09/24/22 221 lb (100.2 kg)   09/20/22 223 lb (101.2 kg)    Physical Exam:  BP 122/76   Pulse 96   Ht 5\' 3"  (1.6 m)   Wt 218 lb (98.9 kg)   LMP 12/26/2022 (Approximate)   SpO2 98%   BMI 38.62 kg/m  Constitutional:  Pleasant, generally well appearing female in no acute distress. Psychiatric:  Normal mood and affect. Behavior is normal. EENT: Pupils normal.  Conjunctivae are normal. No scleral icterus. Neck supple.  Cardiovascular: Normal rate, regular rhythm.  Pulmonary/chest: Effort normal and breath sounds normal. No wheezing, rales or rhonchi. Abdominal: Soft, nondistended, nontender. Bowel sounds active throughout. There are no masses palpable. No hepatomegaly. Neurological: Alert and oriented to person place and time.  Willette Cluster, NP  01/25/2023, 3:22 PM

## 2023-01-25 NOTE — Patient Instructions (Signed)
_______________________________________________________  If your blood pressure at your visit was 140/90 or greater, please contact your primary care physician to follow up on this.  _______________________________________________________  If you are age 45 or older, your body mass index should be between 23-30. Your Body mass index is 38.62 kg/m. If this is out of the aforementioned range listed, please consider follow up with your Primary Care Provider.  If you are age 59 or younger, your body mass index should be between 19-25. Your Body mass index is 38.62 kg/m. If this is out of the aformentioned range listed, please consider follow up with your Primary Care Provider.   ________________________________________________________  The [facility] providers would like to encourage you to use [facility] to communicate with providers for non-urgent requests or questions.  Due to long hold times on the telephone, sending your provider a message by [facility] may be a faster and more efficient way to get a response.  Please allow 48 business hours for a response.  Please remember that this is for non-urgent requests.  _______________________________________________________  Your provider has requested that you go to the basement level for lab work before leaving today. Press "B" on the elevator. The lab is located at the first door on the left as you exit the elevator.  Due to recent changes in healthcare laws, you may see the results of your imaging and laboratory studies on MyChart before your provider has had a chance to review them.  We understand that in some cases there may be results that are confusing or concerning to you. Not all laboratory results come back in the same time frame and the provider may be waiting for multiple results in order to interpret others.  Please give [place] 48 hours in order for your provider to thoroughly review all the results before contacting the office for clarification of  your results.   Thank you for entrusting me with your care and choosing [facility].  Willette Cluster, NP

## 2023-01-31 ENCOUNTER — Other Ambulatory Visit: Payer: No Typology Code available for payment source

## 2023-01-31 DIAGNOSIS — R195 Other fecal abnormalities: Secondary | ICD-10-CM

## 2023-02-04 ENCOUNTER — Ambulatory Visit (HOSPITAL_COMMUNITY)
Admission: EM | Admit: 2023-02-04 | Discharge: 2023-02-04 | Disposition: A | Discharge status: Home / Self Care | Payer: No Typology Code available for payment source | Attending: Emergency Medicine | Admitting: Emergency Medicine

## 2023-02-04 ENCOUNTER — Encounter (HOSPITAL_COMMUNITY): Payer: Self-pay

## 2023-02-04 DIAGNOSIS — J019 Acute sinusitis, unspecified: Secondary | ICD-10-CM

## 2023-02-04 DIAGNOSIS — B9689 Other specified bacterial agents as the cause of diseases classified elsewhere: Secondary | ICD-10-CM | POA: Diagnosis not present

## 2023-02-04 LAB — POC COVID19/FLU A&B COMBO
Covid Antigen, POC: NEGATIVE
Influenza A Antigen, POC: NEGATIVE
Influenza B Antigen, POC: NEGATIVE

## 2023-02-04 MED ORDER — FLUTICASONE PROPIONATE 50 MCG/ACT NA SUSP: 1.0000 | Freq: Every day | NASAL | 2 refills | Status: AC

## 2023-02-04 MED ORDER — DOXYCYCLINE HYCLATE 100 MG PO CAPS: 100.0000 mg | ORAL_CAPSULE | Freq: Two times a day (BID) | ORAL | 0 refills | Status: DC

## 2023-02-04 NOTE — Discharge Instructions (Signed)
Your COVID and flu testing were negative.  I am treating you with doxycycline for a sinus infection.  Take this twice daily as prescribed with food and until finished.  I also suggest starting a daily sinus rinse with filtered or bottled water to help manually declog your sinuses.  You can continue with an over-the-counter Flonase spray daily as well.  If your sinus symptoms persist please follow-up with an ear nose and throat provider.   Return to clinic for any new or urgent symptoms.

## 2023-02-04 NOTE — ED Triage Notes (Signed)
Pt presents with concern for sinus infection. Pt c/o sinus pressure, nasal tingling/burning, sneezing, and cough (intermittent yellow mucus) x3 weeks. Pt was treated with amoxicillin for sinus infection on 11/19. States she felt better for approx 1 week. Would like Covid testing.

## 2023-02-04 NOTE — ED Provider Notes (Signed)
[facility]    CSN: [id number] Arrival date & time: 02/04/23  1141      History   Chief Complaint Chief Complaint  Patient presents with   Facial Pain   Nasal Congestion    HPI Amanda Cox is a 47 y.o. female.   Patient presents to clinic for nasal congestion, rhinorrhea and sinus pressure.  She was treated on 11/19 for acute bacterial sinusitis with Augmentin.  Her symptoms improved with the Augmentin but shortly returned afterwards.  She is having pain and pressure, itching and burning.  She was having some throat itching, did warm saline gargles for this and it improved.  Reports the pressure in her sinuses is very severe.  Yellow nasal drainage with a dry cough.  Denies any fevers.  No wheezing or shortness of breath.  Initially was using a Flonase nasal spray, she has since ran out of this.  She is concerned because her daughter has been out and about and is living at home.  Daughter was recently coughing and sick.  Patient requesting COVID testing.  Denies any body aches.    The history is provided by the patient and medical records.    Past Medical History:  Diagnosis Date   Chronic insomnia 11/08/2016   Dyspareunia    Essential hypertension, benign    Genital herpes, unspecified    GERD (gastroesophageal reflux disease)    Headache syndrome 11/08/2016   Irregular menstrual cycle    Lumbar spondylosis    Meralgia paraesthetica, right 09/10/2020   Neuralgia and neuritis, unspecified    Obesity (BMI 30-39.9)    Surveillance of previously prescribed contraceptive pill     Patient Active Problem List   Diagnosis Date Noted   Pre-diabetes 02/09/2021   Meralgia paraesthetica, right 09/10/2020   Routine general medical examination at a health care facility 08/02/2018   Constipation 04/11/2018   Chronic insomnia 11/08/2016   Snoring 08/19/2013   BMI 35.0-35.9,adult 08/19/2013   Essential hypertension 07/10/2013   Genital herpes 07/17/2012     Past Surgical History:  Procedure Laterality Date   BREAST REDUCTION SURGERY  2005   CESAREAN SECTION  2003   REDUCTION MAMMAPLASTY      OB History     Gravida  2   Para  1   Term  1   Preterm      AB  1   Living  1      SAB      IAB  1   Ectopic      Multiple      Live Births  1            Home Medications    Prior to Admission medications   Medication Sig Start Date End Date Taking? Authorizing Provider  doxycycline (VIBRAMYCIN) 100 MG capsule Take 1 capsule (100 mg total) by mouth 2 (two) times daily. 02/04/23  Yes Rinaldo Ratel, [place] N, FNP  fluticasone [facility]) 50 MCG/ACT nasal spray Place 1 spray into both nostrils daily. 02/04/23  Yes Rinaldo Ratel, [place] N, FNP  albuterol (PROVENTIL) (2.5 MG/3ML) 0.083% nebulizer solution Take 3 mLs (2.5 mg total) by nebulization every 6 (six) hours as needed. 02/17/22   Rising, Lurena Joiner, PA-C  guaifenesin (HUMIBID E) 400 MG TABS tablet Take 1 tablet 3 times daily as needed for chest congestion and cough 08/24/22   Theadora Rama Scales, PA-C  ibuprofen (ADVIL) 800 MG tablet Take 1 tablet (800 mg total) by mouth every 8 (eight) hours as needed.  07/11/22   Brock Bad, MD  ipratropium (ATROVENT) 0.06 % nasal spray Place into both nostrils. 08/24/22   [provider]  lidocaine (XYLOCAINE) 2 % solution Use as directed 15 mLs in the mouth or throat every 3 (three) hours as needed for mouth pain (Sore throat). 08/24/22   Theadora Rama Scales, PA-C  Misc. Devices KIT Nebulizer machine with tubing and mask. Reactive airway ICD cod J68.3 02/17/22   Rising, Lurena Joiner, PA-C  Multiple Vitamin (MULTIVITAMIN) tablet Take 1 tablet by mouth daily.    [provider]  Norgestimate-Ethinyl Estradiol Triphasic (TRI-LO-MILI) 0.18/0.215/0.25 MG-25 MCG tab Take 1 tablet by mouth daily. 07/11/22   Brock Bad, MD  olmesartan (BENICAR) 40 MG tablet Take 40 mg by mouth daily. 01/23/22   [provider]  OVER THE  COUNTER MEDICATION Vitamin k 2 plus D3. One tablet daily.    [provider]  Phentermine-Topiramate (QSYMIA PO) Take 1 tablet by mouth daily.    [provider]  promethazine-dextromethorphan (PROMETHAZINE-DM) 6.25-15 MG/5ML syrup Take 5 mLs by mouth 4 (four) times daily as needed for cough. 08/24/22   Theadora Rama Scales, PA-C  torsemide (DEMADEX) 20 MG tablet Take by mouth. 01/23/22   [provider]  VALTREX 500 MG tablet Take 1 tablet (500 mg total) by mouth daily. 07/11/22   Brock Bad, MD    Family History Family History  Problem Relation Age of Onset   Hypertension Mother    Diabetes Mother    Hyperlipidemia Mother    Arthritis Mother    Hypertension Father    Diabetes Father    Arthritis Sister    Diabetes Sister    Arthritis Maternal Grandmother    Arthritis Paternal Grandmother    Hypertension Paternal Grandmother    Congenital heart disease Other    Colon cancer Neg Hx    Rectal cancer Neg Hx    Esophageal cancer Neg Hx    Stomach cancer Neg Hx    Liver cancer Neg Hx     Social History Social History   Tobacco Use   Smoking status: Never    Passive exposure: Never   Smokeless tobacco: Never  Vaping Use   Vaping status: Never Used  Substance Use Topics   Alcohol use: Yes    Alcohol/week: 0.0 standard drinks of alcohol    Comment: rare- once every 3 months   Drug use: No     Allergies   Sulfa antibiotics   Review of Systems Review of Systems  Per HPI   Physical Exam Triage Vital Signs ED Triage Vitals  Encounter Vitals Group     BP 02/04/23 1242 129/88     Systolic BP Percentile --      Diastolic BP Percentile --      Pulse Rate 02/04/23 1242 79     Resp 02/04/23 1242 18     Temp 02/04/23 1242 98.9 F (37.2 C)     Temp Source 02/04/23 1242 Oral     SpO2 02/04/23 1242 99 %     Weight --      Height --      Head Circumference --      Peak Flow --      Pain Score 02/04/23 1241 8     Pain Loc --       Pain Education --      Exclude from Growth Chart --    No data found.  Updated Vital Signs BP 129/88 (BP Location:  Right Arm)   Pulse 79   Temp 98.9 F (37.2 C) (Oral)   Resp 18   LMP 12/26/2022 (Approximate)   SpO2 99%   Visual Acuity Right Eye Distance:   Left Eye Distance:   Bilateral Distance:    Right Eye Near:   Left Eye Near:    Bilateral Near:     Physical Exam Vitals and nursing note reviewed.  Constitutional:      Appearance: Normal appearance.  HENT:     Head: Normocephalic and atraumatic.     Right Ear: External ear normal.     Left Ear: External ear normal.     Nose: Congestion and rhinorrhea present.     Right Sinus: Maxillary sinus tenderness present.     Left Sinus: Maxillary sinus tenderness present.     Mouth/Throat:     Mouth: Mucous membranes are moist.  Eyes:     Conjunctiva/sclera: Conjunctivae normal.  Cardiovascular:     Rate and Rhythm: Normal rate and regular rhythm.     Heart sounds: Normal heart sounds. No murmur heard. Pulmonary:     Effort: Pulmonary effort is normal. No respiratory distress.     Breath sounds: Normal breath sounds.  Musculoskeletal:        General: Normal range of motion.     Cervical back: Normal range of motion.  Skin:    General: Skin is warm and dry.  Neurological:     General: No focal deficit present.     Mental Status: She is alert and oriented to person, place, and time.  Psychiatric:        Mood and Affect: Mood normal.        Behavior: Behavior normal. Behavior is cooperative.      UC Treatments / Results  Labs (all labs ordered are listed, but only abnormal results are displayed) Labs Reviewed  POC COVID19/FLU A&B COMBO    EKG   Radiology No results found.  Procedures Procedures (including critical care time)  Medications Ordered in UC Medications - No data to display  Initial Impression / Assessment and Plan / UC Course  I have reviewed the triage vital signs and the nursing  notes.  Pertinent labs & imaging results that were available during my care of the patient were reviewed by me and considered in my medical decision making (see chart for details).  Vitals and triage reviewed, patient is hemodynamically stable.  Bilateral maxillary sinus tenderness to palpation with congestion and rhinorrhea.  Symptoms have been persistent for the past month with minimal improvement with Augmentin.  Will cover with doxycycline for continued ABS.  Patient requested COVID and flu testing due to potential exposure with daughter, these were negative.  Encouraged ENT follow-up if sinus symptoms persist.  Provided with a saline rinse kit.  Plan of care, follow-up care return precautions given, no questions at this time.     Final Clinical Impressions(s) / UC Diagnoses   Final diagnoses:  Acute bacterial sinusitis     Discharge Instructions      Your COVID and flu testing were negative.  I am treating you with doxycycline for a sinus infection.  Take this twice daily as prescribed with food and until finished.  I also suggest starting a daily sinus rinse with filtered or bottled water to help manually declog your sinuses.  You can continue with an over-the-counter Flonase spray daily as well.  If your sinus symptoms persist please follow-up with an ear nose and throat provider.  Return to clinic for any new or urgent symptoms.      ED Prescriptions     Medication Sig Dispense Auth. Provider   doxycycline (VIBRAMYCIN) 100 MG capsule Take 1 capsule (100 mg total) by mouth 2 (two) times daily. 20 capsule Rinaldo Ratel, [place] N, FNP   fluticasone [facility]) 50 MCG/ACT nasal spray Place 1 spray into both nostrils daily. 9.9 mL Sharnika Binney, [place] N, FNP      PDMP not reviewed this encounter.   Donnica Jarnagin, [place] N, [place] 02/04/23 1328

## 2023-02-05 LAB — OVA AND PARASITE EXAMINATION
CONCENTRATE RESULT:: NONE SEEN
MICRO NUMBER:: 15837562
SPECIMEN QUALITY:: ADEQUATE
TRICHROME RESULT:: NONE SEEN

## 2023-02-06 NOTE — ED Provider Notes (Signed)
[facility]    CSN: [id number] Arrival date & time: 01/09/23  1758      History   Chief Complaint Chief Complaint  Patient presents with   Facial Pain    HPI Amanda Cox is a 47 y.o. female.   Pt complains of sinus pain and pressure that she reports has been going on for several weeks.  Has become worse over the last few days.  She has been taking otc cold and cough medications with minimal relief.  She denies wheezing, shortness of breath, fever, chills.     Past Medical History:  Diagnosis Date   Chronic insomnia 11/08/2016   Dyspareunia    Essential hypertension, benign    Genital herpes, unspecified    GERD (gastroesophageal reflux disease)    Headache syndrome 11/08/2016   Irregular menstrual cycle    Lumbar spondylosis    Meralgia paraesthetica, right 09/10/2020   Neuralgia and neuritis, unspecified    Obesity (BMI 30-39.9)    Surveillance of previously prescribed contraceptive pill     Patient Active Problem List   Diagnosis Date Noted   Pre-diabetes 02/09/2021   Meralgia paraesthetica, right 09/10/2020   Routine general medical examination at a health care facility 08/02/2018   Constipation 04/11/2018   Chronic insomnia 11/08/2016   Snoring 08/19/2013   BMI 35.0-35.9,adult 08/19/2013   Essential hypertension 07/10/2013   Genital herpes 07/17/2012    Past Surgical History:  Procedure Laterality Date   BREAST REDUCTION SURGERY  2005   CESAREAN SECTION  2003   REDUCTION MAMMAPLASTY      OB History     Gravida  2   Para  1   Term  1   Preterm      AB  1   Living  1      SAB      IAB  1   Ectopic      Multiple      Live Births  1            Home Medications    Prior to Admission medications   Medication Sig Start Date End Date Taking? Authorizing Provider  ibuprofen (ADVIL) 800 MG tablet Take 1 tablet (800 mg total) by mouth every 8 (eight) hours as needed. 07/11/22  Yes Brock Bad, MD   Multiple Vitamin (MULTIVITAMIN) tablet Take 1 tablet by mouth daily.   Yes [provider]  Norgestimate-Ethinyl Estradiol Triphasic (TRI-LO-MILI) 0.18/0.215/0.25 MG-25 MCG tab Take 1 tablet by mouth daily. 07/11/22  Yes Brock Bad, MD  torsemide (DEMADEX) 20 MG tablet Take by mouth. 01/23/22  Yes [provider]  albuterol (PROVENTIL) (2.5 MG/3ML) 0.083% nebulizer solution Take 3 mLs (2.5 mg total) by nebulization every 6 (six) hours as needed. 02/17/22   Rising, Lurena Joiner, PA-C  doxycycline (VIBRAMYCIN) 100 MG capsule Take 1 capsule (100 mg total) by mouth 2 (two) times daily. 02/04/23   Garrison, [place] N, FNP  fluticasone [facility]) 50 MCG/ACT nasal spray Place 1 spray into both nostrils daily. 02/04/23   Garrison, [place] N, FNP  guaifenesin (HUMIBID E) 400 MG TABS tablet Take 1 tablet 3 times daily as needed for chest congestion and cough 08/24/22   Theadora Rama Scales, PA-C  ipratropium (ATROVENT) 0.06 % nasal spray Place into both nostrils. 08/24/22   [provider]  lidocaine (XYLOCAINE) 2 % solution Use as directed 15 mLs in the mouth or throat every 3 (three) hours as needed for mouth pain (Sore throat). 08/24/22  Theadora Rama Scales, PA-C  Misc. Devices KIT Nebulizer machine with tubing and mask. Reactive airway ICD cod J68.3 02/17/22   Rising, Lurena Joiner, PA-C  olmesartan (BENICAR) 40 MG tablet Take 40 mg by mouth daily. 01/23/22   [provider]  OVER THE COUNTER MEDICATION Vitamin k 2 plus D3. One tablet daily.    [provider]  Phentermine-Topiramate (QSYMIA PO) Take 1 tablet by mouth daily.    [provider]  promethazine-dextromethorphan (PROMETHAZINE-DM) 6.25-15 MG/5ML syrup Take 5 mLs by mouth 4 (four) times daily as needed for cough. 08/24/22   Theadora Rama Scales, PA-C  VALTREX 500 MG tablet Take 1 tablet (500 mg total) by mouth daily. 07/11/22   Brock Bad, MD    Family History Family History  Problem  Relation Age of Onset   Hypertension Mother    Diabetes Mother    Hyperlipidemia Mother    Arthritis Mother    Hypertension Father    Diabetes Father    Arthritis Sister    Diabetes Sister    Arthritis Maternal Grandmother    Arthritis Paternal Grandmother    Hypertension Paternal Grandmother    Congenital heart disease Other    Colon cancer Neg Hx    Rectal cancer Neg Hx    Esophageal cancer Neg Hx    Stomach cancer Neg Hx    Liver cancer Neg Hx     Social History Social History   Tobacco Use   Smoking status: Never    Passive exposure: Never   Smokeless tobacco: Never  Vaping Use   Vaping status: Never Used  Substance Use Topics   Alcohol use: Yes    Alcohol/week: 0.0 standard drinks of alcohol    Comment: rare- once every 3 months   Drug use: No     Allergies   Sulfa antibiotics   Review of Systems Review of Systems  Constitutional:  Negative for chills and fever.  HENT:  Positive for congestion, sinus pressure and sinus pain. Negative for ear pain and sore throat.   Eyes:  Negative for pain and visual disturbance.  Respiratory:  Negative for cough and shortness of breath.   Cardiovascular:  Negative for chest pain and palpitations.  Gastrointestinal:  Negative for abdominal pain and vomiting.  Genitourinary:  Negative for dysuria and hematuria.  Musculoskeletal:  Negative for arthralgias and back pain.  Skin:  Negative for color change and rash.  Neurological:  Negative for seizures and syncope.  All other systems reviewed and are negative.    Physical Exam Triage Vital Signs ED Triage Vitals  Encounter Vitals Group     BP 01/09/23 1837 (!) 147/87     Systolic BP Percentile --      Diastolic BP Percentile --      Pulse Rate 01/09/23 1837 77     Resp 01/09/23 1837 18     Temp 01/09/23 1837 98.9 F (37.2 C)     Temp src --      SpO2 01/09/23 1837 98 %     Weight --      Height --      Head Circumference --      Peak Flow --      Pain Score  01/09/23 1832 8     Pain Loc --      Pain Education --      Exclude from Growth Chart --    No data found.  Updated Vital Signs BP (!) 147/87   Pulse 77  Temp 98.9 F (37.2 C)   Resp 18   LMP 12/26/2022 (Approximate)   SpO2 98%   Visual Acuity Right Eye Distance:   Left Eye Distance:   Bilateral Distance:    Right Eye Near:   Left Eye Near:    Bilateral Near:     Physical Exam Vitals and nursing note reviewed.  Constitutional:      General: She is not in acute distress.    Appearance: She is well-developed.  HENT:     Head: Normocephalic and atraumatic.  Eyes:     Conjunctiva/sclera: Conjunctivae normal.  Cardiovascular:     Rate and Rhythm: Normal rate and regular rhythm.     Heart sounds: No murmur heard. Pulmonary:     Effort: Pulmonary effort is normal. No respiratory distress.     Breath sounds: Normal breath sounds.  Abdominal:     Palpations: Abdomen is soft.     Tenderness: There is no abdominal tenderness.  Musculoskeletal:        General: No swelling.     Cervical back: Neck supple.  Skin:    General: Skin is warm and dry.     Capillary Refill: Capillary refill takes less than 2 seconds.  Neurological:     Mental Status: She is alert.  Psychiatric:        Mood and Affect: Mood normal.      UC Treatments / Results  Labs (all labs ordered are listed, but only abnormal results are displayed) Labs Reviewed - No data to display  EKG   Radiology No results found.  Procedures Procedures (including critical care time)  Medications Ordered in UC Medications - No data to display  Initial Impression / Assessment and Plan / UC Course  I have reviewed the triage vital signs and the nursing notes.  Pertinent labs & imaging results that were available during my care of the patient were reviewed by me and considered in my medical decision making (see chart for details).     Will treat for bacterial sinusitis.  Antibiotic prescribed.  Pt  overall well appearing in no acute distress.  Supportive care discussed.  Return precautions discussed.  Final Clinical Impressions(s) / UC Diagnoses   Final diagnoses:  Acute non-recurrent sinusitis, unspecified location     Discharge Instructions      Take antibiotic as prescribed Continue with Flonase and Mucinex Can take Ibuprofen or Tylenol as needed Drink plenty of fluids   ED Prescriptions     Medication Sig Dispense Auth. Provider   amoxicillin-clavulanate (AUGMENTIN) 875-125 MG tablet Take 1 tablet by mouth every 12 (twelve) hours. Patient not taking:  Reported on 01/25/2023 14 tablet Ward, Tylene Fantasia, PA-C      PDMP not reviewed this encounter.   Ward, Tylene Fantasia, PA-C 02/06/23 1415

## 2023-02-09 NOTE — Progress Notes (Signed)
Addendum: Reviewed and agree with assessment and management plan. Kadijah Shamoon M, MD  

## 2023-03-09 ENCOUNTER — Other Ambulatory Visit: Payer: Self-pay | Admitting: Obstetrics

## 2023-03-09 DIAGNOSIS — B3731 Acute candidiasis of vulva and vagina: Secondary | ICD-10-CM

## 2023-03-09 DIAGNOSIS — A6000 Herpesviral infection of urogenital system, unspecified: Secondary | ICD-10-CM

## 2023-03-09 DIAGNOSIS — B9689 Other specified bacterial agents as the cause of diseases classified elsewhere: Secondary | ICD-10-CM

## 2023-03-09 MED ORDER — FLUCONAZOLE 200 MG PO TABS
200.0000 mg | ORAL_TABLET | ORAL | 5 refills | Status: DC
Start: 2023-03-09 — End: 2023-06-05

## 2023-03-09 MED ORDER — METRONIDAZOLE 0.75 % VA GEL
1.0000 | Freq: Two times a day (BID) | VAGINAL | 5 refills | Status: DC
Start: 2023-03-09 — End: 2023-07-11

## 2023-03-09 MED ORDER — VALTREX 500 MG PO TABS: 500.0000 mg | ORAL_TABLET | Freq: Every day | ORAL | 11 refills | Status: DC

## 2023-03-12 ENCOUNTER — Encounter (HOSPITAL_COMMUNITY): Payer: Self-pay

## 2023-03-12 ENCOUNTER — Ambulatory Visit (HOSPITAL_COMMUNITY)
Admission: EM | Admit: 2023-03-12 | Discharge: 2023-03-12 | Disposition: A | Discharge status: Home / Self Care | Payer: No Typology Code available for payment source | Attending: Family Medicine | Admitting: Family Medicine

## 2023-03-12 DIAGNOSIS — Z202 Contact with and (suspected) exposure to infections with a predominantly sexual mode of transmission: Secondary | ICD-10-CM | POA: Insufficient documentation

## 2023-03-12 LAB — HIV ANTIBODY (ROUTINE TESTING W REFLEX): HIV Screen 4th Generation wRfx: NONREACTIVE

## 2023-03-12 LAB — RPR: RPR Ser Ql: NONREACTIVE

## 2023-03-12 NOTE — ED Provider Notes (Signed)
[facility]    CSN: [id number] Arrival date & time: 03/12/23  1032      History   Chief Complaint No chief complaint on file.   HPI Amanda Marlo Eckstrom is a 48 y.o. female.   HPI Here for testing for STDs.  Last week her physician did already send her in treatment for possible yeast or BV with Diflucan and MetroGel.  She did have some mild discharge before that was started.  No fever or abdominal pain.  She is allergic to sulfa; last menstrual cycle was 2 weeks ago  Past Medical History:  Diagnosis Date   Chronic insomnia 11/08/2016   Dyspareunia    Essential hypertension, benign    Genital herpes, unspecified    GERD (gastroesophageal reflux disease)    Headache syndrome 11/08/2016   Irregular menstrual cycle    Lumbar spondylosis    Meralgia paraesthetica, right 09/10/2020   Neuralgia and neuritis, unspecified    Obesity (BMI 30-39.9)    Surveillance of previously prescribed contraceptive pill     Patient Active Problem List   Diagnosis Date Noted   Pre-diabetes 02/09/2021   Meralgia paraesthetica, right 09/10/2020   Routine general medical examination at a health care facility 08/02/2018   Constipation 04/11/2018   Chronic insomnia 11/08/2016   Snoring 08/19/2013   BMI 35.0-35.9,adult 08/19/2013   Essential hypertension 07/10/2013   Genital herpes 07/17/2012    Past Surgical History:  Procedure Laterality Date   BREAST REDUCTION SURGERY  2005   CESAREAN SECTION  2003   REDUCTION MAMMAPLASTY      OB History     Gravida  2   Para  1   Term  1   Preterm      AB  1   Living  1      SAB      IAB  1   Ectopic      Multiple      Live Births  1            Home Medications    Prior to Admission medications   Medication Sig Start Date End Date Taking? Authorizing Provider  albuterol (PROVENTIL) (2.5 MG/3ML) 0.083% nebulizer solution Take 3 mLs (2.5 mg total) by nebulization every 6 (six) hours as needed. 02/17/22    Rising, Lurena Joiner, PA-C  fluconazole (DIFLUCAN) 200 MG tablet Take 1 tablet (200 mg total) by mouth every 3 (three) days. 03/09/23   Brock Bad, MD  fluticasone (FLONASE) 50 MCG/ACT nasal spray Place 1 spray into both nostrils daily. 02/04/23   Garrison, [place] N, FNP  ipratropium (ATROVENT) 0.06 % nasal spray Place into both nostrils. 08/24/22   [provider]  metroNIDAZOLE (METROGEL) 0.75 % vaginal gel Place 1 Applicatorful vaginally 2 (two) times daily. For 5 days. 03/09/23   Brock Bad, MD  Misc. Devices KIT Nebulizer machine with tubing and mask. Reactive airway ICD cod J68.3 02/17/22   Rising, Lurena Joiner, PA-C  Multiple Vitamin (MULTIVITAMIN) tablet Take 1 tablet by mouth daily.    [provider]  Norgestimate-Ethinyl Estradiol Triphasic (TRI-LO-MILI) 0.18/0.215/0.25 MG-25 MCG tab Take 1 tablet by mouth daily. 07/11/22   Brock Bad, MD  olmesartan (BENICAR) 40 MG tablet Take 40 mg by mouth daily. 01/23/22   [provider]  Phentermine-Topiramate (QSYMIA PO) Take 1 tablet by mouth daily.    [provider]  torsemide (DEMADEX) 20 MG tablet Take by mouth. 01/23/22   [provider]  VALTREX 500 MG  tablet Take 1 tablet (500 mg total) by mouth daily. 03/09/23   Brock Bad, MD    Family History Family History  Problem Relation Age of Onset   Hypertension Mother    Diabetes Mother    Hyperlipidemia Mother    Arthritis Mother    Hypertension Father    Diabetes Father    Arthritis Sister    Diabetes Sister    Arthritis Maternal Grandmother    Arthritis Paternal Grandmother    Hypertension Paternal Grandmother    Congenital heart disease Other    Colon cancer Neg Hx    Rectal cancer Neg Hx    Esophageal cancer Neg Hx    Stomach cancer Neg Hx    Liver cancer Neg Hx     Social History Social History   Tobacco Use   Smoking status: Never    Passive exposure: Never   Smokeless tobacco: Never  Vaping Use   Vaping  status: Never Used  Substance Use Topics   Alcohol use: Yes    Alcohol/week: 0.0 standard drinks of alcohol    Comment: rare- once every 3 months   Drug use: No     Allergies   Sulfa antibiotics   Review of Systems Review of Systems   Physical Exam Triage Vital Signs ED Triage Vitals [03/12/23 1239]  Encounter Vitals Group     BP 134/83     Systolic BP Percentile      Diastolic BP Percentile      Pulse Rate 82     Resp 16     Temp 98.6 F (37 C)     Temp Source Oral     SpO2 97 %     Weight      Height      Head Circumference      Peak Flow      Pain Score 0     Pain Loc      Pain Education      Exclude from Growth Chart    No data found.  Updated Vital Signs BP 134/83 (BP Location: Right Arm)   Pulse 82   Temp 98.6 F (37 C) (Oral)   Resp 16   SpO2 97%   Visual Acuity Right Eye Distance:   Left Eye Distance:   Bilateral Distance:    Right Eye Near:   Left Eye Near:    Bilateral Near:     Physical Exam Vitals reviewed.  Constitutional:      General: She is not in acute distress.    Appearance: She is not toxic-appearing.  Skin:    Coloration: Skin is not pale.  Neurological:     Mental Status: She is alert and oriented to person, place, and time.  Psychiatric:        Behavior: Behavior normal.      UC Treatments / Results  Labs (all labs ordered are listed, but only abnormal results are displayed) Labs Reviewed  RPR  HIV ANTIBODY (ROUTINE TESTING W REFLEX)  CERVICOVAGINAL ANCILLARY ONLY    EKG   Radiology No results found.  Procedures Procedures (including critical care time)  Medications Ordered in UC Medications - No data to display  Initial Impression / Assessment and Plan / UC Course  I have reviewed the triage vital signs and the nursing notes.  Pertinent labs & imaging results that were available during my care of the patient were reviewed by me and considered in my medical decision making (see chart for  details).     Lab is drawn to check HIV and RPR.  Vaginal self swab is done, and we will notify of any positives on that or on the blood work, and treat per protocol.  Final Clinical Impressions(s) / UC Diagnoses   Final diagnoses:  Exposure to STD     Discharge Instructions      Staff will notify you if there is anything positive on the swab or bloodwork.     ED Prescriptions   None    PDMP not reviewed this encounter.   Zenia Resides, MD 03/12/23 [phone]

## 2023-03-12 NOTE — ED Triage Notes (Signed)
Patient is requesting STD testing and blood work.  Patient states she has a new partner and wants to make sure nothing present.

## 2023-03-12 NOTE — Discharge Instructions (Signed)
Staff will notify you if there is anything positive on the swab or blood work.

## 2023-03-13 LAB — CERVICOVAGINAL ANCILLARY ONLY
Chlamydia: NEGATIVE
Comment: NEGATIVE
Comment: NEGATIVE
Comment: NORMAL
Neisseria Gonorrhea: NEGATIVE
Trichomonas: NEGATIVE

## 2023-06-05 ENCOUNTER — Other Ambulatory Visit: Payer: Self-pay

## 2023-06-05 ENCOUNTER — Ambulatory Visit (HOSPITAL_COMMUNITY)
Admission: EM | Admit: 2023-06-05 | Discharge: 2023-06-05 | Disposition: A | Discharge status: Home / Self Care | Attending: Physician Assistant | Admitting: Physician Assistant

## 2023-06-05 ENCOUNTER — Encounter (HOSPITAL_COMMUNITY): Payer: Self-pay | Admitting: Emergency Medicine

## 2023-06-05 DIAGNOSIS — B379 Candidiasis, unspecified: Secondary | ICD-10-CM

## 2023-06-05 DIAGNOSIS — J0100 Acute maxillary sinusitis, unspecified: Secondary | ICD-10-CM

## 2023-06-05 DIAGNOSIS — T3695XA Adverse effect of unspecified systemic antibiotic, initial encounter: Secondary | ICD-10-CM | POA: Diagnosis not present

## 2023-06-05 MED ORDER — FLUCONAZOLE 150 MG PO TABS: 150.0000 mg | ORAL_TABLET | ORAL | 0 refills | Status: DC | PRN

## 2023-06-05 MED ORDER — AMOXICILLIN-POT CLAVULANATE 875-125 MG PO TABS: 1.0000 | ORAL_TABLET | Freq: Two times a day (BID) | ORAL | 0 refills | Status: DC

## 2023-06-05 NOTE — ED Triage Notes (Signed)
 Nasal congestion and sinus pressure for a week.

## 2023-06-05 NOTE — Discharge Instructions (Addendum)
 Based on your symptoms and duration of illness, I believe you may have a bacterial sinus infection  These typically resolve with antibiotic therapy along with at-home comfort measures  Today I have sent in a prescription for  Augmentin 875-125 mg to be taken by mouth twice per day for 7 days FINISH THE ENTIRE COURSE unless you develop an allergic reaction or are instructed to discontinue.  It can take a few days for the antibiotic to kick in so I recommend symptomatic relief with over the counter medication such as the following: Dayquil/ Nyquil Theraflu Alkaseltzer   If you have high blood pressure I recommend that you take Mucinex, Robitussin, Tylenol instead of the combination medications.  Combination medications typically have a decongestant in them that can cause your blood pressure to be high.  These medications typically have Tylenol in them already so you do not need to supplement with more outside of those medications  Stay well hydrated with at least 75 oz of water per day to help with recovery   I also recommend adding an antihistamine to your daily regimen This includes medications like Claritin, Allegra, Zyrtec- the generics of these work very well and are usually less expensive I recommend using Flonase nasal spray - 2 puffs twice per day to help with your nasal congestion The antihistamines and Flonase can take a few weeks to provide significant relief from allergy symptoms but should start to provide some benefit soon.   If at any point you start to develop swelling around the eyes and nose, trouble seeing, fevers that are not responding to medications, trouble breathing, passing out or headaches that are very severe please go to the emergency room for further evaluation management

## 2023-06-05 NOTE — ED Provider Notes (Signed)
 [facility]    CSN: [id number] Arrival date & time: 06/05/23  1822      History   Chief Complaint Chief Complaint  Patient presents with   Nasal Congestion    HPI Amanda Cox is a 48 y.o. female.   HPI   She reports she has been having productive cough, postnasal drip, nasal congestion She states this has been ongoing for about 4 weeks and did not improve with Zpack from her PCP  She was also provided with Promethazine-DM cough syrup but has only been able to use this at night due to sedation She has been using Mucinex and Flonase as well but this has not provided much relief She reports her grandmother was sick when she initially started to have symptoms    Past Medical History:  Diagnosis Date   Chronic insomnia 11/08/2016   Dyspareunia    Essential hypertension, benign    Genital herpes, unspecified    GERD (gastroesophageal reflux disease)    Headache syndrome 11/08/2016   Irregular menstrual cycle    Lumbar spondylosis    Meralgia paraesthetica, right 09/10/2020   Neuralgia and neuritis, unspecified    Obesity (BMI 30-39.9)    Surveillance of previously prescribed contraceptive pill     Patient Active Problem List   Diagnosis Date Noted   Pre-diabetes 02/09/2021   Meralgia paraesthetica, right 09/10/2020   Routine general medical examination at a health care facility 08/02/2018   Constipation 04/11/2018   Chronic insomnia 11/08/2016   Snoring 08/19/2013   BMI 35.0-35.9,adult 08/19/2013   Essential hypertension 07/10/2013   Genital herpes 07/17/2012    Past Surgical History:  Procedure Laterality Date   BREAST REDUCTION SURGERY  2005   CESAREAN SECTION  2003   REDUCTION MAMMAPLASTY      OB History     Gravida  2   Para  1   Term  1   Preterm      AB  1   Living  1      SAB      IAB  1   Ectopic      Multiple      Live Births  1            Home Medications    Prior to Admission medications    Medication Sig Start Date End Date Taking? Authorizing Provider  amoxicillin-clavulanate (AUGMENTIN) 875-125 MG tablet Take 1 tablet by mouth every 12 (twelve) hours. 06/05/23  Yes Aislinn Feliz E, PA-C  fluconazole (DIFLUCAN) 150 MG tablet Take 1 tablet (150 mg total) by mouth every three (3) days as needed. May repeat in 3 days if symptoms not resolved 06/05/23  Yes Reice Bienvenue E, PA-C  albuterol (PROVENTIL) (2.5 MG/3ML) 0.083% nebulizer solution Take 3 mLs (2.5 mg total) by nebulization every 6 (six) hours as needed. 02/17/22   Rising, Rebecca, PA-C  fluticasone (FLONASE) 50 MCG/ACT nasal spray Place 1 spray into both nostrils daily. 02/04/23   Garrison, [place] N, FNP  ipratropium (ATROVENT) 0.06 % nasal spray Place into both nostrils. 08/24/22   [provider]  metroNIDAZOLE (METROGEL) 0.75 % vaginal gel Place 1 Applicatorful vaginally 2 (two) times daily. For 5 days. 03/09/23   Brock Bad, MD  Misc. Devices KIT Nebulizer machine with tubing and mask. Reactive airway ICD cod J68.3 02/17/22   Rising, Lurena Joiner, PA-C  Multiple Vitamin (MULTIVITAMIN) tablet Take 1 tablet by mouth daily.    [provider]  Norgestimate-Ethinyl Estradiol Triphasic (TRI-LO-MILI)  0.18/0.215/0.25 MG-25 MCG tab Take 1 tablet by mouth daily. 07/11/22   Gabrielle Joiner, MD  olmesartan (BENICAR) 40 MG tablet Take 40 mg by mouth daily. 01/23/22   [provider]  Phentermine-Topiramate (QSYMIA PO) Take 1 tablet by mouth daily.    [provider]  torsemide (DEMADEX) 20 MG tablet Take by mouth. 01/23/22   [provider]  VALTREX 500 MG tablet Take 1 tablet (500 mg total) by mouth daily. 03/09/23   Gabrielle Joiner, MD    Family History Family History  Problem Relation Age of Onset   Hypertension Mother    Diabetes Mother    Hyperlipidemia Mother    Arthritis Mother    Hypertension Father    Diabetes Father    Arthritis Sister    Diabetes Sister    Arthritis Maternal  Grandmother    Arthritis Paternal Grandmother    Hypertension Paternal Grandmother    Congenital heart disease Other    Colon cancer Neg Hx    Rectal cancer Neg Hx    Esophageal cancer Neg Hx    Stomach cancer Neg Hx    Liver cancer Neg Hx     Social History Social History   Tobacco Use   Smoking status: Never    Passive exposure: Never   Smokeless tobacco: Never  Vaping Use   Vaping status: Never Used  Substance Use Topics   Alcohol use: Yes    Alcohol/week: 0.0 standard drinks of alcohol    Comment: rare- once every 3 months   Drug use: No     Allergies   Sulfa antibiotics   Review of Systems Review of Systems  Constitutional:  Positive for fatigue. Negative for chills and fever.  HENT:  Positive for congestion (at night), postnasal drip, rhinorrhea, sore throat (resolving) and voice change.   Respiratory:  Positive for cough. Negative for shortness of breath and wheezing.   Gastrointestinal:  Negative for diarrhea, nausea and vomiting.  Musculoskeletal:  Negative for myalgias.  Neurological:  Negative for dizziness, light-headedness and headaches.     Physical Exam Triage Vital Signs ED Triage Vitals  Encounter Vitals Group     BP 06/05/23 1920 (!) 147/82     Systolic BP Percentile --      Diastolic BP Percentile --      Pulse Rate 06/05/23 1919 89     Resp 06/05/23 1919 18     Temp 06/05/23 1919 98.2 F (36.8 C)     Temp Source 06/05/23 1919 Oral     SpO2 06/05/23 1919 100 %     Weight --      Height --      Head Circumference --      Peak Flow --      Pain Score 06/05/23 1919 3     Pain Loc --      Pain Education --      Exclude from Growth Chart --    No data found.  Updated Vital Signs BP (!) 147/82 (BP Location: Right Arm)   Pulse 89   Temp 98.2 F (36.8 C) (Oral)   Resp 18   LMP 05/22/2023 (Approximate)   SpO2 100%   Visual Acuity Right Eye Distance:   Left Eye Distance:   Bilateral Distance:    Right Eye Near:   Left Eye  Near:    Bilateral Near:     Physical Exam Vitals reviewed.  Constitutional:      General: She is awake.  Appearance: Normal appearance. She is well-developed and well-groomed.  HENT:     Head: Normocephalic and atraumatic.     Right Ear: Hearing, tympanic membrane and ear canal normal.     Left Ear: Hearing, tympanic membrane and ear canal normal.     Mouth/Throat:     Lips: Pink.     Mouth: Mucous membranes are moist.     Pharynx: Oropharynx is clear. Uvula midline. No pharyngeal swelling, oropharyngeal exudate, posterior oropharyngeal erythema, uvula swelling or postnasal drip.  Cardiovascular:     Rate and Rhythm: Normal rate and regular rhythm.     Pulses: Normal pulses.          Radial pulses are 2+ on the right side and 2+ on the left side.     Heart sounds: Normal heart sounds. No murmur heard.    No friction rub. No gallop.  Pulmonary:     Effort: Pulmonary effort is normal.     Breath sounds: Normal breath sounds. No decreased air movement. No decreased breath sounds, wheezing, rhonchi or rales.  Musculoskeletal:     Cervical back: Normal range of motion and neck supple.  Lymphadenopathy:     Head:     Right side of head: No submental, submandibular or preauricular adenopathy.     Left side of head: No submental, submandibular or preauricular adenopathy.     Cervical:     Right cervical: No superficial cervical adenopathy.    Left cervical: No superficial cervical adenopathy.     Upper Body:     Right upper body: No supraclavicular adenopathy.     Left upper body: No supraclavicular adenopathy.  Skin:    General: Skin is warm and dry.  Neurological:     General: No focal deficit present.     Mental Status: She is alert and oriented to person, place, and time.  Psychiatric:        Mood and Affect: Mood normal.        Behavior: Behavior normal. Behavior is cooperative.        Thought Content: Thought content normal.     UC Treatments / Results  Labs (all  labs ordered are listed, but only abnormal results are displayed) Labs Reviewed - No data to display  EKG   Radiology No results found.  Procedures Procedures (including critical care time)  Medications Ordered in UC Medications - No data to display  Initial Impression / Assessment and Plan / UC Course  I have reviewed the triage vital signs and the nursing notes.  Pertinent labs & imaging results that were available during my care of the patient were reviewed by me and considered in my medical decision making (see chart for details).      Final Clinical Impressions(s) / UC Diagnoses   Final diagnoses:  Acute maxillary sinusitis, recurrence not specified  Antibiotic-induced yeast infection   Patient presents today with concerns for sinus congestion, persistent productive cough, rhinorrhea for the past 4 weeks.  She reports that her symptoms did not improve with a Z-Pak provided by her PCP.  She reports some chills but is not sure if she has had a fever.  Physical exam is overall reassuring as is vitals.  Given chronicity of symptoms and lack of improvement with Z-Pak will send in Augmentin p.o. twice daily x 7 days.  Recommend continued use of over-the-counter medications as indicated for symptomatic management.  Recommend starting second-generation antihistamine as well as continued use of fluticasone nasal spray.  ED and return precautions reviewed and provided in after visit summary.  Follow-up as needed.    Discharge Instructions      Based on your symptoms and duration of illness, I believe you may have a bacterial sinus infection  These typically resolve with antibiotic therapy along with at-home comfort measures  Today I have sent in a prescription for  Augmentin 875-125 mg to be taken by mouth twice per day for 7 days FINISH THE ENTIRE COURSE unless you develop an allergic reaction or are instructed to discontinue.  It can take a few days for the antibiotic to kick in  so I recommend symptomatic relief with over the counter medication such as the following: Dayquil/ Nyquil Theraflu Alkaseltzer   If you have high blood pressure I recommend that you take Mucinex, Robitussin, Tylenol instead of the combination medications.  Combination medications typically have a decongestant in them that can cause your blood pressure to be high.  These medications typically have Tylenol in them already so you do not need to supplement with more outside of those medications  Stay well hydrated with at least 75 oz of water per day to help with recovery  I also recommend adding an antihistamine to your daily regimen This includes medications like Claritin, Allegra, Zyrtec- the generics of these work very well and are usually less expensive I recommend using Flonase nasal spray - 2 puffs twice per day to help with your nasal congestion The antihistamines and Flonase can take a few weeks to provide significant relief from allergy symptoms but should start to provide some benefit soon.   If at any point you start to develop swelling around the eyes and nose, trouble seeing, fevers that are not responding to medications, trouble breathing, passing out or headaches that are very severe please go to the emergency room for further evaluation management      ED Prescriptions     Medication Sig Dispense Auth. Provider   amoxicillin-clavulanate (AUGMENTIN) 875-125 MG tablet Take 1 tablet by mouth every 12 (twelve) hours. 14 tablet Rehana Uncapher E, PA-C   fluconazole (DIFLUCAN) 150 MG tablet Take 1 tablet (150 mg total) by mouth every three (3) days as needed. May repeat in 3 days if symptoms not resolved 2 tablet Samyah Bilbo E, PA-C      PDMP not reviewed this encounter.   Woodford Hayward 06/05/23 2011

## 2023-06-07 ENCOUNTER — Other Ambulatory Visit: Payer: Self-pay

## 2023-06-07 DIAGNOSIS — Z3041 Encounter for surveillance of contraceptive pills: Secondary | ICD-10-CM

## 2023-06-07 MED ORDER — NORGESTIM-ETH ESTRAD TRIPHASIC 0.18/0.215/0.25 MG-25 MCG PO TABS: 1.0000 | ORAL_TABLET | Freq: Every day | ORAL | 0 refills | Status: DC

## 2023-06-07 NOTE — Progress Notes (Signed)
 Refill for [facility] sent. Pt has AEX scheduled for May.

## 2023-07-07 ENCOUNTER — Other Ambulatory Visit: Payer: Self-pay | Admitting: Obstetrics and Gynecology

## 2023-07-07 DIAGNOSIS — Z3041 Encounter for surveillance of contraceptive pills: Secondary | ICD-10-CM

## 2023-07-11 ENCOUNTER — Other Ambulatory Visit (HOSPITAL_COMMUNITY)
Admission: RE | Admit: 2023-07-11 | Discharge: 2023-07-11 | Disposition: A | Discharge status: Home / Self Care | Source: Ambulatory Visit | Attending: Obstetrics | Admitting: Obstetrics

## 2023-07-11 ENCOUNTER — Ambulatory Visit: Admitting: Obstetrics

## 2023-07-11 ENCOUNTER — Encounter: Payer: Self-pay | Admitting: Obstetrics

## 2023-07-11 VITALS — BP 128/90 | HR 85 | Ht 63.0 in | Wt 224.1 lb

## 2023-07-11 DIAGNOSIS — Z3041 Encounter for surveillance of contraceptive pills: Secondary | ICD-10-CM

## 2023-07-11 DIAGNOSIS — R7303 Prediabetes: Secondary | ICD-10-CM

## 2023-07-11 DIAGNOSIS — N898 Other specified noninflammatory disorders of vagina: Secondary | ICD-10-CM | POA: Insufficient documentation

## 2023-07-11 DIAGNOSIS — B9689 Other specified bacterial agents as the cause of diseases classified elsewhere: Secondary | ICD-10-CM

## 2023-07-11 DIAGNOSIS — N946 Dysmenorrhea, unspecified: Secondary | ICD-10-CM | POA: Diagnosis not present

## 2023-07-11 DIAGNOSIS — Z01419 Encounter for gynecological examination (general) (routine) without abnormal findings: Secondary | ICD-10-CM | POA: Diagnosis present

## 2023-07-11 DIAGNOSIS — Z113 Encounter for screening for infections with a predominantly sexual mode of transmission: Secondary | ICD-10-CM

## 2023-07-11 DIAGNOSIS — A6000 Herpesviral infection of urogenital system, unspecified: Secondary | ICD-10-CM

## 2023-07-11 DIAGNOSIS — I10 Essential (primary) hypertension: Secondary | ICD-10-CM

## 2023-07-11 DIAGNOSIS — N760 Acute vaginitis: Secondary | ICD-10-CM | POA: Diagnosis not present

## 2023-07-11 MED ORDER — NORGESTIM-ETH ESTRAD TRIPHASIC 0.18/0.215/0.25 MG-25 MCG PO TABS: 1.0000 | ORAL_TABLET | Freq: Every day | ORAL | 11 refills | Status: DC

## 2023-07-11 MED ORDER — FLUCONAZOLE 200 MG PO TABS
200.0000 mg | ORAL_TABLET | ORAL | 2 refills | Status: DC
Start: 2023-07-11 — End: 2023-09-10

## 2023-07-11 MED ORDER — IBUPROFEN 800 MG PO TABS: 800.0000 mg | ORAL_TABLET | Freq: Three times a day (TID) | ORAL | 5 refills | Status: AC | PRN

## 2023-07-11 MED ORDER — METRONIDAZOLE 0.75 % VA GEL: 1.0000 | Freq: Two times a day (BID) | VAGINAL | 5 refills | Status: DC

## 2023-07-11 MED ORDER — VALTREX 500 MG PO TABS: 500.0000 mg | ORAL_TABLET | Freq: Every day | ORAL | 11 refills | Status: DC

## 2023-07-11 NOTE — Progress Notes (Signed)
 Subjective:        Amanda Cox is a 48 y.o. female here for a routine exam.  Current complaints: Vaginal discharge.    Personal health questionnaire:  Is patient [name], have a family history of breast and/or ovarian cancer: no Is there a family history of uterine cancer diagnosed at age < 42, gastrointestinal cancer, urinary tract cancer, family member who is a Personnel officer syndrome-associated carrier: yes Is the patient overweight and hypertensive, family history of diabetes, personal history of gestational diabetes, preeclampsia or PCOS: yes Is patient over 51, have PCOS,  family history of premature CHD under age 51, diabetes, smoke, have hypertension or peripheral artery disease:  no At any time, has a partner hit, kicked or otherwise hurt or frightened you?: no Over the past 2 weeks, have you felt down, depressed or hopeless?: no Over the past 2 weeks, have you felt little interest or pleasure in doing things?:no   Gynecologic History Patient's last menstrual period was 06/14/2023 (approximate). Contraception: OCP (estrogen/progesterone) Last Pap: 07/11/2022. Results were: normal Last mammogram: 11/23/2022. Results were: normal  Obstetric History OB History  Gravida Para Term Preterm AB Living  2 1 1  1 1   SAB IAB Ectopic Multiple Live Births   1   1    # Outcome Date GA Lbr Len/2nd Weight Sex Type Anes PTL Lv  2 Term 07/22/01 [redacted]w[redacted]d  7 lb (3.175 kg) F CS-LTranv EPI N LIV  1 IAB             Past Medical History:  Diagnosis Date   Chronic insomnia 11/08/2016   Dyspareunia    Essential hypertension, benign    Genital herpes, unspecified    GERD (gastroesophageal reflux disease)    Headache syndrome 11/08/2016   Irregular menstrual cycle    Lumbar spondylosis    Meralgia paraesthetica, right 09/10/2020   Neuralgia and neuritis, unspecified    Obesity (BMI 30-39.9)    Surveillance of previously prescribed contraceptive pill     Past Surgical History:   Procedure Laterality Date   BREAST REDUCTION SURGERY  2005   CESAREAN SECTION  2003   REDUCTION MAMMAPLASTY       Current Outpatient Medications:    fluconazole  (DIFLUCAN ) 150 MG tablet, Take 1 tablet (150 mg total) by mouth every three (3) days as needed. May repeat in 3 days if symptoms not resolved, Disp: 2 tablet, Rfl: 0   fluconazole  (DIFLUCAN ) 200 MG tablet, Take 1 tablet (200 mg total) by mouth every 3 (three) days., Disp: 3 tablet, Rfl: 2   fluticasone  (FLONASE ) 50 MCG/ACT nasal spray, Place 1 spray into both nostrils daily., Disp: 9.9 mL, Rfl: 2   ibuprofen  (ADVIL ) 800 MG tablet, Take 1 tablet (800 mg total) by mouth every 8 (eight) hours as needed., Disp: 30 tablet, Rfl: 5   Misc. Devices KIT, Nebulizer machine with tubing and mask. Reactive airway ICD cod J68.3, Disp: 1 kit, Rfl: 0   Multiple Vitamin (MULTIVITAMIN) tablet, Take 1 tablet by mouth daily., Disp: , Rfl:    olmesartan (BENICAR) 40 MG tablet, Take 40 mg by mouth daily., Disp: , Rfl:    torsemide (DEMADEX) 20 MG tablet, Take by mouth., Disp: , Rfl:    albuterol  (PROVENTIL ) (2.5 MG/3ML) 0.083% nebulizer solution, Take 3 mLs (2.5 mg total) by nebulization every 6 (six) hours as needed. (Patient not taking: Reported on 07/11/2023), Disp: 75 mL, Rfl: 2   amoxicillin -clavulanate (AUGMENTIN ) 875-125 MG tablet, Take 1 tablet by mouth  every 12 (twelve) hours. (Patient not taking: Reported on 07/11/2023), Disp: 14 tablet, Rfl: 0   ipratropium (ATROVENT ) 0.06 % nasal spray, Place into both nostrils., Disp: , Rfl:    metroNIDAZOLE  (METROGEL ) 0.75 % vaginal gel, Place 1 Applicatorful vaginally 2 (two) times daily. For 5 days., Disp: 100 g, Rfl: 5   Norgestim-Eth Estrad Triphasic (NORGESTIMATE -ETHINYL ESTRADIOL  TRIPHASIC) 0.18/0.215/0.25 MG-25 MCG tab, Take 1 tablet by mouth daily., Disp: 84 tablet, Rfl: 11   VALTREX  500 MG tablet, Take 1 tablet (500 mg total) by mouth daily., Disp: 30 tablet, Rfl: 11 Allergies  Allergen Reactions    Sulfa Antibiotics Shortness Of Breath    Social History   Tobacco Use   Smoking status: Never    Passive exposure: Never   Smokeless tobacco: Never  Substance Use Topics   Alcohol use: Yes    Alcohol/week: 0.0 standard drinks of alcohol    Comment: rare- once every 3 months    Family History  Problem Relation Age of Onset   Hypertension Mother    Diabetes Mother    Hyperlipidemia Mother    Arthritis Mother    Hypertension Father    Diabetes Father    Arthritis Sister    Diabetes Sister    Arthritis Maternal Grandmother    Arthritis Paternal Grandmother    Hypertension Paternal Grandmother    Congenital heart disease Other    Colon cancer Neg Hx    Rectal cancer Neg Hx    Esophageal cancer Neg Hx    Stomach cancer Neg Hx    Liver cancer Neg Hx       Review of Systems  Constitutional: negative for fatigue and weight loss Respiratory: negative for cough and wheezing Cardiovascular: negative for chest pain, fatigue and palpitations Gastrointestinal: negative for abdominal pain and change in bowel habits Musculoskeletal:negative for myalgias Neurological: negative for gait problems and tremors Behavioral/Psych: negative for abusive relationship, depression Endocrine: negative for temperature intolerance    Genitourinary: positive for vaginal discharge.  negative for abnormal menstrual periods, genital lesions, hot flashes, sexual problems  Integument/breast: negative for breast lump, breast tenderness, nipple discharge and skin lesion(s)    Objective:       BP (!) 128/90   Pulse 85   Ht 5\' 3"  (1.6 m)   Wt 224 lb 1.6 oz (101.7 kg)   LMP 06/14/2023 (Approximate)   BMI 39.70 kg/m  General:   Alert and no distress  Skin:   no rash or abnormalities  Lungs:   clear to auscultation bilaterally  Heart:   regular rate and rhythm, S1, S2 normal, no murmur, click, rub or gallop  Breasts:   normal without suspicious masses, skin or nipple changes or axillary nodes   Abdomen:  normal findings: no organomegaly, soft, non-tender and no hernia  Pelvis:  External genitalia: normal general appearance Urinary system: urethral meatus normal and bladder without fullness, nontender Vaginal: normal without tenderness, induration or masses Cervix: normal appearance Adnexa: normal bimanual exam Uterus: anteverted and non-tender, normal size   Lab Review Urine pregnancy test Labs reviewed yes Radiologic studies reviewed yes  I have spent a total of 20 minutes of face-to-face time, excluding clinical staff time, reviewing notes and preparing to see patient, ordering tests and/or medications, and counseling the patient.  Assessment:    1. Encounter for gynecological examination with Papanicolaou smear of cervix (Primary) Rx: - Cytology - PAP( [facility])  2. Dysmenorrhea Rx: - ibuprofen  (ADVIL ) 800 MG tablet; Take 1 tablet (800 mg  total) by mouth every 8 (eight) hours as needed.  Dispense: 30 tablet; Refill: 5  3. Vaginal discharge Rx: - Cervicovaginal ancillary only( [place]) - fluconazole  (DIFLUCAN ) 200 MG tablet; Take 1 tablet (200 mg total) by mouth every 3 (three) days.  Dispense: 3 tablet; Refill: 2  4. BV (bacterial vaginosis) Rx: - metroNIDAZOLE  (METROGEL ) 0.75 % vaginal gel; Place 1 Applicatorful vaginally 2 (two) times daily. For 5 days.  Dispense: 100 g; Refill: 5  5. Genital herpes simplex, unspecified site Rx: - VALTREX  500 MG tablet; Take 1 tablet (500 mg total) by mouth daily.  Dispense: 30 tablet; Refill: 11  6. Screening for STD (sexually transmitted disease) Rx: - HIV antibody (with reflex) - RPR - Hepatitis B Surface AntiGEN - Hepatitis C Antibody  7. Encounter for surveillance of contraceptive pills Rx: - Norgestim-Eth Estrad Triphasic (NORGESTIMATE -ETHINYL ESTRADIOL  TRIPHASIC) 0.18/0.215/0.25 MG-25 MCG tab; Take 1 tablet by mouth daily.  Dispense: 84 tablet; Refill: 11  8. HTN (hypertension), benign - managed by PCP,  clinically stable  9. Prediabetes - managed by PCP     Plan:    Education reviewed: calcium  supplements, depression evaluation, low fat, low cholesterol diet, safe sex/STD prevention, self breast exams, and weight bearing exercise. Contraception: OCP (estrogen/progesterone). Follow up in: 1 year.   Meds ordered this encounter  Medications   Norgestim-Eth Estrad Triphasic (NORGESTIMATE -ETHINYL ESTRADIOL  TRIPHASIC) 0.18/0.215/0.25 MG-25 MCG tab    Sig: Take 1 tablet by mouth daily.    Dispense:  84 tablet    Refill:  11   VALTREX  500 MG tablet    Sig: Take 1 tablet (500 mg total) by mouth daily.    Dispense:  30 tablet    Refill:  11   ibuprofen  (ADVIL ) 800 MG tablet    Sig: Take 1 tablet (800 mg total) by mouth every 8 (eight) hours as needed.    Dispense:  30 tablet    Refill:  5   metroNIDAZOLE  (METROGEL ) 0.75 % vaginal gel    Sig: Place 1 Applicatorful vaginally 2 (two) times daily. For 5 days.    Dispense:  100 g    Refill:  5   fluconazole  (DIFLUCAN ) 200 MG tablet    Sig: Take 1 tablet (200 mg total) by mouth every 3 (three) days.    Dispense:  3 tablet    Refill:  2   Orders Placed This Encounter  Procedures   HIV antibody (with reflex)   RPR   Hepatitis B Surface AntiGEN   Hepatitis C Antibody    Gabrielle Joiner, MD, FACOG Attending Obstetrician & Gynecologist, [facility] for [facility], [facility], [place] 07/11/2023

## 2023-07-11 NOTE — Progress Notes (Signed)
 Pt presents for annual. Pt wants all std testing. No questions or concerns at this at this time.

## 2023-07-12 ENCOUNTER — Other Ambulatory Visit: Payer: Self-pay

## 2023-07-12 DIAGNOSIS — Z3041 Encounter for surveillance of contraceptive pills: Secondary | ICD-10-CM

## 2023-07-12 LAB — HEPATITIS C ANTIBODY: Hep C Virus Ab: NONREACTIVE

## 2023-07-12 LAB — CERVICOVAGINAL ANCILLARY ONLY
Bacterial Vaginitis (gardnerella): NEGATIVE
Candida Glabrata: NEGATIVE
Candida Vaginitis: NEGATIVE
Chlamydia: NEGATIVE
Comment: NEGATIVE
Comment: NEGATIVE
Comment: NEGATIVE
Comment: NEGATIVE
Comment: NEGATIVE
Comment: NORMAL
Neisseria Gonorrhea: NEGATIVE
Trichomonas: NEGATIVE

## 2023-07-12 LAB — RPR: RPR Ser Ql: NONREACTIVE

## 2023-07-12 LAB — HEPATITIS B SURFACE ANTIGEN: Hepatitis B Surface Ag: NEGATIVE

## 2023-07-12 LAB — HIV ANTIBODY (ROUTINE TESTING W REFLEX): HIV Screen 4th Generation wRfx: NONREACTIVE

## 2023-07-12 MED ORDER — NORGESTIM-ETH ESTRAD TRIPHASIC 0.18/0.215/0.25 MG-25 MCG PO TABS: 1.0000 | ORAL_TABLET | Freq: Every day | ORAL | 11 refills | Status: AC

## 2023-07-18 ENCOUNTER — Ambulatory Visit: Payer: Self-pay | Admitting: Family Medicine

## 2023-07-18 LAB — CYTOLOGY - PAP
Adequacy: ABSENT
Comment: NEGATIVE
Diagnosis: NEGATIVE
High risk HPV: NEGATIVE

## 2023-08-27 ENCOUNTER — Other Ambulatory Visit: Payer: Self-pay | Admitting: Internal Medicine

## 2023-08-27 DIAGNOSIS — Z1231 Encounter for screening mammogram for malignant neoplasm of breast: Secondary | ICD-10-CM

## 2023-09-03 ENCOUNTER — Ambulatory Visit (HOSPITAL_COMMUNITY): Admit: 2023-09-03 | Discharge: 2023-09-03 | Discharge status: Against Medical Advice

## 2023-09-03 NOTE — ED Notes (Signed)
 Pt asked if she had to feel out the [employer]. This RN confirmed with other staff members that is you are being seen for a dog bite they need you to fill out the information regarding the dog. Pt states. There is barely anything there. My tetanus is up to date but I read on the internet if there was any bleeding then needed to be checked. I really don't want to fill this paper out. Pt states that she doesn't want to be seen. Pt did Report the dog's vaccinations are up to date.

## 2023-09-10 ENCOUNTER — Encounter (HOSPITAL_COMMUNITY): Payer: Self-pay | Admitting: *Deleted

## 2023-09-10 ENCOUNTER — Ambulatory Visit (HOSPITAL_COMMUNITY)
Admission: EM | Admit: 2023-09-10 | Discharge: 2023-09-10 | Disposition: A | Discharge status: Home / Self Care | Attending: Family Medicine | Admitting: Family Medicine

## 2023-09-10 ENCOUNTER — Other Ambulatory Visit: Payer: Self-pay

## 2023-09-10 DIAGNOSIS — R42 Dizziness and giddiness: Secondary | ICD-10-CM | POA: Insufficient documentation

## 2023-09-10 LAB — BASIC METABOLIC PANEL WITH GFR
Anion gap: 12 (ref 5–15)
BUN: 13 mg/dL (ref 6–20)
CO2: 23 mmol/L (ref 22–32)
Calcium: 9.2 mg/dL (ref 8.9–10.3)
Chloride: 104 mmol/L (ref 98–111)
Creatinine, Ser: 0.69 mg/dL (ref 0.44–1.00)
GFR, Estimated: >60 mL/min (ref >60)
Glucose, Bld: 88 mg/dL (ref 70–99)
Potassium: 4.2 mmol/L (ref 3.5–5.1)
Sodium: 139 mmol/L (ref 135–145)

## 2023-09-10 LAB — CBC
HCT: 41.7 % (ref 36.0–46.0)
Hemoglobin: 13.2 g/dL (ref 12.0–15.0)
MCH: 27.7 pg (ref 26.0–34.0)
MCHC: 31.7 g/dL (ref 30.0–36.0)
MCV: 87.6 fL (ref 80.0–100.0)
Platelets: 386 K/uL (ref 150–400)
RBC: 4.76 MIL/uL (ref 3.87–5.11)
RDW: 12.9 % (ref 11.5–15.5)
WBC: 10.1 K/uL (ref 4.0–10.5)
nRBC: 0 % (ref 0.0–0.2)

## 2023-09-10 LAB — POCT FASTING CBG KUC MANUAL ENTRY: POCT Glucose (KUC): 89 mg/dL (ref 70–99)

## 2023-09-10 LAB — TSH: TSH: 0.585 u[IU]/mL (ref 0.350–4.500)

## 2023-09-10 NOTE — Discharge Instructions (Signed)
 Your sugar about 3 hours after eating was 89  Your vital signs did not change significantly to show that you are dehydrated.  We have drawn blood to check your blood counts and your electrolytes and thyroid  function.  Our staff will notify you if anything is significantly abnormal, and your results will also go directly to your MyChart.  If you feel worse in any way, please go to the emergency room  Please follow-up with your primary care

## 2023-09-10 NOTE — ED Provider Notes (Signed)
 [facility]    CSN: [id number] Arrival date & time: 09/10/23  1559      History   Chief Complaint Chief Complaint  Patient presents with   Dizziness    HPI Amanda Cox is a 48 y.o. female.    Dizziness  Here for lightheaded dizziness that has been going on since yesterday.  Not really any headache.  No fever or chills and no recent nausea or vomiting or diarrhea.  No upper respiratory symptoms or cough.  No dysuria or abdominal pain  She is allergic to sulfa  She does have prediabetes and has been checking her sugar.  It is ranging 99-1 21 when she is fasting.  She last had anything to eat about 4 hours ago.  She also has been checking her blood pressure and the diastolic blood pressure has been varying from in the 80s up to 104.  She does take medication for her blood pressure  Past Medical History:  Diagnosis Date   Chronic insomnia 11/08/2016   Dyspareunia    Essential hypertension, benign    Genital herpes, unspecified    GERD (gastroesophageal reflux disease)    Headache syndrome 11/08/2016   Irregular menstrual cycle    Lumbar spondylosis    Meralgia paraesthetica, right 09/10/2020   Neuralgia and neuritis, unspecified    Obesity (BMI 30-39.9)    Surveillance of previously prescribed contraceptive pill     Patient Active Problem List   Diagnosis Date Noted   Pre-diabetes 02/09/2021   Meralgia paraesthetica, right 09/10/2020   Routine general medical examination at a health care facility 08/02/2018   Constipation 04/11/2018   Chronic insomnia 11/08/2016   Snoring 08/19/2013   BMI 35.0-35.9,adult 08/19/2013   Essential hypertension 07/10/2013   Genital herpes 07/17/2012    Past Surgical History:  Procedure Laterality Date   BREAST REDUCTION SURGERY  2005   CESAREAN SECTION  2003   REDUCTION MAMMAPLASTY      OB History     Gravida  2   Para  1   Term  1   Preterm      AB  1   Living  1      SAB      IAB  1    Ectopic      Multiple      Live Births  1            Home Medications    Prior to Admission medications   Medication Sig Start Date End Date Taking? Authorizing Provider  ibuprofen  (ADVIL ) 800 MG tablet Take 1 tablet (800 mg total) by mouth every 8 (eight) hours as needed. 07/11/23  Yes Rudy Carlin LABOR, MD  Multiple Vitamin (MULTIVITAMIN) tablet Take 1 tablet by mouth daily.   Yes [provider]  Jaquita Kale Triphasic (NORGESTIMATE -ETHINYL ESTRADIOL  TRIPHASIC) 0.18/0.215/0.25 MG-25 MCG tab Take 1 tablet by mouth daily. 07/12/23  Yes Rudy Carlin LABOR, MD  olmesartan (BENICAR) 40 MG tablet Take 40 mg by mouth daily. 01/23/22  Yes [provider]  torsemide (DEMADEX) 20 MG tablet Take by mouth. 01/23/22  Yes [provider]  VALTREX  500 MG tablet Take 1 tablet (500 mg total) by mouth daily. 07/11/23  Yes Rudy Carlin LABOR, MD  albuterol  (PROVENTIL ) (2.5 MG/3ML) 0.083% nebulizer solution Take 3 mLs (2.5 mg total) by nebulization every 6 (six) hours as needed. Patient not taking: Reported on 07/11/2023 02/17/22   Rising, Asberry, PA-C  fluticasone  (FLONASE ) 50 MCG/ACT nasal spray Place  1 spray into both nostrils daily. 02/04/23   Dreama, [place]  LOISE, FNP  Misc. Devices KIT Nebulizer machine with tubing and mask. Reactive airway ICD cod J68.3 02/17/22   Rising, Asberry, PA-C    Family History Family History  Problem Relation Age of Onset   Hypertension Mother    Diabetes Mother    Hyperlipidemia Mother    Arthritis Mother    Hypertension Father    Diabetes Father    Arthritis Sister    Diabetes Sister    Arthritis Maternal Grandmother    Arthritis Paternal Grandmother    Hypertension Paternal Grandmother    Congenital heart disease Other    Colon cancer Neg Hx    Rectal cancer Neg Hx    Esophageal cancer Neg Hx    Stomach cancer Neg Hx    Liver cancer Neg Hx     Social History Social History   Tobacco Use   Smoking status: Never     Passive exposure: Never   Smokeless tobacco: Never  Vaping Use   Vaping status: Never Used  Substance Use Topics   Alcohol use: Yes    Alcohol/week: 0.0 standard drinks of alcohol    Comment: rare- once every 3 months   Drug use: No     Allergies   Sulfa antibiotics   Review of Systems Review of Systems  Neurological:  Positive for dizziness.     Physical Exam Triage Vital Signs ED Triage Vitals  Encounter Vitals Group     BP 09/10/23 1656 133/89     Girls Systolic BP Percentile --      Girls Diastolic BP Percentile --      Boys Systolic BP Percentile --      Boys Diastolic BP Percentile --      Pulse Rate 09/10/23 1656 73     Resp 09/10/23 1656 18     Temp 09/10/23 1656 98.9 F (37.2 C)     Temp src --      SpO2 09/10/23 1656 98 %     Weight --      Height --      Head Circumference --      Peak Flow --      Pain Score 09/10/23 1657 0     Pain Loc --      Pain Education --      Exclude from Growth Chart --    Orthostatic VS for the past 24 hrs:  BP- Lying Pulse- Lying BP- Sitting Pulse- Sitting BP- Standing at 0 minutes Pulse- Standing at 0 minutes  09/10/23 1753 142/87 68 129/86 77 124/86 81    Updated Vital Signs BP 133/89   Pulse 73   Temp 98.9 F (37.2 C)   Resp 18   LMP 09/06/2023   SpO2 98%   Visual Acuity Right Eye Distance:   Left Eye Distance:   Bilateral Distance:    Right Eye Near:   Left Eye Near:    Bilateral Near:     Physical Exam Vitals reviewed.  Constitutional:      General: She is not in acute distress.    Appearance: She is not toxic-appearing.  HENT:     Right Ear: Tympanic membrane and ear canal normal.     Left Ear: Tympanic membrane and ear canal normal.     Nose: Nose normal.     Mouth/Throat:     Mouth: Mucous membranes are moist.     Pharynx: No oropharyngeal exudate or posterior  oropharyngeal erythema.  Eyes:     Extraocular Movements: Extraocular movements intact.     Conjunctiva/sclera: Conjunctivae  normal.     Pupils: Pupils are equal, round, and reactive to light.  Cardiovascular:     Rate and Rhythm: Normal rate and regular rhythm.     Heart sounds: No murmur heard. Pulmonary:     Effort: Pulmonary effort is normal. No respiratory distress.     Breath sounds: No stridor. No wheezing, rhonchi or rales.  Musculoskeletal:     Cervical back: Neck supple.  Lymphadenopathy:     Cervical: No cervical adenopathy.  Skin:    Capillary Refill: Capillary refill takes less than 2 seconds.     Coloration: Skin is not jaundiced or pale.  Neurological:     General: No focal deficit present.     Mental Status: She is alert and oriented to person, place, and time.  Psychiatric:        Behavior: Behavior normal.      UC Treatments / Results  Labs (all labs ordered are listed, but only abnormal results are displayed) Labs Reviewed  BASIC METABOLIC PANEL WITH GFR  CBC  TSH  POCT FASTING CBG [facility] MANUAL ENTRY    EKG   Radiology No results found.  Procedures Procedures (including critical care time)  Medications Ordered in UC Medications - No data to display  Initial Impression / Assessment and Plan / UC Course  I have reviewed the triage vital signs and the nursing notes.  Pertinent labs & imaging results that were available during my care of the patient were reviewed by me and considered in my medical decision making (see chart for details).     Fingerstick glucose is 89.  Vital signs do not show orthostasis.  CBC and BMP are drawn today.  She will make sure she is drinking plenty of fluids.  She will follow-up with primary care, and will go to the emergency room if she feels any worse. Final Clinical Impressions(s) / UC Diagnoses   Final diagnoses:  Dizziness     Discharge Instructions      Your sugar about 3 hours after eating was 89  Your vital signs did not change significantly to show that you are dehydrated.  We have drawn blood to check your blood  counts and your electrolytes and thyroid  function.  Our staff will notify you if anything is significantly abnormal, and your results will also go directly to your MyChart.  If you feel worse in any way, please go to the emergency room  Please follow-up with your primary care     ED Prescriptions   None    PDMP not reviewed this encounter.   Vonna Sharlet POUR, MD 09/10/23 [phone]

## 2023-09-10 NOTE — ED Triage Notes (Signed)
 PT reports feeling dizzy and light headed

## 2023-09-11 ENCOUNTER — Ambulatory Visit (HOSPITAL_COMMUNITY): Payer: Self-pay

## 2023-09-14 ENCOUNTER — Other Ambulatory Visit: Payer: Self-pay

## 2023-09-14 ENCOUNTER — Emergency Department (HOSPITAL_COMMUNITY)
Admission: EM | Admit: 2023-09-14 | Discharge: 2023-09-14 | Disposition: A | Discharge status: Home / Self Care | Attending: Emergency Medicine | Admitting: Emergency Medicine

## 2023-09-14 DIAGNOSIS — R42 Dizziness and giddiness: Secondary | ICD-10-CM | POA: Diagnosis present

## 2023-09-14 LAB — CBC WITH DIFFERENTIAL/PLATELET
Abs Immature Granulocytes: 0.02 K/uL (ref 0.00–0.07)
Basophils Absolute: 0.1 K/uL (ref 0.0–0.1)
Basophils Relative: 1 %
Eosinophils Absolute: 0.1 K/uL (ref 0.0–0.5)
Eosinophils Relative: 1 %
HCT: 40.8 % (ref 36.0–46.0)
Hemoglobin: 12.4 g/dL (ref 12.0–15.0)
Immature Granulocytes: 0 %
Lymphocytes Relative: 29 %
Lymphs Abs: 2.4 K/uL (ref 0.7–4.0)
MCH: 27.8 pg (ref 26.0–34.0)
MCHC: 30.4 g/dL (ref 30.0–36.0)
MCV: 91.5 fL (ref 80.0–100.0)
Monocytes Absolute: 0.9 K/uL (ref 0.1–1.0)
Monocytes Relative: 11 %
Neutro Abs: 4.8 K/uL (ref 1.7–7.7)
Neutrophils Relative %: 58 %
Platelets: 330 K/uL (ref 150–400)
RBC: 4.46 MIL/uL (ref 3.87–5.11)
RDW: 12.9 % (ref 11.5–15.5)
WBC: 8.3 K/uL (ref 4.0–10.5)
nRBC: 0 % (ref 0.0–0.2)

## 2023-09-14 LAB — URINALYSIS, ROUTINE W REFLEX MICROSCOPIC
Bacteria, UA: NONE SEEN
Bilirubin Urine: NEGATIVE
Glucose, UA: NEGATIVE mg/dL
Ketones, ur: NEGATIVE mg/dL
Leukocytes,Ua: NEGATIVE
Nitrite: NEGATIVE
Protein, ur: NEGATIVE mg/dL
Specific Gravity, Urine: 1.02 (ref 1.005–1.030)
pH: 6 (ref 5.0–8.0)

## 2023-09-14 LAB — BASIC METABOLIC PANEL WITH GFR
Anion gap: 10 (ref 5–15)
BUN: 15 mg/dL (ref 6–20)
CO2: 25 mmol/L (ref 22–32)
Calcium: 9.2 mg/dL (ref 8.9–10.3)
Chloride: 102 mmol/L (ref 98–111)
Creatinine, Ser: 0.69 mg/dL (ref 0.44–1.00)
GFR, Estimated: >60 mL/min (ref >60)
Glucose, Bld: 119 mg/dL — ABNORMAL HIGH (ref 70–99)
Potassium: 4.6 mmol/L (ref 3.5–5.1)
Sodium: 137 mmol/L (ref 135–145)

## 2023-09-14 LAB — PREGNANCY, URINE: Preg Test, Ur: NEGATIVE

## 2023-09-14 MED ORDER — MECLIZINE HCL 25 MG PO TABS: 25.0000 mg | ORAL_TABLET | Freq: Three times a day (TID) | ORAL | 0 refills | Status: DC | PRN

## 2023-09-14 NOTE — ED Triage Notes (Signed)
 Patient reports feeling lightheaded/dizzy onset last week with mild headache , she adds cousin's dog grazed her right index finger 2 weeks ago , dog's immunizations are up to date .

## 2023-09-14 NOTE — ED Provider Notes (Signed)
 [place] EMERGENCY DEPARTMENT AT [facility] Provider Note   CSN: [id number] Arrival date & time: 09/14/23  9771     Patient presents with: Lightheaded   Amanda Cox is a 48 y.o. female.   Patient to ED with complaint of dizziness x 5 days. No fever, nausea, vomiting or headache. She describes head pressure. No visual change. The dizziness is described as surroundings spinning. It is unchanged with position changes. She was bitten by a dog with minimal wound around 7/12 and current symptoms started 5 days ago. The dog was confirmed up to date on rabies vaccination. She was concerned her symptoms were stemming from this contact.   The history is provided by the patient. No language interpreter was used.       Prior to Admission medications   Medication Sig Start Date End Date Taking? Authorizing Provider  meclizine (ANTIVERT) 25 MG tablet Take 1 tablet (25 mg total) by mouth 3 (three) times daily as needed for dizziness. 09/14/23  Yes Odell Balls, PA-C  albuterol  (PROVENTIL ) (2.5 MG/3ML) 0.083% nebulizer solution Take 3 mLs (2.5 mg total) by nebulization every 6 (six) hours as needed. Patient not taking: Reported on 07/11/2023 02/17/22   Rising, Asberry, PA-C  fluticasone  (FLONASE ) 50 MCG/ACT nasal spray Place 1 spray into both nostrils daily. 02/04/23   Dreama, [place]  N, FNP  ibuprofen  (ADVIL ) 800 MG tablet Take 1 tablet (800 mg total) by mouth every 8 (eight) hours as needed. 07/11/23   Rudy Carlin LABOR, MD  Misc. Devices KIT Nebulizer machine with tubing and mask. Reactive airway ICD cod J68.3 02/17/22   Rising, Asberry, PA-C  Multiple Vitamin (MULTIVITAMIN) tablet Take 1 tablet by mouth daily.    [provider]  Jaquita Kale Triphasic (NORGESTIMATE -ETHINYL ESTRADIOL  TRIPHASIC) 0.18/0.215/0.25 MG-25 MCG tab Take 1 tablet by mouth daily. 07/12/23   Rudy Carlin LABOR, MD  olmesartan (BENICAR) 40 MG tablet Take 40 mg by mouth daily. 01/23/22    [provider]  torsemide (DEMADEX) 20 MG tablet Take by mouth. 01/23/22   [provider]  VALTREX  500 MG tablet Take 1 tablet (500 mg total) by mouth daily. 07/11/23   Rudy Carlin LABOR, MD    Allergies: Sulfa antibiotics    Review of Systems  Updated Vital Signs BP 132/87   Pulse 69   Temp 98.2 F (36.8 C) (Oral)   Resp 13   LMP 09/06/2023   SpO2 100%   Physical Exam Vitals and nursing note reviewed.  Constitutional:      Appearance: She is well-developed.  HENT:     Head: Normocephalic.  Cardiovascular:     Rate and Rhythm: Normal rate and regular rhythm.     Heart sounds: No murmur heard. Pulmonary:     Effort: Pulmonary effort is normal.     Breath sounds: Normal breath sounds. No wheezing, rhonchi or rales.  Abdominal:     General: Bowel sounds are normal.     Palpations: Abdomen is soft.     Tenderness: There is no abdominal tenderness. There is no guarding or rebound.  Musculoskeletal:        General: Normal range of motion.     Cervical back: Normal range of motion and neck supple.  Skin:    General: Skin is warm and dry.  Neurological:     General: No focal deficit present.     Mental Status: She is alert and oriented to person, place, and time.     GCS: GCS  eye subscore is 4. GCS verbal subscore is 5. GCS motor subscore is 6.     Cranial Nerves: No cranial nerve deficit or facial asymmetry.     Sensory: Sensation is intact.     Motor: No weakness or pronator drift.     Coordination: Finger-Nose-Finger Test normal.     Gait: Gait normal.     (all labs ordered are listed, but only abnormal results are displayed) Labs Reviewed  URINALYSIS, ROUTINE W REFLEX MICROSCOPIC - Abnormal; Notable for the following components:      Result Value   Hgb urine dipstick SMALL (*)    All other components within normal limits  BASIC METABOLIC PANEL WITH GFR - Abnormal; Notable for the following components:   Glucose, Bld 119 (*)    All other  components within normal limits  CBC WITH DIFFERENTIAL/PLATELET  PREGNANCY, URINE    EKG: None  Radiology: No results found.   Procedures   Medications Ordered in the ED - No data to display  Clinical Course as of 09/14/23 0625  Fri Sep 14, 2023  9540 Patient to ED for evaluation of 5 days of feeling room-spinning type dizziness, unrelated to position. No vomiting/nausea, no visual change. No ss/sxs of illness. She denies change of pregnancy.  [SU]  0623 VSS. Labs unremarkable. Patient is very well appearing. She has an appointment on August 4th with PCP. Will trial meclizine and encourage follow up with PCP at that time.  [SU]    Clinical Course User Index [SU] Odell Balls, PA-C                                 Medical Decision Making Amount and/or Complexity of Data Reviewed Labs: ordered.        Final diagnoses:  Dizziness    ED Discharge Orders          Ordered    meclizine (ANTIVERT) 25 MG tablet  3 times daily PRN        09/14/23 0624               Odell Balls, PA-C 09/14/23 9374    Theadore Ozell HERO, MD 09/14/23 [phone]

## 2023-09-14 NOTE — Discharge Instructions (Signed)
 As we discussed, your exam and your lab studies are all normal. Take Meclizine for symptoms of dizziness and keep your scheduled appointment with your doctor on the 4th for recheck.

## 2023-09-15 ENCOUNTER — Other Ambulatory Visit: Payer: Self-pay

## 2023-09-15 ENCOUNTER — Emergency Department (HOSPITAL_BASED_OUTPATIENT_CLINIC_OR_DEPARTMENT_OTHER)
Admission: EM | Admit: 2023-09-15 | Discharge: 2023-09-16 | Disposition: A | Discharge status: Home / Self Care | Attending: Emergency Medicine | Admitting: Emergency Medicine

## 2023-09-15 DIAGNOSIS — R519 Headache, unspecified: Secondary | ICD-10-CM | POA: Diagnosis present

## 2023-09-15 NOTE — ED Triage Notes (Signed)
 Pt POV reporting persistent headache with associated dizziness and blurred vision. Seen in ED 7/25, told to return if sx persist.

## 2023-09-16 ENCOUNTER — Emergency Department (HOSPITAL_BASED_OUTPATIENT_CLINIC_OR_DEPARTMENT_OTHER)

## 2023-09-16 MED ORDER — KETOROLAC TROMETHAMINE 30 MG/ML IJ SOLN
15.0000 mg | Freq: Once | INTRAMUSCULAR | Status: AC
  Administered 2023-09-16: 15 mg via INTRAVENOUS
  Filled 2023-09-16: qty 1

## 2023-09-16 MED ORDER — PROCHLORPERAZINE EDISYLATE 10 MG/2ML IJ SOLN
10.0000 mg | Freq: Once | INTRAMUSCULAR | Status: AC
  Administered 2023-09-16: 10 mg via INTRAVENOUS
  Filled 2023-09-16: qty 2

## 2023-09-16 NOTE — ED Provider Notes (Signed)
 [place] EMERGENCY DEPARTMENT AT [facility]  Provider Note  CSN: [id number] Arrival date & time: 09/15/23 2112  History Chief Complaint  Patient presents with   Headache    Amanda Cox is a 48 y.o. female reports several days of left sided head pressure, dizziness and blurry vision. She initially thought it might have been related to a dog bite earlier in the month, but dog was vaccinated. She was seen at [facility] and told it might be tension headache. She was at [facility] yesterday and told it might be vertigo. She reports no improvement in symptoms prompting her to return tonight. She has not had any difficulty with walking, falling. She has been able to drive without difficulty although bright lights hurt her eyes.    Home Medications Prior to Admission medications   Medication Sig Start Date End Date Taking? Authorizing Provider  albuterol  (PROVENTIL ) (2.5 MG/3ML) 0.083% nebulizer solution Take 3 mLs (2.5 mg total) by nebulization every 6 (six) hours as needed. Patient not taking: Reported on 07/11/2023 02/17/22   Rising, Asberry, PA-C  fluticasone  (FLONASE ) 50 MCG/ACT nasal spray Place 1 spray into both nostrils daily. 02/04/23   Dreama, [place]  N, FNP  ibuprofen  (ADVIL ) 800 MG tablet Take 1 tablet (800 mg total) by mouth every 8 (eight) hours as needed. 07/11/23   Rudy Carlin LABOR, MD  meclizine  (ANTIVERT ) 25 MG tablet Take 1 tablet (25 mg total) by mouth 3 (three) times daily as needed for dizziness. 09/14/23   Odell Balls, PA-C  Misc. Devices KIT Nebulizer machine with tubing and mask. Reactive airway ICD cod J68.3 02/17/22   Rising, Asberry, PA-C  Multiple Vitamin (MULTIVITAMIN) tablet Take 1 tablet by mouth daily.    [provider]  Jaquita Kale Triphasic (NORGESTIMATE -ETHINYL ESTRADIOL  TRIPHASIC) 0.18/0.215/0.25 MG-25 MCG tab Take 1 tablet by mouth daily. 07/12/23   Rudy Carlin LABOR, MD  olmesartan (BENICAR) 40 MG tablet Take 40 mg by mouth  daily. 01/23/22   [provider]  torsemide (DEMADEX) 20 MG tablet Take by mouth. 01/23/22   [provider]  VALTREX  500 MG tablet Take 1 tablet (500 mg total) by mouth daily. 07/11/23   Rudy Carlin LABOR, MD     Allergies    Sulfa antibiotics   Review of Systems   Review of Systems Please see HPI for pertinent positives and negatives  Physical Exam BP 138/79   Pulse 82   Temp 99.6 F (37.6 C) (Oral)   Resp 18   Ht 5' 3 (1.6 m)   Wt 102.1 kg   LMP 09/06/2023   SpO2 98%   BMI 39.86 kg/m   Physical Exam Vitals and nursing note reviewed.  Constitutional:      Appearance: Normal appearance.  HENT:     Head: Normocephalic and atraumatic.     Nose: Nose normal.     Mouth/Throat:     Mouth: Mucous membranes are moist.  Eyes:     Extraocular Movements: Extraocular movements intact.     Conjunctiva/sclera: Conjunctivae normal.  Cardiovascular:     Rate and Rhythm: Normal rate.  Pulmonary:     Effort: Pulmonary effort is normal.     Breath sounds: Normal breath sounds.  Abdominal:     General: Abdomen is flat.     Palpations: Abdomen is soft.     Tenderness: There is no abdominal tenderness.  Musculoskeletal:        General: No swelling. Normal range of motion.     Cervical  back: Neck supple.  Skin:    General: Skin is warm and dry.  Neurological:     General: No focal deficit present.     Mental Status: She is alert and oriented to person, place, and time.     Cranial Nerves: No cranial nerve deficit.     Sensory: No sensory deficit.     Motor: No weakness.     Gait: Gait normal.  Psychiatric:        Mood and Affect: Mood normal.     ED Results / Procedures / Treatments   EKG None  Procedures Procedures  Medications Ordered in the ED Medications  ketorolac  (TORADOL ) 30 MG/ML injection 15 mg (15 mg Intravenous Given 09/16/23 0042)  prochlorperazine  (COMPAZINE ) injection 10 mg (10 mg Intravenous Given 09/16/23 0043)    Initial  Impression and Plan  Patient here with persistent headache, nonspecific dizziness and blurred vision. She was at [facility] last night with normal labs, but no imaging. I discussed imaging with the patient including recommendation for MRI which we do not have here. She does not want to go back to [facility] and would like to have a CT done here instead. Will give a headache cocktail in the meantime. She is scheduled for PCP visit in about a week.   ED Course   Clinical Course as of 09/16/23 0129  [place] Sep 16, 2023  0040 I personally viewed the images from radiology studies and agree with radiologist interpretation: CT is normal.  [CS]  0128 Patient reports improved headache. She feels comfortable going home. Recommend PCP follow up as scheduled, RTED if symptoms worsen in the meantime.  [CS]    Clinical Course User Index [CS] Roselyn Carlin NOVAK, MD     MDM Rules/Calculators/A&P Medical Decision Making Problems Addressed: Acute nonintractable headache, unspecified headache type: acute illness or injury  Amount and/or Complexity of Data Reviewed Radiology: ordered and independent interpretation performed. Decision-making details documented in ED Course.  Risk Prescription drug management.     Final Clinical Impression(s) / ED Diagnoses Final diagnoses:  Acute nonintractable headache, unspecified headache type    Rx / DC Orders ED Discharge Orders     None        Roselyn Carlin NOVAK, MD 09/16/23 [phone]

## 2023-09-16 NOTE — ED Notes (Signed)
 Patient transported to CT

## 2023-09-25 ENCOUNTER — Other Ambulatory Visit: Payer: Self-pay | Admitting: Internal Medicine

## 2023-09-25 DIAGNOSIS — R42 Dizziness and giddiness: Secondary | ICD-10-CM

## 2023-10-12 ENCOUNTER — Other Ambulatory Visit

## 2023-10-15 ENCOUNTER — Ambulatory Visit (INDEPENDENT_AMBULATORY_CARE_PROVIDER_SITE_OTHER): Admitting: Internal Medicine

## 2023-10-15 ENCOUNTER — Encounter: Payer: Self-pay | Admitting: Internal Medicine

## 2023-10-15 VITALS — BP 130/74 | HR 91 | Ht 63.0 in | Wt 228.6 lb

## 2023-10-15 DIAGNOSIS — R0609 Other forms of dyspnea: Secondary | ICD-10-CM | POA: Diagnosis not present

## 2023-10-15 NOTE — Patient Instructions (Signed)
 ICD-10-CM   1. DOE (dyspnea on exertion)  R06.09      Cause not known as yet; stiff heart muscle (Diastolic Dysfunction) could be playing a role  Plan  - do blood work d-dimer and BNP  - based on results might need other workup =- do HRCT supine and prone  - do ECHO - do full PFT  Followup  - video visit with APP or Dr R to discuss all results   - if results normal, then we will get CPST bike test with EIB challenge

## 2023-10-15 NOTE — Progress Notes (Signed)
 OV 10/15/2023  Subjective:  Patient ID: Amanda Cox, female , DOB: [birth year] , age 48 y.o. , MRN: [id number] , ADDRESS: [address] [place] [address] PCP Vernon Velna SAUNDERS, MD Patient Care Team: Vernon Velna SAUNDERS, MD as PCP - General (Internal Medicine) Jeffrie Oneil BROCKS, MD as PCP - Cardiology (Cardiology) Verla Ivanoff, PA-C as Physician Assistant (Chiropractic Medicine) Bonner Ade, MD as Consulting Physician (Physical Medicine and Rehabilitation)  This Provider for this visit: Treatment Team:  Attending Provider: Geronimo Amel, MD    10/15/2023 -   Chief Complaint  Patient presents with   Consult    Sob only with exertion Pt does not monitor o2 at home.      HPI Amanda Cox 49 y.o. -Sister of my patient Amanda Cox. SAw her grandmother Amanda Cox today  . 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  C/o dyspnea on exertion since Jan 2025. In July 2025, had  1 episode of orthopnea  at 5pm. HAving class 3 doe. No wheezing. NO cough.  In past has seen [facility] for palpitations. 2 x ZIO monitor was ok per her hx. She has on-off ankle swelling x some years. She feels this is not due to her obesity which has been stable  No prior E No Cance hx No COPD hx No asthma hx      SIT STAND TEST - goal 15 times   10/15/2023    O2 used ra   PRobe - finter or forehead finger   Number sit and stand completed - goal 15 15   Time taken to complete 36 sec   Resting Pulse Ox/HR/Dyspnea  100% and 79/min and dyspnea of 7/10    Peak measures 96 % and 108/min and dyspnea of 7/10   Final Pulse Ox/HR 100% and 88/min and dyspnea of 6/10   Desaturated </= 88% no   Desaturated <= 3% points no   Got Tachycardic >/= 90/min YES   Miscellaneous comments Very dyspneic      CT Chest data from date: dec 2023  - personally visualized and independently interpreted : YES - my findings are: AS  BELOW arrative & Impression  CLINICAL DATA:  Chest pain   EXAM: CT CHEST WITHOUT CONTRAST   TECHNIQUE: Multidetector CT imaging of the chest was performed following the standard protocol without IV contrast.   RADIATION DOSE REDUCTION: This exam was performed according to the departmental dose-optimization program which includes automated exposure control, adjustment of the mA and/or kV according to patient size and/or use of iterative reconstruction technique.   COMPARISON:  09/24/2004   FINDINGS: Cardiovascular: No significant vascular findings. Normal heart size. No pericardial effusion.   Mediastinum/Nodes: No enlarged mediastinal or axillary lymph nodes. Thyroid  gland, trachea, and esophagus demonstrate no significant findings.   Lungs/Pleura: Lungs are clear. No pleural effusion or pneumothorax.   Upper Abdomen: No acute abnormality.   Musculoskeletal: No chest wall mass or suspicious bone lesions identified.   IMPRESSION: Unremarkable examination of the chest.     Electronically Signed   By: Fonda Field M.D.   On: 02/04/2022 12:04    PFT      No data to display             LAB RESULTS last 96 hours No results found.       has a past medical history of Chronic insomnia (11/08/2016), Dyspareunia, Essential hypertension, benign, Genital herpes, unspecified, GERD (gastroesophageal  reflux disease), Headache syndrome (11/08/2016), Irregular menstrual cycle, Lumbar spondylosis, Meralgia paraesthetica, right (09/10/2020), Neuralgia and neuritis, unspecified, Obesity (BMI 30-39.9), and Surveillance of previously prescribed contraceptive pill.   reports that she has never smoked. She has never been exposed to tobacco smoke. She has never used smokeless tobacco.  Past Surgical History:  Procedure Laterality Date   BREAST REDUCTION SURGERY  2005   CESAREAN SECTION  2003   REDUCTION MAMMAPLASTY      Allergies  Allergen Reactions   Sulfa  Antibiotics Shortness Of Breath    Immunization History  Administered Date(s) Administered   Tdap 04/29/2010    Family History  Problem Relation Age of Onset   Hypertension Mother    Diabetes Mother    Hyperlipidemia Mother    Arthritis Mother    Hypertension Father    Diabetes Father    Arthritis Sister    Diabetes Sister    Arthritis Maternal Grandmother    Arthritis Paternal Grandmother    Hypertension Paternal Grandmother    Congenital heart disease Other    Colon cancer Neg Hx    Rectal cancer Neg Hx    Esophageal cancer Neg Hx    Stomach cancer Neg Hx    Liver cancer Neg Hx      Current Outpatient Medications:    fluticasone  (FLONASE ) 50 MCG/ACT nasal spray, Place 1 spray into both nostrils daily., Disp: 9.9 mL, Rfl: 2   ibuprofen  (ADVIL ) 800 MG tablet, Take 1 tablet (800 mg total) by mouth every 8 (eight) hours as needed., Disp: 30 tablet, Rfl: 5   meclizine  (ANTIVERT ) 25 MG tablet, Take 1 tablet (25 mg total) by mouth 3 (three) times daily as needed for dizziness., Disp: 30 tablet, Rfl: 0   Misc. Devices KIT, Nebulizer machine with tubing and mask. Reactive airway ICD cod J68.3, Disp: 1 kit, Rfl: 0   Multiple Vitamin (MULTIVITAMIN) tablet, Take 1 tablet by mouth daily., Disp: , Rfl:    Norgestim-Eth Estrad Triphasic (NORGESTIMATE -ETHINYL ESTRADIOL  TRIPHASIC) 0.18/0.215/0.25 MG-25 MCG tab, Take 1 tablet by mouth daily., Disp: 84 tablet, Rfl: 11   olmesartan (BENICAR) 40 MG tablet, Take 40 mg by mouth daily., Disp: , Rfl:    torsemide (DEMADEX) 20 MG tablet, Take by mouth., Disp: , Rfl:    VALTREX  500 MG tablet, Take 1 tablet (500 mg total) by mouth daily., Disp: 30 tablet, Rfl: 11   albuterol  (PROVENTIL ) (2.5 MG/3ML) 0.083% nebulizer solution, Take 3 mLs (2.5 mg total) by nebulization every 6 (six) hours as needed. (Patient not taking: Reported on 10/15/2023), Disp: 75 mL, Rfl: 2      Objective:   Vitals:   10/15/23 1533  BP: 130/74  Pulse: 91  SpO2: 99%   Weight: 228 lb 9.6 oz (103.7 kg)  Height: 5' 3 (1.6 m)    Estimated body mass index is 40.49 kg/m as calculated from the following:   Height as of this encounter: 5' 3 (1.6 m).   Weight as of this encounter: 228 lb 9.6 oz (103.7 kg).  @WEIGHTCHANGE @  Filed Weights   10/15/23 1533  Weight: 228 lb 9.6 oz (103.7 kg)     Physical Exam   General: No distress. Looks well O2 at rest: no Cane present: no Sitting in wheel chair: no Frail: no Obese: no Neuro: Alert and Oriented x 3. GCS 15. Speech normal Psych: Pleasant Resp:  Barrel Chest - no.  Wheeze - no, Crackles - no, No overt respiratory distress CVS: Normal heart sounds. Murmurs -  no Ext: Stigmata of Connective Tissue Disease - no HEENT: Normal upper airway. PEERL +. No post nasal drip        Assessment/     Assessment & Plan DOE (dyspnea on exertion)    PLAN Patient Instructions     ICD-10-CM   1. DOE (dyspnea on exertion)  R06.09      Cause not known as yet; stiff heart muscle (Diastolic Dysfunction) could be playing a role  Plan  - do blood work d-dimer and BNP  - based on results might need other workup =- do HRCT supine and prone  - do ECHO - do full PFT  Followup  - video visit with APP or Dr R to discuss all results   - if results normal, then we will get CPST bike test with EIB challenge    FOLLOWUP    Return for video visit next several weeks to discuss results.    SIGNATURE    Dr. Dorethia Cave, M.D., F.C.C.P,  Pulmonary and Critical Care Medicine Staff Physician, [facility] Center Director - Interstitial Lung Disease  Program  Pulmonary Fibrosis [facility] at [facility] [place], [place], [address]  Pager: [phone], If no answer or between  15:00h - 7:00h: call [phone] Telephone: [phone]  5:58 PM 10/15/2023

## 2023-10-20 ENCOUNTER — Other Ambulatory Visit: Payer: Self-pay

## 2023-10-20 ENCOUNTER — Ambulatory Visit (HOSPITAL_BASED_OUTPATIENT_CLINIC_OR_DEPARTMENT_OTHER)
Admission: RE | Admit: 2023-10-20 | Discharge: 2023-10-20 | Disposition: A | Discharge status: Home / Self Care | Source: Ambulatory Visit | Attending: Internal Medicine | Admitting: Internal Medicine

## 2023-10-20 ENCOUNTER — Emergency Department (HOSPITAL_COMMUNITY)
Admission: EM | Admit: 2023-10-20 | Discharge: 2023-10-20 | Disposition: A | Discharge status: Home / Self Care | Attending: Emergency Medicine | Admitting: Emergency Medicine

## 2023-10-20 ENCOUNTER — Encounter (HOSPITAL_COMMUNITY): Payer: Self-pay

## 2023-10-20 ENCOUNTER — Emergency Department (HOSPITAL_COMMUNITY)

## 2023-10-20 DIAGNOSIS — R110 Nausea: Secondary | ICD-10-CM | POA: Diagnosis not present

## 2023-10-20 DIAGNOSIS — Z79899 Other long term (current) drug therapy: Secondary | ICD-10-CM | POA: Diagnosis not present

## 2023-10-20 DIAGNOSIS — R519 Headache, unspecified: Secondary | ICD-10-CM | POA: Diagnosis present

## 2023-10-20 DIAGNOSIS — R0609 Other forms of dyspnea: Secondary | ICD-10-CM | POA: Diagnosis present

## 2023-10-20 DIAGNOSIS — I10 Essential (primary) hypertension: Secondary | ICD-10-CM | POA: Diagnosis not present

## 2023-10-20 LAB — COMPREHENSIVE METABOLIC PANEL WITH GFR
ALT: 12 U/L (ref 0–44)
AST: 15 U/L (ref 15–41)
Albumin: 3.8 g/dL (ref 3.5–5.0)
Alkaline Phosphatase: 48 U/L (ref 38–126)
Anion gap: 11 (ref 5–15)
BUN: 8 mg/dL (ref 6–20)
CO2: 23 mmol/L (ref 22–32)
Calcium: 8.9 mg/dL (ref 8.9–10.3)
Chloride: 102 mmol/L (ref 98–111)
Creatinine, Ser: 0.84 mg/dL (ref 0.44–1.00)
GFR, Estimated: >60 mL/min (ref >60)
Glucose, Bld: 137 mg/dL — ABNORMAL HIGH (ref 70–99)
Potassium: 3.9 mmol/L (ref 3.5–5.1)
Sodium: 136 mmol/L (ref 135–145)
Total Bilirubin: 0.9 mg/dL (ref 0.0–1.2)
Total Protein: 7.1 g/dL (ref 6.5–8.1)

## 2023-10-20 LAB — CBC
HCT: 40.3 % (ref 36.0–46.0)
Hemoglobin: 12.8 g/dL (ref 12.0–15.0)
MCH: 28 pg (ref 26.0–34.0)
MCHC: 31.8 g/dL (ref 30.0–36.0)
MCV: 88.2 fL (ref 80.0–100.0)
Platelets: 363 K/uL (ref 150–400)
RBC: 4.57 MIL/uL (ref 3.87–5.11)
RDW: 12.6 % (ref 11.5–15.5)
WBC: 8.6 K/uL (ref 4.0–10.5)
nRBC: 0 % (ref 0.0–0.2)

## 2023-10-20 LAB — HCG, SERUM, QUALITATIVE: Preg, Serum: NEGATIVE

## 2023-10-20 MED ORDER — DEXAMETHASONE SODIUM PHOSPHATE 10 MG/ML IJ SOLN
10.0000 mg | Freq: Once | INTRAMUSCULAR | Status: AC
  Administered 2023-10-20: 10 mg via INTRAVENOUS
  Filled 2023-10-20: qty 1

## 2023-10-20 MED ORDER — PROCHLORPERAZINE EDISYLATE 10 MG/2ML IJ SOLN
10.0000 mg | Freq: Once | INTRAMUSCULAR | Status: AC
  Administered 2023-10-20: 10 mg via INTRAVENOUS
  Filled 2023-10-20: qty 2

## 2023-10-20 MED ORDER — GADOBUTROL 1 MMOL/ML IV SOLN
10.0000 mL | Freq: Once | INTRAVENOUS | Status: AC | PRN
  Administered 2023-10-20: 10 mL via INTRAVENOUS

## 2023-10-20 MED ORDER — DIPHENHYDRAMINE HCL 50 MG/ML IJ SOLN
25.0000 mg | Freq: Once | INTRAMUSCULAR | Status: AC
  Administered 2023-10-20: 25 mg via INTRAVENOUS
  Filled 2023-10-20: qty 1

## 2023-10-20 MED ORDER — KETOROLAC TROMETHAMINE 15 MG/ML IJ SOLN
15.0000 mg | Freq: Once | INTRAMUSCULAR | Status: AC
  Administered 2023-10-20: 15 mg via INTRAVENOUS
  Filled 2023-10-20: qty 1

## 2023-10-20 NOTE — ED Notes (Signed)
 ED called lab inquiring about blood received. Reports still in-processing blood. Pending acknowledgment of receiving blood. Collected 30 minutes ago in triage.

## 2023-10-20 NOTE — ED Notes (Signed)
 Lab messaged re: blood

## 2023-10-20 NOTE — Discharge Instructions (Addendum)
 Thank you for letting us  evaluate you today.  Your MRI with contrast was negative for acute abnormalities.  Please follow-up with your neurologist for further management.  You may use Tylenol , ibuprofen  intermittently every 8 hours as needed for headache.  Please make sure to stay adequately hydrated.  Stay away from caffeine.  Decrease screen time.  Return to Emergency Department if you experience loss of vision, blurred vision, sudden significantly debilitating headache especially with fever, worsening symptoms

## 2023-10-20 NOTE — ED Notes (Addendum)
 Pt c/o HA, loss of appetite, nausea, blurry vision, and dizziness. Denies fever, vomiting, diarrhea, bleeding or urinary sx. Denies other pain. Describes as all over head. Onset July. Has not seen neuro MD. Alert, NAD, calm, interactive, scrolling on phone and/ or resting with eyes closed.

## 2023-10-20 NOTE — ED Notes (Signed)
 Pt in MRI.

## 2023-10-20 NOTE — ED Provider Notes (Signed)
 [place] EMERGENCY DEPARTMENT AT [facility] Provider Note   CSN: [id number] Arrival date & time: 10/20/23  1624     Patient presents with: Headache and Nausea   Amanda Cox is a 48 y.o. female.  {Add pertinent medical, surgical, social history, OB history to HPI:32947}  Headache      Prior to Admission medications   Medication Sig Start Date End Date Taking? Authorizing Provider  albuterol  (PROVENTIL ) (2.5 MG/3ML) 0.083% nebulizer solution Take 3 mLs (2.5 mg total) by nebulization every 6 (six) hours as needed. Patient not taking: Reported on 10/15/2023 02/17/22   Rising, Asberry, PA-C  fluticasone  (FLONASE ) 50 MCG/ACT nasal spray Place 1 spray into both nostrils daily. 02/04/23   Dreama, [place]  N, FNP  ibuprofen  (ADVIL ) 800 MG tablet Take 1 tablet (800 mg total) by mouth every 8 (eight) hours as needed. 07/11/23   Rudy Carlin LABOR, MD  meclizine  (ANTIVERT ) 25 MG tablet Take 1 tablet (25 mg total) by mouth 3 (three) times daily as needed for dizziness. 09/14/23   Odell Balls, PA-C  Misc. Devices KIT Nebulizer machine with tubing and mask. Reactive airway ICD cod J68.3 02/17/22   Rising, Asberry, PA-C  Multiple Vitamin (MULTIVITAMIN) tablet Take 1 tablet by mouth daily.    [provider]  Jaquita Kale Triphasic (NORGESTIMATE -ETHINYL ESTRADIOL  TRIPHASIC) 0.18/0.215/0.25 MG-25 MCG tab Take 1 tablet by mouth daily. 07/12/23   Rudy Carlin LABOR, MD  olmesartan (BENICAR) 40 MG tablet Take 40 mg by mouth daily. 01/23/22   [provider]  torsemide (DEMADEX) 20 MG tablet Take by mouth. 01/23/22   [provider]  VALTREX  500 MG tablet Take 1 tablet (500 mg total) by mouth daily. 07/11/23   Rudy Carlin LABOR, MD    Allergies: Sulfa antibiotics    Review of Systems  Neurological:  Positive for headaches.    Updated Vital Signs BP (!) 148/86 (BP Location: Right Arm)   Pulse (!) 103   Temp 99.3 F (37.4 C)   Resp 17    LMP 10/05/2023   SpO2 100%   Physical Exam  (all labs ordered are listed, but only abnormal results are displayed) Labs Reviewed  COMPREHENSIVE METABOLIC PANEL WITH GFR - Abnormal; Notable for the following components:      Result Value   Glucose, Bld 137 (*)    All other components within normal limits  CBC  HCG, SERUM, QUALITATIVE    EKG: None  Radiology: MR Brain W and Wo Contrast Result Date: 10/20/2023 CLINICAL DATA:  Initial evaluation for acute headache. EXAM: MRI HEAD WITHOUT AND WITH CONTRAST TECHNIQUE: Multiplanar, multiecho pulse sequences of the brain and surrounding structures were obtained without and with intravenous contrast. CONTRAST:  10mL GADAVIST  GADOBUTROL  1 MMOL/ML IV SOLN COMPARISON:  None Available. FINDINGS: Brain: Cerebral volume within normal limits for age. No focal parenchymal signal abnormality. No abnormal foci of restricted diffusion to suggest acute or subacute ischemia. Gray-white matter differentiation well maintained. No encephalomalacia to suggest chronic cortical infarction or other insult. No foci of susceptibility artifact indicative of acute or chronic intracranial blood products. No mass lesion, midline shift or mass effect. Ventricles normal in size and morphology without hydrocephalus. No extra-axial fluid collection. Pituitary gland and suprasellar region within normal limits. No abnormal enhancement. Vascular: Major intracranial vascular flow voids are well maintained. Skull and upper cervical spine: Craniocervical junction within normal limits. Visualized upper cervical spine demonstrates no significant finding. Bone marrow signal intensity within normal limits. No scalp soft tissue  abnormality. Sinuses/Orbits: Globes and orbital soft tissues are within normal limits. Paranasal sinuses are largely clear. No significant mastoid effusion. Other: None. IMPRESSION: Normal brain MRI. No acute intracranial abnormality or findings to explain patient's  symptoms. Electronically Signed   By: Morene Hoard M.D.   On: 10/20/2023 19:34    {Document cardiac monitor, telemetry assessment procedure when appropriate:32947} Procedures   Medications Ordered in the ED  prochlorperazine  (COMPAZINE ) injection 10 mg (10 mg Intravenous Given 10/20/23 1830)  diphenhydrAMINE  (BENADRYL ) injection 25 mg (25 mg Intravenous Given 10/20/23 1828)  ketorolac  (TORADOL ) 15 MG/ML injection 15 mg (15 mg Intravenous Given 10/20/23 1831)  gadobutrol  (GADAVIST ) 1 MMOL/ML injection 10 mL (10 mLs Intravenous Contrast Given 10/20/23 1912)      {Click here for ABCD2, HEART and other calculators REFRESH Note before signing:1}                              Medical Decision Making Amount and/or Complexity of Data Reviewed Labs: ordered. Radiology: ordered.  Risk Prescription drug management.   ***  Will f/u with her preferred neurologist she found on google. Refused internal neuro referral  {Document critical care time when appropriate  Document review of labs and clinical decision tools ie CHADS2VASC2, etc  Document your independent review of radiology images and any outside records  Document your discussion with family members, caretakers and with consultants  Document social determinants of health affecting pt's care  Document your decision making why or why not admission, treatments were needed:32947:::1}   Final diagnoses:  None    ED Discharge Orders     None

## 2023-10-20 NOTE — ED Triage Notes (Signed)
 Pt c.o intermittent headache and nausea since July. Pt had MRI done on August 22nd without contrast, pt has to go back and get one with contrast due to seeing spots on the MRI. Pt states I think it is related to getting bit by a dog on my finger because ever since that day Ive been going downhill

## 2023-10-27 ENCOUNTER — Ambulatory Visit: Payer: Self-pay | Admitting: Internal Medicine

## 2023-10-27 DIAGNOSIS — J45909 Unspecified asthma, uncomplicated: Secondary | ICD-10-CM

## 2023-10-27 DIAGNOSIS — J42 Unspecified chronic bronchitis: Secondary | ICD-10-CM

## 2023-10-27 NOTE — Progress Notes (Signed)
 There is aiwrway inflammation oin CT scan that could be contributing to shortnesss of breath. I am ordering a blood allergy test. Please get it done. Thanks

## 2023-10-29 ENCOUNTER — Other Ambulatory Visit (INDEPENDENT_AMBULATORY_CARE_PROVIDER_SITE_OTHER)

## 2023-10-29 DIAGNOSIS — R0609 Other forms of dyspnea: Secondary | ICD-10-CM | POA: Diagnosis not present

## 2023-10-29 DIAGNOSIS — J42 Unspecified chronic bronchitis: Secondary | ICD-10-CM

## 2023-10-29 DIAGNOSIS — J45909 Unspecified asthma, uncomplicated: Secondary | ICD-10-CM

## 2023-10-29 LAB — BRAIN NATRIURETIC PEPTIDE: Pro B Natriuretic peptide (BNP): 75 pg/mL (ref 0.0–100.0)

## 2023-10-30 ENCOUNTER — Telehealth: Payer: Self-pay | Admitting: Internal Medicine

## 2023-10-30 DIAGNOSIS — R7989 Other specified abnormal findings of blood chemistry: Secondary | ICD-10-CM

## 2023-10-30 DIAGNOSIS — R0609 Other forms of dyspnea: Secondary | ICD-10-CM

## 2023-10-30 LAB — D-DIMER, QUANTITATIVE: D-Dimer, Quant: 0.54 ug{FEU}/mL — ABNORMAL HIGH (ref <0.50)

## 2023-10-30 NOTE — Telephone Encounter (Signed)
 D-dimer elevation:   Latest Reference Range & Units 02/28/18 03:47 10/29/23 08:16  D-Dimer, Quant <0.50 mcg/mL FEU 0.37 0.54 (H)  (H): Data is abnormally high   Barely high  Plan   - VQ scan -  I ordered .pls check if done correctly  - you might need to order a cxr too

## 2023-10-31 ENCOUNTER — Ambulatory Visit

## 2023-10-31 NOTE — Telephone Encounter (Signed)
 Pt is aware of results and voiced her understanding.  CXR ordered.  PCC's, please schedule VQ. Thanks

## 2023-10-31 NOTE — Telephone Encounter (Signed)
**Note De-identified  [place] Obfuscation** Please advise 

## 2023-10-31 NOTE — Telephone Encounter (Signed)
 Clinically you do not have sepsis - there is chance insurance can deny it. Are you willing to wait til results of RAST allergy and VQ scan are back before we order procalcitioning etc. (You do not have SIRS - so you do not have Sepsis)

## 2023-11-01 LAB — ALLERGEN PROFILE, PERENNIAL ALLERGEN IGE

## 2023-11-01 NOTE — Telephone Encounter (Signed)
 FYI

## 2023-11-01 NOTE — Telephone Encounter (Signed)
 Ok no problem. I am happy to order Procalcitonin if you wish but possible without  fever and with nromal wbc insurance can deny. Let me know again if you want to wait or get it now. Either or is fine with me

## 2023-11-01 NOTE — Progress Notes (Signed)
 Blood allergy test is normal.  Proceed with VQ scan.  Just had a phone message that if she wants procalcitonin we can get that done.

## 2023-11-02 NOTE — Telephone Encounter (Signed)
 Ordered procalcitonin

## 2023-11-02 NOTE — Telephone Encounter (Signed)
 fyi

## 2023-11-02 NOTE — Telephone Encounter (Signed)
 NFN

## 2023-11-05 ENCOUNTER — Encounter (HOSPITAL_COMMUNITY): Payer: Self-pay

## 2023-11-05 ENCOUNTER — Ambulatory Visit (HOSPITAL_COMMUNITY)
Admission: EM | Admit: 2023-11-05 | Discharge: 2023-11-05 | Disposition: A | Discharge status: Home / Self Care | Attending: Emergency Medicine | Admitting: Emergency Medicine

## 2023-11-05 ENCOUNTER — Other Ambulatory Visit (INDEPENDENT_AMBULATORY_CARE_PROVIDER_SITE_OTHER)

## 2023-11-05 ENCOUNTER — Ambulatory Visit (HOSPITAL_COMMUNITY)
Admission: RE | Admit: 2023-11-05 | Discharge: 2023-11-05 | Disposition: A | Discharge status: Home / Self Care | Source: Ambulatory Visit | Attending: Internal Medicine | Admitting: Internal Medicine

## 2023-11-05 ENCOUNTER — Other Ambulatory Visit: Payer: Self-pay

## 2023-11-05 ENCOUNTER — Encounter (HOSPITAL_COMMUNITY)
Admission: RE | Admit: 2023-11-05 | Discharge: 2023-11-05 | Disposition: A | Discharge status: Home / Self Care | Source: Ambulatory Visit | Attending: Internal Medicine | Admitting: Internal Medicine

## 2023-11-05 DIAGNOSIS — R7989 Other specified abnormal findings of blood chemistry: Secondary | ICD-10-CM | POA: Insufficient documentation

## 2023-11-05 DIAGNOSIS — K5900 Constipation, unspecified: Secondary | ICD-10-CM | POA: Diagnosis not present

## 2023-11-05 DIAGNOSIS — L02212 Cutaneous abscess of back [any part, except buttock]: Secondary | ICD-10-CM

## 2023-11-05 DIAGNOSIS — K2100 Gastro-esophageal reflux disease with esophagitis, without bleeding: Secondary | ICD-10-CM | POA: Diagnosis not present

## 2023-11-05 MED ORDER — ALUM & MAG HYDROXIDE-SIMETH 200-200-20 MG/5ML PO SUSP
ORAL | Status: AC
  Filled 2023-11-05: qty 30

## 2023-11-05 MED ORDER — TECHNETIUM TO 99M ALBUMIN AGGREGATED
4.4000 | Freq: Once | INTRAVENOUS | Status: AC | PRN
  Administered 2023-11-05: 4.4 via INTRAVENOUS

## 2023-11-05 MED ORDER — LIDOCAINE VISCOUS HCL 2 % MT SOLN
15.0000 mL | Freq: Once | OROMUCOSAL | Status: AC
  Administered 2023-11-05: 15 mL via OROMUCOSAL

## 2023-11-05 MED ORDER — DOXYCYCLINE HYCLATE 100 MG PO CAPS: 100.0000 mg | ORAL_CAPSULE | Freq: Two times a day (BID) | ORAL | 0 refills | Status: AC

## 2023-11-05 MED ORDER — ALUM & MAG HYDROXIDE-SIMETH 200-200-20 MG/5ML PO SUSP
30.0000 mL | Freq: Once | ORAL | Status: AC
  Administered 2023-11-05: 30 mL via ORAL

## 2023-11-05 MED ORDER — LIDOCAINE VISCOUS HCL 2 % MT SOLN
OROMUCOSAL | Status: AC
  Filled 2023-11-05: qty 15

## 2023-11-05 MED ORDER — FAMOTIDINE 20 MG PO TABS: 20.0000 mg | ORAL_TABLET | Freq: Two times a day (BID) | ORAL | 2 refills | Status: AC

## 2023-11-05 NOTE — Discharge Instructions (Addendum)
 Reflux: You can use famotidine  (Pepcid ) at home as needed, 1-2 times daily.  Avoid fatty, greasy, citrusy, spicy, caffeine and alcohol as these can worsen symptoms.   Abscess: Doxycycline  twice daily for 7 days. Take with food to avoid upset stomach. Finish the full course! It can take about 3 full days to start working against infection. Ibuprofen /Advil  can be used for pain, but keep in mind it can worsen reflux symptoms. You can take with food and with pepcid  to reduce symptoms.  Tylenol  can also be used for pain -- it will not upset the stomach.  I recommend warm compress or heating pad to the area.

## 2023-11-05 NOTE — ED Provider Notes (Signed)
 [facility]    CSN: [id number] Arrival date & time: 11/05/23  1149      History   Chief Complaint Chief Complaint  Patient presents with   Abscess   Heartburn    HPI Amanda Cox is a 48 y.o. female.  Here with bump on the back. Reports it has been there for years. But 6 days ago it started to become painful. Not draining No fevers No meds yet  Also reporting acid reflux when laying flat last night. She ate a lot of butter and onions for dinner. Now having burning in the throat.  Does have GERD history Has used pepto, tums, and honey   Past Medical History:  Diagnosis Date   Chronic insomnia 11/08/2016   Dyspareunia    Essential hypertension, benign    Genital herpes, unspecified    GERD (gastroesophageal reflux disease)    Headache syndrome 11/08/2016   Irregular menstrual cycle    Lumbar spondylosis    Meralgia paraesthetica, right 09/10/2020   Neuralgia and neuritis, unspecified    Obesity (BMI 30-39.9)    Surveillance of previously prescribed contraceptive pill     Patient Active Problem List   Diagnosis Date Noted   Pre-diabetes 02/09/2021   Meralgia paraesthetica, right 09/10/2020   Routine general medical examination at a health care facility 08/02/2018   Constipation 04/11/2018   Chronic insomnia 11/08/2016   Snoring 08/19/2013   BMI 35.0-35.9,adult 08/19/2013   Essential hypertension 07/10/2013   Genital herpes 07/17/2012    Past Surgical History:  Procedure Laterality Date   BREAST REDUCTION SURGERY  2005   CESAREAN SECTION  2003   REDUCTION MAMMAPLASTY      OB History     Gravida  2   Para  1   Term  1   Preterm      AB  1   Living  1      SAB      IAB  1   Ectopic      Multiple      Live Births  1            Home Medications    Prior to Admission medications   Medication Sig Start Date End Date Taking? Authorizing Provider  doxycycline  (VIBRAMYCIN ) 100 MG capsule Take 1 capsule (100 mg  total) by mouth 2 (two) times daily for 7 days. 11/05/23 11/12/23 Yes Hanae Waiters, [place], PA-C  famotidine  (PEPCID ) 20 MG tablet Take 1 tablet (20 mg total) by mouth 2 (two) times daily. 11/05/23  Yes Vester Balthazor, [place], PA-C  albuterol  (PROVENTIL ) (2.5 MG/3ML) 0.083% nebulizer solution Take 3 mLs (2.5 mg total) by nebulization every 6 (six) hours as needed. Patient not taking: Reported on 10/15/2023 02/17/22   Sophya Vanblarcom, [place], PA-C  fluticasone  (FLONASE ) 50 MCG/ACT nasal spray Place 1 spray into both nostrils daily. 02/04/23   Dreama, [place]  N, FNP  ibuprofen  (ADVIL ) 800 MG tablet Take 1 tablet (800 mg total) by mouth every 8 (eight) hours as needed. 07/11/23   Rudy Carlin LABOR, MD  Misc. Devices KIT Nebulizer machine with tubing and mask. Reactive airway ICD cod J68.3 02/17/22   Stephanny Tsutsui, [place], PA-C  Multiple Vitamin (MULTIVITAMIN) tablet Take 1 tablet by mouth daily.    [provider]  Jaquita Kale Triphasic (NORGESTIMATE -ETHINYL ESTRADIOL  TRIPHASIC) 0.18/0.215/0.25 MG-25 MCG tab Take 1 tablet by mouth daily. 07/12/23   Rudy Carlin LABOR, MD  olmesartan (BENICAR) 40 MG tablet Take 40 mg by mouth daily. 01/23/22   [provider]  torsemide (DEMADEX) 20 MG tablet Take by mouth. 01/23/22   [provider]  VALTREX  500 MG tablet Take 1 tablet (500 mg total) by mouth daily. 07/11/23   Rudy Carlin LABOR, MD    Family History Family History  Problem Relation Age of Onset   Hypertension Mother    Diabetes Mother    Hyperlipidemia Mother    Arthritis Mother    Hypertension Father    Diabetes Father    Arthritis Sister    Diabetes Sister    Arthritis Maternal Grandmother    Arthritis Paternal Grandmother    Hypertension Paternal Grandmother    Congenital heart disease Other    Colon cancer Neg Hx    Rectal cancer Neg Hx    Esophageal cancer Neg Hx    Stomach cancer Neg Hx    Liver cancer Neg Hx     Social History Social History   Tobacco Use   Smoking  status: Never    Passive exposure: Never   Smokeless tobacco: Never  Vaping Use   Vaping status: Never Used  Substance Use Topics   Alcohol use: Yes    Alcohol/week: 0.0 standard drinks of alcohol    Comment: rare- once every 3 months   Drug use: No     Allergies   Sulfa antibiotics   Review of Systems Review of Systems  As per HPI  Physical Exam Triage Vital Signs ED Triage Vitals  Encounter Vitals Group     BP 11/05/23 1425 (!) 144/89     Girls Systolic BP Percentile --      Girls Diastolic BP Percentile --      Boys Systolic BP Percentile --      Boys Diastolic BP Percentile --      Pulse Rate 11/05/23 1425 78     Resp 11/05/23 1425 16     Temp 11/05/23 1425 99.8 F (37.7 C)     Temp Source 11/05/23 1425 Oral     SpO2 11/05/23 1425 97 %     Weight --      Height --      Head Circumference --      Peak Flow --      Pain Score 11/05/23 1431 9     Pain Loc --      Pain Education --      Exclude from Growth Chart --    No data found.  Updated Vital Signs BP (!) 144/89 (BP Location: Right Arm)   Pulse 78   Temp 99.2 F (37.3 C) (Oral)   Resp 16   LMP 11/01/2023 (Approximate)   SpO2 97%    Physical Exam Vitals and nursing note reviewed.  Constitutional:      General: She is not in acute distress.    Appearance: Normal appearance.  HENT:     Mouth/Throat:     Pharynx: Oropharynx is clear.  Cardiovascular:     Rate and Rhythm: Normal rate and regular rhythm.     Pulses: Normal pulses.     Heart sounds: Normal heart sounds.  Pulmonary:     Effort: Pulmonary effort is normal.     Breath sounds: Normal breath sounds.  Abdominal:     General: There is no distension.     Palpations: Abdomen is soft.     Tenderness: There is no abdominal tenderness. There is no guarding.  Skin:    Findings: Abscess and erythema present.  Comments: Indurated area with erythema and tenderness left back. No drainage.   Neurological:     Mental Status: She is  alert and oriented to person, place, and time.     UC Treatments / Results  Labs (all labs ordered are listed, but only abnormal results are displayed) Labs Reviewed - No data to display  EKG  Radiology No results found.  Procedures Procedures (including critical care time)  Medications Ordered in UC Medications  alum & mag hydroxide-simeth (MAALOX/MYLANTA) 200-200-20 MG/5ML suspension 30 mL (30 mLs Oral Given 11/05/23 1506)  lidocaine  (XYLOCAINE ) 2 % viscous mouth solution 15 mL (15 mLs Mouth/Throat Given 11/05/23 1506)    Initial Impression / Assessment and Plan / UC Course  I have reviewed the triage vital signs and the nursing notes.  Pertinent labs & imaging results that were available during my care of the patient were reviewed by me and considered in my medical decision making (see chart for details).  GI cocktail given for reflux, esophagitis Pepcid  BID, food triggers to avoid, PCP follow up  Abscess Doxycyline BID x 7 days Warm compress, pain control  Agrees to plan, all questions answered   Final Clinical Impressions(s) / UC Diagnoses   Final diagnoses:  Abscess of back  Gastroesophageal reflux disease with esophagitis without hemorrhage     Discharge Instructions      Reflux: You can use famotidine  (Pepcid ) at home as needed, 1-2 times daily.  Avoid fatty, greasy, citrusy, spicy, caffeine and alcohol as these can worsen symptoms.   Abscess: Doxycycline  twice daily for 7 days. Take with food to avoid upset stomach. Finish the full course! It can take about 3 full days to start working against infection. Ibuprofen /Advil  can be used for pain, but keep in mind it can worsen reflux symptoms. You can take with food and with pepcid  to reduce symptoms.  Tylenol  can also be used for pain -- it will not upset the stomach.  I recommend warm compress or heating pad to the area.      ED Prescriptions     Medication Sig Dispense Auth. Provider   famotidine   (PEPCID ) 20 MG tablet Take 1 tablet (20 mg total) by mouth 2 (two) times daily. 60 tablet Priseis Cratty, PA-C   doxycycline  (VIBRAMYCIN ) 100 MG capsule Take 1 capsule (100 mg total) by mouth 2 (two) times daily for 7 days. 14 capsule Jentry Mcqueary, Asberry, PA-C      PDMP not reviewed this encounter.   Jeryl [place]berry, [place] 11/05/23 [id number]

## 2023-11-05 NOTE — ED Triage Notes (Signed)
 Patient here today with c/o a bump on left mid part of back that has been there for a couple years but since Tuesday she has been having some pain and tenderness around it.   Patient is also here with c/o heartburn and ST since last night. She tried taking Pepto Bismol, Tums, and a tablespoon of honey with no relief.

## 2023-11-05 NOTE — ED Triage Notes (Signed)
 Pt POV reporting abscess on back, requesting it to be drained, seen at [facility] today, prescribed doxycycline , told to com eto ED if pain persists.

## 2023-11-06 ENCOUNTER — Emergency Department (HOSPITAL_BASED_OUTPATIENT_CLINIC_OR_DEPARTMENT_OTHER)
Admission: EM | Admit: 2023-11-06 | Discharge: 2023-11-06 | Disposition: A | Discharge status: Home / Self Care | Attending: Emergency Medicine | Admitting: Emergency Medicine

## 2023-11-06 DIAGNOSIS — L0291 Cutaneous abscess, unspecified: Secondary | ICD-10-CM

## 2023-11-06 MED ORDER — HYDROCODONE-ACETAMINOPHEN 5-325 MG PO TABS: 1.0000 | ORAL_TABLET | Freq: Four times a day (QID) | ORAL | 0 refills | Status: AC | PRN

## 2023-11-06 MED ORDER — LIDOCAINE HCL (PF) 1 % IJ SOLN
5.0000 mL | Freq: Once | INTRAMUSCULAR | Status: AC
  Administered 2023-11-06: 5 mL
  Filled 2023-11-06: qty 5

## 2023-11-06 NOTE — ED Notes (Signed)
 Pt has abscess left mid back. Was seen at [facility] yesterday and pain became worse last night and decided to come in for further evaluation

## 2023-11-06 NOTE — Discharge Instructions (Signed)
 Begin taking doxycycline  as previously prescribed.    Take ibuprofen  600 mg every 6 hours as needed for pain.  Begin taking hydrocodone  as prescribed as needed for pain not relieved with ibuprofen .  Apply warm compresses as frequently as possible for the next several days.

## 2023-11-06 NOTE — ED Provider Notes (Signed)
 [place] [facility] AT [facility] Provider Note   CSN: [id number] Arrival date & time: 11/05/23  2323     Patient presents with: Abscess   Amanda Cox is a 48 y.o. female.   Patient is a 47 year old female presenting with complaints of abscess on her left mid back.  She was seen at urgent care earlier today and prescribed doxycycline .  She was told to come to the ER if her symptoms worsen.  She describes ongoing discomfort and is requesting incision and drainage.       Prior to Admission medications   Medication Sig Start Date End Date Taking? Authorizing Provider  albuterol  (PROVENTIL ) (2.5 MG/3ML) 0.083% nebulizer solution Take 3 mLs (2.5 mg total) by nebulization every 6 (six) hours as needed. Patient not taking: Reported on 10/15/2023 02/17/22   Rising, [place]berry, PA-C  doxycycline  (VIBRAMYCIN ) 100 MG capsule Take 1 capsule (100 mg total) by mouth 2 (two) times daily for 7 days. 11/05/23 11/12/23  Rising, Asberry, PA-C  famotidine  (PEPCID ) 20 MG tablet Take 1 tablet (20 mg total) by mouth 2 (two) times daily. 11/05/23   Rising, Asberry, PA-C  fluticasone  (FLONASE ) 50 MCG/ACT nasal spray Place 1 spray into both nostrils daily. 02/04/23   Dreama, [place]  N, FNP  ibuprofen  (ADVIL ) 800 MG tablet Take 1 tablet (800 mg total) by mouth every 8 (eight) hours as needed. 07/11/23   Rudy Carlin LABOR, MD  Misc. Devices KIT Nebulizer machine with tubing and mask. Reactive airway ICD cod J68.3 02/17/22   Rising, [place]berry, PA-C  Multiple Vitamin (MULTIVITAMIN) tablet Take 1 tablet by mouth daily.    [provider]  Jaquita Kale Triphasic (NORGESTIMATE -ETHINYL ESTRADIOL  TRIPHASIC) 0.18/0.215/0.25 MG-25 MCG tab Take 1 tablet by mouth daily. 07/12/23   Rudy Carlin LABOR, MD  olmesartan (BENICAR) 40 MG tablet Take 40 mg by mouth daily. 01/23/22   [provider]  torsemide (DEMADEX) 20 MG tablet Take by mouth. 01/23/22   [provider]   VALTREX  500 MG tablet Take 1 tablet (500 mg total) by mouth daily. 07/11/23   Rudy Carlin LABOR, MD    Allergies: Sulfa antibiotics    Review of Systems  All other systems reviewed and are negative.   Updated Vital Signs BP (!) 147/87 (BP Location: Right Arm)   Pulse 92   Temp 99.3 F (37.4 C) (Oral)   Resp 15   Ht 5' 3 (1.6 m)   Wt 103 kg   LMP 11/01/2023 (Approximate)   SpO2 97%   BMI 40.21 kg/m   Physical Exam Vitals and nursing note reviewed.  Constitutional:      Appearance: Normal appearance.  Pulmonary:     Effort: Pulmonary effort is normal.  Skin:    General: Skin is warm and dry.     Comments: There is a 2 cm x 3 cm, indurated, erythematous, tender area noted to the left mid back just below the shoulder blade.  Neurological:     Mental Status: She is alert and oriented to person, place, and time.     (all labs ordered are listed, but only abnormal results are displayed) Labs Reviewed - No data to display  EKG: None  Radiology: No results found.   Procedures  INCISION AND DRAINAGE Performed by: Vicenta Able Consent: Verbal consent obtained. Risks and benefits: risks, benefits and alternatives were discussed Type: abscess  Body area: left mid back  Anesthesia: local infiltration  Incision was made with a scalpel.  Local anesthetic: lidocaine   1% without epinephrine  Anesthetic total: 2 ml  Complexity: complex Blunt dissection to break up loculations  Drainage: purulent  Drainage amount: Slight  Packing material: No packing placed  Patient tolerance: Patient tolerated the procedure well with no immediate complications.    Medications Ordered in the ED  lidocaine  (PF) (XYLOCAINE ) 1 % injection 5 mL (has no administration in time range)                                    Medical Decision Making Risk Prescription drug management.   Patient presenting with an abscess to her back.  This was incised and drained as above.   Patient tolerated the procedure well.  She will be discharged with pain medication.  She has already received doxycycline  from urgent care and has been taking this medication.     Final diagnoses:  None    ED Discharge Orders     None          Geroldine Berg, MD 11/06/23 [phone]

## 2023-11-08 ENCOUNTER — Ambulatory Visit: Payer: Self-pay | Admitting: Internal Medicine

## 2023-11-08 LAB — PROCALCITONIN: Procalcitonin: <0.1 ng/mL (ref <0.10)

## 2023-11-09 ENCOUNTER — Ambulatory Visit (HOSPITAL_COMMUNITY)
Admission: EM | Admit: 2023-11-09 | Discharge: 2023-11-09 | Disposition: A | Discharge status: Home / Self Care | Attending: Family Medicine | Admitting: Family Medicine

## 2023-11-09 ENCOUNTER — Ambulatory Visit (HOSPITAL_BASED_OUTPATIENT_CLINIC_OR_DEPARTMENT_OTHER)

## 2023-11-09 ENCOUNTER — Ambulatory Visit: Payer: Self-pay | Admitting: Internal Medicine

## 2023-11-09 ENCOUNTER — Encounter (HOSPITAL_COMMUNITY): Payer: Self-pay

## 2023-11-09 DIAGNOSIS — R0609 Other forms of dyspnea: Secondary | ICD-10-CM | POA: Diagnosis not present

## 2023-11-09 DIAGNOSIS — L0291 Cutaneous abscess, unspecified: Secondary | ICD-10-CM | POA: Diagnosis not present

## 2023-11-09 LAB — ECHOCARDIOGRAM COMPLETE
AR max vel: 2.06 cm2
AV Area VTI: 1.81 cm2
AV Area mean vel: 1.94 cm2
AV Mean grad: 3 mmHg
AV Peak grad: 6.4 mmHg
Ao pk vel: 1.26 m/s
Area-P 1/2: 5.23 cm2
S' Lateral: 3.26 cm

## 2023-11-09 MED ORDER — CLINDAMYCIN HCL 300 MG PO CAPS: 300.0000 mg | ORAL_CAPSULE | Freq: Three times a day (TID) | ORAL | 0 refills | Status: AC

## 2023-11-09 NOTE — Progress Notes (Signed)
Normal V/Q scan.  No PE.

## 2023-11-09 NOTE — Discharge Instructions (Signed)
 You were seen today for follow up of your abscess.  I have added a 2nd antibiotic to start today.  Please continue warm compresses, and avoid using any topical antibiotic on it at this time.  Please return if you continue to have issues.

## 2023-11-09 NOTE — ED Triage Notes (Signed)
 Patient presents to the office for recheck on back abscess. Patient states she is taking Doxy.

## 2023-11-09 NOTE — ED Provider Notes (Signed)
 [facility]    CSN: [id number] Arrival date & time: 11/09/23  0957      History   Chief Complaint Chief Complaint  Patient presents with   Abscess    HPI Amanda Cox is a 48 y.o. female.    Abscess  Patient is here for an abscess on her back.  She was seen here on 9/15 for abscess, given doxy x 7 days.  Pain worsened, went to the ER the next day, opened/drained.  Continued with doxy.   She continues with throbbing pain, and so is here for f/u.  Will run out of doxy in several days.   .urgetn   Past Medical History:  Diagnosis Date   Chronic insomnia 11/08/2016   Dyspareunia    Essential hypertension, benign    Genital herpes, unspecified    GERD (gastroesophageal reflux disease)    Headache syndrome 11/08/2016   Irregular menstrual cycle    Lumbar spondylosis    Meralgia paraesthetica, right 09/10/2020   Neuralgia and neuritis, unspecified    Obesity (BMI 30-39.9)    Surveillance of previously prescribed contraceptive pill     Patient Active Problem List   Diagnosis Date Noted   Pre-diabetes 02/09/2021   Meralgia paraesthetica, right 09/10/2020   Routine general medical examination at a health care facility 08/02/2018   Constipation 04/11/2018   Chronic insomnia 11/08/2016   Snoring 08/19/2013   BMI 35.0-35.9,adult 08/19/2013   Essential hypertension 07/10/2013   Genital herpes 07/17/2012    Past Surgical History:  Procedure Laterality Date   BREAST REDUCTION SURGERY  2005   CESAREAN SECTION  2003   REDUCTION MAMMAPLASTY      OB History     Gravida  2   Para  1   Term  1   Preterm      AB  1   Living  1      SAB      IAB  1   Ectopic      Multiple      Live Births  1            Home Medications    Prior to Admission medications   Medication Sig Start Date End Date Taking? Authorizing Provider  doxycycline  (VIBRAMYCIN ) 100 MG capsule Take 1 capsule (100 mg total) by mouth 2 (two) times daily for  7 days. 11/05/23 11/12/23 Yes Rising, [place], PA-C  famotidine  (PEPCID ) 20 MG tablet Take 1 tablet (20 mg total) by mouth 2 (two) times daily. 11/05/23  Yes Rising, Asberry, PA-C  fluticasone  (FLONASE ) 50 MCG/ACT nasal spray Place 1 spray into both nostrils daily. 02/04/23  Yes Garrison, [place]  N, FNP  Multiple Vitamin (MULTIVITAMIN) tablet Take 1 tablet by mouth daily.   Yes [provider]  Jaquita Kale Triphasic (NORGESTIMATE -ETHINYL ESTRADIOL  TRIPHASIC) 0.18/0.215/0.25 MG-25 MCG tab Take 1 tablet by mouth daily. 07/12/23  Yes Rudy Carlin LABOR, MD  olmesartan (BENICAR) 40 MG tablet Take 40 mg by mouth daily. 01/23/22  Yes [provider]  torsemide (DEMADEX) 20 MG tablet Take by mouth. 01/23/22  Yes [provider]  albuterol  (PROVENTIL ) (2.5 MG/3ML) 0.083% nebulizer solution Take 3 mLs (2.5 mg total) by nebulization every 6 (six) hours as needed. Patient not taking: Reported on 10/15/2023 02/17/22   Rising, [place]berry, PA-C  HYDROcodone -acetaminophen  (NORCO/VICODIN) 5-325 MG tablet Take 1-2 tablets by mouth every 6 (six) hours as needed. 11/06/23   Geroldine Berg, MD  ibuprofen  (ADVIL ) 800 MG tablet Take 1 tablet (800  mg total) by mouth every 8 (eight) hours as needed. 07/11/23   Rudy Carlin LABOR, MD  Misc. Devices KIT Nebulizer machine with tubing and mask. Reactive airway ICD cod J68.3 02/17/22   Rising, [place]berry, PA-C  VALTREX  500 MG tablet Take 1 tablet (500 mg total) by mouth daily. 07/11/23   Rudy Carlin LABOR, MD    Family History Family History  Problem Relation Age of Onset   Hypertension Mother    Diabetes Mother    Hyperlipidemia Mother    Arthritis Mother    Hypertension Father    Diabetes Father    Arthritis Sister    Diabetes Sister    Arthritis Maternal Grandmother    Arthritis Paternal Grandmother    Hypertension Paternal Grandmother    Congenital heart disease Other    Colon cancer Neg Hx    Rectal cancer Neg Hx    Esophageal cancer Neg Hx     Stomach cancer Neg Hx    Liver cancer Neg Hx     Social History Social History   Tobacco Use   Smoking status: Never    Passive exposure: Never   Smokeless tobacco: Never  Vaping Use   Vaping status: Never Used  Substance Use Topics   Alcohol use: Yes    Alcohol/week: 0.0 standard drinks of alcohol    Comment: rare- once every 3 months   Drug use: No     Allergies   Sulfa antibiotics   Review of Systems Review of Systems  Constitutional: Negative.   HENT: Negative.    Respiratory: Negative.    Cardiovascular: Negative.   Gastrointestinal: Negative.   Skin:  Positive for wound.     Physical Exam Triage Vital Signs ED Triage Vitals  Encounter Vitals Group     BP 11/09/23 1038 (!) 138/91     Girls Systolic BP Percentile --      Girls Diastolic BP Percentile --      Boys Systolic BP Percentile --      Boys Diastolic BP Percentile --      Pulse Rate 11/09/23 1038 88     Resp 11/09/23 1038 18     Temp 11/09/23 1038 98.6 F (37 C)     Temp Source 11/09/23 1038 Oral     SpO2 11/09/23 1038 98 %     Weight --      Height --      Head Circumference --      Peak Flow --      Pain Score 11/09/23 1040 7     Pain Loc --      Pain Education --      Exclude from Growth Chart --    No data found.  Updated Vital Signs BP (!) 138/91 (BP Location: Left Arm)   Pulse 88   Temp 98.6 F (37 C) (Oral)   Resp 18   LMP 11/01/2023 (Approximate)   SpO2 98%   Visual Acuity Right Eye Distance:   Left Eye Distance:   Bilateral Distance:    Right Eye Near:   Left Eye Near:    Bilateral Near:     Physical Exam Constitutional:      Appearance: Normal appearance. She is normal weight.  Skin:    Comments: At the left mid back a bandage is in place.  Once removed there is a small amount of drainage noted;  there is a small opening to the abscess;  there is still surrounding induration, erythema and tenderness;  unable to squeeze any drainage from the opening.   Neurological:     General: No focal deficit present.     Mental Status: She is alert.  Psychiatric:        Mood and Affect: Mood normal.      UC Treatments / Results  Labs (all labs ordered are listed, but only abnormal results are displayed) Labs Reviewed - No data to display  EKG   Radiology No results found.  Procedures Procedures (including critical care time)  Medications Ordered in UC Medications - No data to display  Initial Impression / Assessment and Plan / UC Course  I have reviewed the triage vital signs and the nursing notes.  Pertinent labs & imaging results that were available during my care of the patient were reviewed by me and considered in my medical decision making (see chart for details).  Final Clinical Impressions(s) / UC Diagnoses   Final diagnoses:  Abscess     Discharge Instructions      You were seen today for follow up of your abscess.  I have added a 2nd antibiotic to start today.  Please continue warm compresses, and avoid using any topical antibiotic on it at this time.  Please return if you continue to have issues.     ED Prescriptions     Medication Sig Dispense Auth. Provider   clindamycin  (CLEOCIN ) 300 MG capsule Take 1 capsule (300 mg total) by mouth 3 (three) times daily for 7 days. 21 capsule Darral Longs, MD      PDMP not reviewed this encounter.   Darral Longs, MD 11/09/23 1056

## 2023-11-26 ENCOUNTER — Ambulatory Visit

## 2023-11-26 ENCOUNTER — Ambulatory Visit
Admission: RE | Admit: 2023-11-26 | Discharge: 2023-11-26 | Disposition: A | Discharge status: Home / Self Care | Source: Ambulatory Visit | Attending: Internal Medicine | Admitting: Internal Medicine

## 2023-11-26 DIAGNOSIS — Z1231 Encounter for screening mammogram for malignant neoplasm of breast: Secondary | ICD-10-CM

## 2023-11-27 ENCOUNTER — Ambulatory Visit (HOSPITAL_BASED_OUTPATIENT_CLINIC_OR_DEPARTMENT_OTHER): Admitting: Internal Medicine

## 2023-11-27 ENCOUNTER — Encounter (HOSPITAL_BASED_OUTPATIENT_CLINIC_OR_DEPARTMENT_OTHER): Attending: Internal Medicine | Admitting: Internal Medicine

## 2023-11-27 DIAGNOSIS — I10 Essential (primary) hypertension: Secondary | ICD-10-CM | POA: Insufficient documentation

## 2023-11-27 DIAGNOSIS — L02219 Cutaneous abscess of trunk, unspecified: Secondary | ICD-10-CM | POA: Insufficient documentation

## 2023-11-30 ENCOUNTER — Other Ambulatory Visit (HOSPITAL_COMMUNITY)

## 2023-12-02 NOTE — Progress Notes (Signed)
 Normal echo.

## 2024-01-08 ENCOUNTER — Encounter (HOSPITAL_COMMUNITY): Payer: Self-pay

## 2024-01-08 ENCOUNTER — Ambulatory Visit (HOSPITAL_COMMUNITY)
Admission: EM | Admit: 2024-01-08 | Discharge: 2024-01-08 | Disposition: A | Discharge status: Home / Self Care | Attending: Physician Assistant | Admitting: Physician Assistant

## 2024-01-08 DIAGNOSIS — B9689 Other specified bacterial agents as the cause of diseases classified elsewhere: Secondary | ICD-10-CM | POA: Diagnosis present

## 2024-01-08 DIAGNOSIS — N760 Acute vaginitis: Secondary | ICD-10-CM | POA: Insufficient documentation

## 2024-01-08 MED ORDER — METRONIDAZOLE 500 MG PO TABS
500.0000 mg | ORAL_TABLET | Freq: Two times a day (BID) | ORAL | 0 refills | Status: AC
Start: 2024-01-08 — End: ?

## 2024-01-08 NOTE — Discharge Instructions (Addendum)
 Return if any problems.

## 2024-01-08 NOTE — ED Triage Notes (Signed)
 Pt c/o vaginal odor and itching since her MC ended on 11/11. States unsure if a tampon is still in.

## 2024-01-08 NOTE — ED Provider Notes (Signed)
 [facility]    CSN: [id number] Arrival date & time: 01/08/24  1754      History   Chief Complaint Chief Complaint  Patient presents with   vaginal odor    HPI Amanda Cox is a 48 y.o. female.   Pt complains of a vaginal odor.  Patient is concerned that she could have a retained tampon.  Patient reports she has had bacterial vaginitis in the past.  Patient denies any fever or chills she has not had nausea or vomiting she has not had any abdominal pain.  Patient denies any concerns for STD risk.   The history is provided by the patient. No language interpreter was used.    Past Medical History:  Diagnosis Date   Chronic insomnia 11/08/2016   Dyspareunia    Essential hypertension, benign    Genital herpes, unspecified    GERD (gastroesophageal reflux disease)    Headache syndrome 11/08/2016   Irregular menstrual cycle    Lumbar spondylosis    Meralgia paraesthetica, right 09/10/2020   Neuralgia and neuritis, unspecified    Obesity (BMI 30-39.9)    Surveillance of previously prescribed contraceptive pill     Patient Active Problem List   Diagnosis Date Noted   Pre-diabetes 02/09/2021   Meralgia paraesthetica, right 09/10/2020   Routine general medical examination at a health care facility 08/02/2018   Constipation 04/11/2018   Chronic insomnia 11/08/2016   Snoring 08/19/2013   BMI 35.0-35.9,adult 08/19/2013   Essential hypertension 07/10/2013   Genital herpes 07/17/2012    Past Surgical History:  Procedure Laterality Date   BREAST REDUCTION SURGERY  2005   CESAREAN SECTION  2003   REDUCTION MAMMAPLASTY      OB History     Gravida  2   Para  1   Term  1   Preterm      AB  1   Living  1      SAB      IAB  1   Ectopic      Multiple      Live Births  1            Home Medications    Prior to Admission medications   Medication Sig Start Date End Date Taking? Authorizing Provider  metroNIDAZOLE  (FLAGYL ) 500  MG tablet Take 1 tablet (500 mg total) by mouth 2 (two) times daily. 01/08/24  Yes Flint Raring K, PA-C  albuterol  (PROVENTIL ) (2.5 MG/3ML) 0.083% nebulizer solution Take 3 mLs (2.5 mg total) by nebulization every 6 (six) hours as needed. Patient not taking: Reported on 10/15/2023 02/17/22   Rising, Asberry, PA-C  famotidine  (PEPCID ) 20 MG tablet Take 1 tablet (20 mg total) by mouth 2 (two) times daily. 11/05/23   Rising, Asberry, PA-C  fluticasone  (FLONASE ) 50 MCG/ACT nasal spray Place 1 spray into both nostrils daily. 02/04/23   Dreama, [place]  N, FNP  HYDROcodone -acetaminophen  (NORCO/VICODIN) 5-325 MG tablet Take 1-2 tablets by mouth every 6 (six) hours as needed. 11/06/23   Geroldine Berg, MD  ibuprofen  (ADVIL ) 800 MG tablet Take 1 tablet (800 mg total) by mouth every 8 (eight) hours as needed. 07/11/23   Rudy Carlin LABOR, MD  Misc. Devices KIT Nebulizer machine with tubing and mask. Reactive airway ICD cod J68.3 02/17/22   Rising, Asberry, PA-C  Multiple Vitamin (MULTIVITAMIN) tablet Take 1 tablet by mouth daily.    [provider]  Jaquita Kale Triphasic (NORGESTIMATE -ETHINYL ESTRADIOL  TRIPHASIC) 0.18/0.215/0.25 MG-25 MCG tab Take 1 tablet  by mouth daily. 07/12/23   Rudy Carlin LABOR, MD  olmesartan (BENICAR) 40 MG tablet Take 40 mg by mouth daily. 01/23/22   [provider]  torsemide (DEMADEX) 20 MG tablet Take by mouth. 01/23/22   [provider]  VALTREX  500 MG tablet Take 1 tablet (500 mg total) by mouth daily. 07/11/23   Rudy Carlin LABOR, MD    Family History Family History  Problem Relation Age of Onset   Hypertension Mother    Diabetes Mother    Hyperlipidemia Mother    Arthritis Mother    Hypertension Father    Diabetes Father    Arthritis Sister    Diabetes Sister    Arthritis Maternal Grandmother    Arthritis Paternal Grandmother    Hypertension Paternal Grandmother    Congenital heart disease Other    Colon cancer Neg Hx    Rectal  cancer Neg Hx    Esophageal cancer Neg Hx    Stomach cancer Neg Hx    Liver cancer Neg Hx     Social History Social History   Tobacco Use   Smoking status: Never    Passive exposure: Never   Smokeless tobacco: Never  Vaping Use   Vaping status: Never Used  Substance Use Topics   Alcohol use: Yes    Alcohol/week: 0.0 standard drinks of alcohol    Comment: rare- once every 3 months   Drug use: No     Allergies   Sulfa antibiotics   Review of Systems Review of Systems  All other systems reviewed and are negative.    Physical Exam Triage Vital Signs ED Triage Vitals [01/08/24 1850]  Encounter Vitals Group     BP (!) 151/86     Girls Systolic BP Percentile      Girls Diastolic BP Percentile      Boys Systolic BP Percentile      Boys Diastolic BP Percentile      Pulse Rate 76     Resp 18     Temp 99.4 F (37.4 C)     Temp Source Oral     SpO2 97 %     Weight      Height      Head Circumference      Peak Flow      Pain Score      Pain Loc      Pain Education      Exclude from Growth Chart    No data found.  Updated Vital Signs BP (!) 151/86 (BP Location: Left Arm)   Pulse 76   Temp 99.4 F (37.4 C) (Oral)   Resp 18   LMP 12/28/2023 (Exact Date)   SpO2 97%   Visual Acuity Right Eye Distance:   Left Eye Distance:   Bilateral Distance:    Right Eye Near:   Left Eye Near:    Bilateral Near:     Physical Exam Vitals and nursing note reviewed.  Constitutional:      Appearance: She is well-developed.  HENT:     Head: Normocephalic.  Cardiovascular:     Rate and Rhythm: Normal rate.  Pulmonary:     Effort: Pulmonary effort is normal.  Abdominal:     General: There is no distension.  Genitourinary:    Comments: Thick white vaginal discharge, no foreign body seen, no foreign body palpated. Musculoskeletal:        General: Normal range of motion.     Cervical back: Normal range of  motion.  Skin:    General: Skin is warm.  Neurological:      General: No focal deficit present.     Mental Status: She is alert and oriented to person, place, and time.      UC Treatments / Results  Labs (all labs ordered are listed, but only abnormal results are displayed) Labs Reviewed  CERVICOVAGINAL ANCILLARY ONLY    EKG   Radiology No results found.  Procedures Procedures (including critical care time)  Medications Ordered in UC Medications - No data to display  Initial Impression / Assessment and Plan / UC Course  I have reviewed the triage vital signs and the nursing notes.  Pertinent labs & imaging results that were available during my care of the patient were reviewed by me and considered in my medical decision making (see chart for details).     Patient's symptoms most consistent with a bacterial vaginitis.  Patient is given a prescription for metronidazole  test are pending. Final Clinical Impressions(s) / UC Diagnoses   Final diagnoses:  BV (bacterial vaginosis)     Discharge Instructions      Return if any problems.    ED Prescriptions     Medication Sig Dispense Auth. Provider   metroNIDAZOLE  (FLAGYL ) 500 MG tablet Take 1 tablet (500 mg total) by mouth 2 (two) times daily. 14 tablet Tracie Lindbloom K, PA-C      PDMP not reviewed this encounter. An After Visit Summary was printed and given to the patient.       Flint Sonny POUR, PA-C 01/08/24 2007

## 2024-01-09 LAB — CERVICOVAGINAL ANCILLARY ONLY
Bacterial Vaginitis (gardnerella): NEGATIVE
Candida Glabrata: NEGATIVE
Candida Vaginitis: NEGATIVE
Chlamydia: NEGATIVE
Comment: NEGATIVE
Comment: NEGATIVE
Comment: NEGATIVE
Comment: NEGATIVE
Comment: NEGATIVE
Comment: NORMAL
Neisseria Gonorrhea: NEGATIVE
Trichomonas: NEGATIVE

## 2024-01-23 ENCOUNTER — Other Ambulatory Visit: Payer: Self-pay

## 2024-01-23 MED ORDER — VALACYCLOVIR HCL 1 G PO TABS: 500.0000 mg | ORAL_TABLET | Freq: Every day | ORAL | 2 refills | Status: AC

## 2024-01-31 NOTE — Telephone Encounter (Signed)
-----   Message from Harlene Lapine, MD sent at 01/31/2024  3:59 PM EST ----- Can you please schedule 2 months individual FU? Thanks!

## 2024-02-04 ENCOUNTER — Ambulatory Visit: Admitting: Obstetrics

## 2024-02-04 ENCOUNTER — Encounter: Payer: Self-pay | Admitting: Obstetrics

## 2024-02-04 ENCOUNTER — Other Ambulatory Visit (HOSPITAL_COMMUNITY)
Admission: RE | Admit: 2024-02-04 | Discharge: 2024-02-04 | Disposition: A | Source: Ambulatory Visit | Attending: Obstetrics | Admitting: Obstetrics

## 2024-02-04 VITALS — BP 115/77 | HR 87 | Ht 63.0 in | Wt 231.9 lb

## 2024-02-04 DIAGNOSIS — N898 Other specified noninflammatory disorders of vagina: Secondary | ICD-10-CM | POA: Diagnosis present

## 2024-02-04 DIAGNOSIS — B9689 Other specified bacterial agents as the cause of diseases classified elsewhere: Secondary | ICD-10-CM | POA: Diagnosis not present

## 2024-02-04 DIAGNOSIS — N760 Acute vaginitis: Secondary | ICD-10-CM | POA: Diagnosis not present

## 2024-02-04 DIAGNOSIS — N946 Dysmenorrhea, unspecified: Secondary | ICD-10-CM

## 2024-02-04 NOTE — Progress Notes (Signed)
 Patient ID: Amanda Cox, female   DOB: 1975/09/13, 48 y.o.   MRN: [id number]  HPI [name] is a 48 y.o. female.  Complains of malodorous vaginal discharge. HPI  Past Medical History:  Diagnosis Date   Chronic insomnia 11/08/2016   Dyspareunia    Essential hypertension, benign    Genital herpes, unspecified    GERD (gastroesophageal reflux disease)    Headache syndrome 11/08/2016   Irregular menstrual cycle    Lumbar spondylosis    Meralgia paraesthetica, right 09/10/2020   Neuralgia and neuritis, unspecified    Obesity (BMI 30-39.9)    Surveillance of previously prescribed contraceptive pill     Past Surgical History:  Procedure Laterality Date   BREAST REDUCTION SURGERY  2005   CESAREAN SECTION  2003   REDUCTION MAMMAPLASTY      Family History  Problem Relation Age of Onset   Hypertension Mother    Diabetes Mother    Hyperlipidemia Mother    Arthritis Mother    Hypertension Father    Diabetes Father    Arthritis Sister    Diabetes Sister    Arthritis Maternal Grandmother    Arthritis Paternal Grandmother    Hypertension Paternal Grandmother    Congenital heart disease Other    Colon cancer Neg Hx    Rectal cancer Neg Hx    Esophageal cancer Neg Hx    Stomach cancer Neg Hx    Liver cancer Neg Hx     Social History Social History[1]  Allergies[2]  Current Outpatient Medications  Medication Sig Dispense Refill   famotidine  (PEPCID ) 20 MG tablet Take 1 tablet (20 mg total) by mouth 2 (two) times daily. 60 tablet 2   fluticasone  (FLONASE ) 50 MCG/ACT nasal spray Place 1 spray into both nostrils daily. 9.9 mL 2   ibuprofen  (ADVIL ) 800 MG tablet Take 1 tablet (800 mg total) by mouth every 8 (eight) hours as needed. 30 tablet 5   Multiple Vitamin (MULTIVITAMIN) tablet Take 1 tablet by mouth daily.     Norgestim-Eth Estrad Triphasic (NORGESTIMATE -ETHINYL ESTRADIOL  TRIPHASIC) 0.18/0.215/0.25 MG-25 MCG tab Take 1 tablet by mouth daily. 84  tablet 11   olmesartan (BENICAR) 40 MG tablet Take 40 mg by mouth daily.     torsemide (DEMADEX) 20 MG tablet Take by mouth.     valACYclovir  (VALTREX ) 1000 MG tablet Take 0.5 tablets (500 mg total) by mouth daily. 30 tablet 2   albuterol  (PROVENTIL ) (2.5 MG/3ML) 0.083% nebulizer solution Take 3 mLs (2.5 mg total) by nebulization every 6 (six) hours as needed. (Patient not taking: Reported on 10/15/2023) 75 mL 2   HYDROcodone -acetaminophen  (NORCO/VICODIN) 5-325 MG tablet Take 1-2 tablets by mouth every 6 (six) hours as needed. (Patient not taking: Reported on 02/04/2024) 10 tablet 0   metroNIDAZOLE  (FLAGYL ) 500 MG tablet Take 1 tablet (500 mg total) by mouth 2 (two) times daily. (Patient not taking: Reported on 02/04/2024) 14 tablet 0   Misc. Devices KIT Nebulizer machine with tubing and mask. Reactive airway ICD cod J68.3 (Patient not taking: Reported on 02/04/2024) 1 kit 0   No current facility-administered medications for this visit.    Review of Systems Review of Systems Constitutional: negative for fatigue and weight loss Respiratory: negative for cough and wheezing Cardiovascular: negative for chest pain, fatigue and palpitations Gastrointestinal: negative for abdominal pain and change in bowel habits Genitourinary: positive for malodorous vaginal discharge Integument/breast: negative for nipple discharge Musculoskeletal:negative for myalgias Neurological: negative for gait problems and tremors Behavioral/Psych: negative  for abusive relationship, depression Endocrine: negative for temperature intolerance      Blood pressure 115/77, pulse 87, height 5' 3 (1.6 m), weight 231 lb 14.4 oz (105.2 kg), last menstrual period 01/25/2024.  Physical Exam Physical Exam General:   Alert and no distress  Skin:   no rash or abnormalities  Lungs:   clear to auscultation bilaterally  Heart:   regular rate and rhythm, S1, S2 normal, no murmur, click, rub or gallop  Breasts:   normal without  suspicious masses, skin or nipple changes or axillary nodes  Abdomen:  normal findings: no organomegaly, soft, non-tender and no hernia  Pelvis:  External genitalia: normal general appearance Urinary system: urethral meatus normal and bladder without fullness, nontender Vaginal: normal without tenderness, induration or masses Cervix: normal appearance Adnexa: normal bimanual exam Uterus: anteverted and non-tender, normal size    I have spent a total of 20 minutes of face-to-face time, excluding clinical staff time, reviewing notes and preparing to see patient, ordering tests and/or medications, and counseling the patient.   Data Reviewed Wet Prep and cultures  Assessment     1. Vaginal discharge (Primary) Rx: - Cervicovaginal ancillary only( [place])  2. Vaginal irritation  3. Dysmenorrhea  4. BV (bacterial vaginosis), recurrent - 6 month course of Boric Acid vaginal suppositories Rx     Plan   Follow up in 6 months    CARLIN RONAL CENTERS, MD, FACOG Attending Obstetrician & Gynecologist, [facility] for [facility], [facility], [facility] 02/04/2024    [1]  Social History Tobacco Use   Smoking status: Never    Passive exposure: Never   Smokeless tobacco: Never  Vaping Use   Vaping status: Never Used  Substance Use Topics   Alcohol use: Yes    Alcohol/week: 0.0 standard drinks of alcohol    Comment: rare- once every 3 months   Drug use: No  [2]  Allergies Allergen Reactions   Sulfa Antibiotics Shortness Of Breath

## 2024-02-04 NOTE — Progress Notes (Signed)
 Pt reports fishy odor. States she has had recurrent BV in the past.  Pt took flagyl  and vaginal gel tx.  Still some itching.   01/08/24 urgent care; swab came back negative. Symptoms still persist despite medications.

## 2024-02-05 ENCOUNTER — Encounter: Payer: Self-pay | Admitting: Internal Medicine

## 2024-02-05 ENCOUNTER — Ambulatory Visit

## 2024-02-05 ENCOUNTER — Ambulatory Visit: Admitting: Internal Medicine

## 2024-02-05 VITALS — BP 135/87 | HR 92 | Temp 99.2°F | Ht 63.0 in | Wt 228.6 lb

## 2024-02-05 DIAGNOSIS — R0609 Other forms of dyspnea: Secondary | ICD-10-CM

## 2024-02-05 LAB — PULMONARY FUNCTION TEST
DL/VA % pred: 122 %
DL/VA: 5.37 ml/min/mmHg/L
DLCO unc % pred: 99 %
DLCO unc: 20.65 ml/min/mmHg
FEF 25-75 Post: 2.4 L/s
FEF 25-75 Pre: 2.03 L/s
FEF2575-%Change-Post: 17 %
FEF2575-%Pred-Post: 83 %
FEF2575-%Pred-Pre: 71 %
FEV1-%Change-Post: 2 %
FEV1-%Pred-Post: 78 %
FEV1-%Pred-Pre: 76 %
FEV1-Post: 2.19 L
FEV1-Pre: 2.13 L
FEV1FVC-%Change-Post: 2 %
FEV1FVC-%Pred-Pre: 99 %
FEV6-%Change-Post: -1 %
FEV6-%Pred-Post: 76 %
FEV6-%Pred-Pre: 77 %
FEV6-Post: 2.59 L
FEV6-Pre: 2.64 L
FEV6FVC-%Change-Post: 0 %
FEV6FVC-%Pred-Post: 102 %
FEV6FVC-%Pred-Pre: 101 %
FVC-%Change-Post: 0 %
FVC-%Pred-Post: 76 %
FVC-%Pred-Pre: 75 %
FVC-Post: 2.65 L
FVC-Pre: 2.65 L
Post FEV1/FVC ratio: 83 %
Post FEV6/FVC ratio: 100 %
Pre FEV1/FVC ratio: 80 %
Pre FEV6/FVC Ratio: 99 %
RV % pred: 80 %
RV: 1.34 L
TLC % pred: 80 %
TLC: 3.97 L

## 2024-02-05 NOTE — Patient Instructions (Addendum)
 ICD-10-CM   1. DOE (dyspnea on exertion)  R06.09 Cardiopulmonary exercise test      Stil unexplained  Plan  =- do CPST bike test with EIB challenged  Followup  -2-3 months with APP or Dr Geronimo to view CPST

## 2024-02-05 NOTE — Patient Instructions (Signed)
 Full pft performed today

## 2024-02-05 NOTE — Progress Notes (Signed)
 OV 10/15/2023  Subjective:  Patient ID: Amanda Cox, female , DOB: [birth year] , age 48 y.o. , MRN: [id number] , ADDRESS: [address] [place] [address] PCP Amanda Velna SAUNDERS, MD Patient Care Team: Amanda Velna SAUNDERS, MD as PCP - General (Internal Medicine) Amanda Oneil BROCKS, MD as PCP - Cardiology (Cardiology) Amanda Ivanoff, PA-C as Physician Assistant (Chiropractic Medicine) Amanda Ade, MD as Consulting Physician (Physical Medicine and Rehabilitation)  This Provider for this visit: Treatment Team:  Attending Provider: Geronimo Amel, MD    10/15/2023 -   Chief Complaint  Patient presents with   Consult    Sob only with exertion Pt does not monitor o2 at home.      HPI Amanda Cox 48 y.o. -Sister of my patient Amanda Cox. SAw her grandmother Amanda Cox today  . 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  C/o dyspnea on exertion since Jan 2025. In July 2025, had  1 episode of orthopnea  at 5pm. HAving class 3 doe. No wheezing. NO cough.  In past has seen [facility] for palpitations. 2 x ZIO monitor was ok per her hx. She has on-off ankle swelling x some years. She feels this is not due to her obesity which has been stable  No prior E No Cance hx No COPD hx No asthma hx      SIT STAND TEST - goal 15 times   10/15/2023    O2 used ra   PRobe - finter or forehead finger   Number sit and stand completed - goal 15 15   Time taken to complete 36 sec   Resting Pulse Ox/HR/Dyspnea  100% and 79/min and dyspnea of 7/10    Peak measures 96 % and 108/min and dyspnea of 7/10   Final Pulse Ox/HR 100% and 88/min and dyspnea of 6/10   Desaturated </= 88% no   Desaturated <= 3% points no   Got Tachycardic >/= 90/min YES   Miscellaneous comments Very dyspneic      CT Chest data from date: dec 2023  - personally visualized and independently interpreted : YES - my findings are: AS  BELOW arrative & Impression  CLINICAL DATA:  Chest pain   EXAM: CT CHEST WITHOUT CONTRAST   TECHNIQUE: Multidetector CT imaging of the chest was performed following the standard protocol without IV contrast.   RADIATION DOSE REDUCTION: This exam was performed according to the departmental dose-optimization program which includes automated exposure control, adjustment of the mA and/or kV according to patient size and/or use of iterative reconstruction technique.   COMPARISON:  09/24/2004   FINDINGS: Cardiovascular: No significant vascular findings. Normal heart size. No pericardial effusion.   Mediastinum/Nodes: No enlarged mediastinal or axillary lymph nodes. Thyroid  gland, trachea, and esophagus demonstrate no significant findings.   Lungs/Pleura: Lungs are clear. No pleural effusion or pneumothorax.   Upper Abdomen: No acute abnormality.   Musculoskeletal: No chest wall mass or suspicious bone lesions identified.   IMPRESSION: Unremarkable examination of the chest.     Electronically Signed   By: Amanda Cox M.D.   On: 02/04/2022 12:04   OV 02/05/2024  Subjective:  Patient ID: Amanda Cox, female , DOB: [birth year] , age 48 y.o. , MRN: [id number] , ADDRESS: [address] [place] [address] PCP Amanda Velna SAUNDERS, MD Patient Care Team: Amanda Velna SAUNDERS, MD as PCP - General (Internal Medicine) Amanda Oneil BROCKS, MD as PCP -  Cardiology (Cardiology) Amanda Ivanoff, PA-C as Physician Assistant (Chiropractic Medicine) Amanda Ade, MD as Consulting Physician (Physical Medicine and Rehabilitation)  This Provider for this visit: Treatment Team:  Attending Provider: Geronimo Amel, MD    02/05/2024 -   Chief Complaint  Patient presents with   COPD    Pt states since LOV breathing has been about the same SOB w/ exertion and also any activity    Follow-up on explain dyspnea on exertion.  HPI Amanda Cox 48 y.o. -Amanda Cox is a 48 year old female who presents with persistent shortness of breath.  She experiences persistent shortness of breath during activities such as bending down, putting lotion on her feet, and walking from her house to her car. Any type of movement seems to exacerbate the symptoms, although she does not experience chest pain. The shortness of breath has remained the same since her last visit a few months ago.  She denies waking up in the middle of the night due to shortness of breath and uses two pillows under her head, which is her baseline. She is currently taking Torsemide and has recently started weight loss medication, which she has been on for two months.  She is able to climb a flight of stairs without stopping but needs to go slowly to avoid becoming breathless. She can walk on a treadmill for thirty minutes at a regular pace without significant issues and can perform kettlebell exercises for half an hour without problems. However, she sometimes feels winded when talking or climbing stairs.  She has had COVID-19 twice. Previous tests, including a CT scan, were performed.    CT Chest data from date:10/20/23  - personally visualized and independently interpreted : NO - my findings are: AS BELOW  IMPRESSION: 1. Minimal diffuse bilateral bronchial wall thickening, consistent with nonspecific infectious or inflammatory bronchitis. 2. No evidence of fibrotic interstitial lung disease.     Electronically Signed   By: Amanda JONETTA Jaksch M.D.   On: 10/23/2023 06:40  PFT - NORMAL     Latest Ref Rng & Units 02/05/2024    9:44 AM  PFT Results  FVC-Pre L 2.65  P  FVC-Predicted Pre % 75  P  FVC-Post L 2.65  P  FVC-Predicted Post % 76  P  Pre FEV1/FVC % % 80  P  Post FEV1/FCV % % 83  P  FEV1-Pre L 2.13  P  FEV1-Predicted Pre % 76  P  FEV1-Post L 2.19  P  DLCO uncorrected ml/min/mmHg 20.65  P  DLCO UNC% % 99  P  DLVA Predicted % 122  P  TLC L 3.97  P  TLC % Predicted %  80  P  RV % Predicted % 80  P    P Preliminary result   VQ SCAN Narrative & Impression  CLINICAL DATA:  Dyspnea on exertion.  PE suspected.   EXAM: NUCLEAR MEDICINE PERFUSION LUNG SCAN   TECHNIQUE: Perfusion images were obtained in multiple projections after intravenous injection of radiopharmaceutical.   RADIOPHARMACEUTICALS:  0.4 mCi Tc-76m MAA   COMPARISON:  4 radiograph 11/05/2018   FINDINGS: No wedge-shaped peripheral perfusion defect within LEFT or RIGHT lung to suggest acute pulmonary embolism. Normal perfusion pattern.   IMPRESSION: No evidence acute pulmonary embolism.     Electronically Signed   By: Jackquline Boxer M.D.   On: 11/09/2023 16:49       LAB RESULTS last 96 hours No results found.  Latest Reference Range & Units  02/28/18 03:47 06/11/20 19:05 12/08/20 13:38 03/23/21 18:32 02/12/22 09:30 09/14/23 04:40  Eosinophils Absolute 0.0 - 0.5 K/uL 0.1 0.1 0.0 0.1 0.0 0.1    Latest Reference Range & Units 10/29/23 08:16  Class Description Allergens  Comment  D Pteronyssinus IgE Class 0 kU/L <0.10  D Farinae IgE Class 0 kU/L <0.10  Cat Dander IgE Class 0 kU/L <0.10  Dog Dander IgE Class 0 kU/L <0.10  Penicillium Chrysogen IgE Class 0 kU/L <0.10  Cladosporium Herbarum IgE Class 0 kU/L <0.10  Aspergillus Fumigatus IgE Class 0 kU/L <0.10  Mucor Racemosus IgE Class 0 kU/L <0.10  Alternaria Alternata IgE Class 0 kU/L <0.10  Stemphylium Herbarum IgE Class 0 kU/L <0.10  Goose Feathers IgE Class 0 kU/L <0.10  Chicken Feathers IgE Class 0 kU/L <0.10  Duck Feathers IgE Class 0 kU/L <0.10  Mouse Urine IgE Class 0 kU/L <0.10    ECHO SEPT 2025  IMPRESSIONS     1. Left ventricular ejection fraction, by estimation, is 60 to 65%. Left  ventricular ejection fraction by 3D volume is 67 %. The left ventricle has  normal function. The left ventricle has no regional wall motion  abnormalities. Left ventricular diastolic   parameters were normal.   2. Right  ventricular systolic function is normal. The right ventricular  size is normal. There is normal pulmonary artery systolic pressure. The  estimated right ventricular systolic pressure is 28.2 mmHg.   3. Left atrial size was mild to moderately dilated.   4. The mitral valve is normal in structure. No evidence of mitral valve  regurgitation. No evidence of mitral stenosis.   5. The aortic valve is tricuspid. Aortic valve regurgitation is not  visualized. No aortic stenosis is present.   6. The inferior vena cava is dilated in size with >50% respiratory  variability, suggesting right atrial pressure of 8 mmHg.   Comparison(s): No significant change from prior study. Prior images  reviewed side by side.     has a past medical history of Chronic insomnia (11/08/2016), Dyspareunia, Essential hypertension, benign, Genital herpes, unspecified, GERD (gastroesophageal reflux disease), Headache syndrome (11/08/2016), Irregular menstrual cycle, Lumbar spondylosis, Meralgia paraesthetica, right (09/10/2020), Neuralgia and neuritis, unspecified, Obesity (BMI 30-39.9), and Surveillance of previously prescribed contraceptive pill.   reports that she has never smoked. She has never been exposed to tobacco smoke. She has never used smokeless tobacco.  Past Surgical History:  Procedure Laterality Date   BREAST REDUCTION SURGERY  2005   CESAREAN SECTION  2003   REDUCTION MAMMAPLASTY      Allergies[1]  Immunization History  Administered Date(s) Administered   Tdap 04/29/2010    Family History  Problem Relation Age of Onset   Hypertension Mother    Diabetes Mother    Hyperlipidemia Mother    Arthritis Mother    Hypertension Father    Diabetes Father    Arthritis Sister    Diabetes Sister    Arthritis Maternal Grandmother    Arthritis Paternal Grandmother    Hypertension Paternal Grandmother    Congenital heart disease Other    Colon cancer Neg Hx    Rectal cancer Neg Hx    Esophageal cancer  Neg Hx    Stomach cancer Neg Hx    Liver cancer Neg Hx     Current Medications[2]      Objective:   Vitals:   02/05/24 1409  BP: 135/87  Pulse: 92  Temp: 99.2 F (37.3 C)  TempSrc: Oral  SpO2: 99%  Weight: 228 lb 9.6 oz (103.7 kg)  Height: 5' 3 (1.6 m)    Estimated body mass index is 40.49 kg/m as calculated from the following:   Height as of this encounter: 5' 3 (1.6 m).   Weight as of this encounter: 228 lb 9.6 oz (103.7 kg).  @WEIGHTCHANGE @  [employer]   02/05/24 1409  Weight: 228 lb 9.6 oz (103.7 kg)     Physical Exam   General: No distress. LOOKS WELL O2 at rest: NO Cane present: NO Sitting in wheel chair: NO Frail: NO Obese: YES Neuro: Alert and Oriented x 3. GCS 15. Speech normal Psych: Pleasant Resp:  Barrel Chest - NO.  Wheeze - NO, Crackles - NO, No overt respiratory distress CVS: Normal heart sounds. Murmurs - NO Ext: Stigmata of Connective Tissue Disease - NO8 HEENT: Normal upper airway. PEERL +. No post nasal drip        Assessment/     Assessment & Plan DOE (dyspnea on exertion)    PLAN Patient Instructions     ICD-10-CM   1. DOE (dyspnea on exertion)  R06.09 Cardiopulmonary exercise test      Stil unexplained  Plan  =- do CPST bike test with EIB challenged  Followup  -2-3 months with APP or Dr Amanda to view CPST    FOLLOWUP    Return for  -2-3 months with APP or Dr Amanda to view CPST.    SIGNATURE    Dr. Dorethia Amanda, M.D., F.C.C.P,  Pulmonary and Critical Care Medicine Staff Physician, [facility] Director - Interstitial Lung Disease  Program  Pulmonary Fibrosis [facility] at [facility] [place], [place], [address]  Pager: [phone], If no answer or between  15:00h - 7:00h: call [phone] Telephone: [phone]  5:48 PM 02/05/2024    [1]  Allergies Allergen Reactions   Sulfa Antibiotics Shortness Of Breath  [2]  Current Outpatient  Medications:    famotidine  (PEPCID ) 20 MG tablet, Take 1 tablet (20 mg total) by mouth 2 (two) times daily. (Patient taking differently: Take 20 mg by mouth 2 (two) times daily. AS NEEDED), Disp: 60 tablet, Rfl: 2   fluticasone  (FLONASE ) 50 MCG/ACT nasal spray, Place 1 spray into both nostrils daily. (Patient taking differently: Place 1 spray into both nostrils daily. AS NEEDED), Disp: 9.9 mL, Rfl: 2   ibuprofen  (ADVIL ) 800 MG tablet, Take 1 tablet (800 mg total) by mouth every 8 (eight) hours as needed., Disp: 30 tablet, Rfl: 5   metroNIDAZOLE  (FLAGYL ) 500 MG tablet, Take 1 tablet (500 mg total) by mouth 2 (two) times daily. (Patient taking differently: Take 500 mg by mouth 2 (two) times daily. AS NEEDED), Disp: 14 tablet, Rfl: 0   Multiple Vitamin (MULTIVITAMIN) tablet, Take 1 tablet by mouth daily., Disp: , Rfl:    Norgestim-Eth Estrad Triphasic (NORGESTIMATE -ETHINYL ESTRADIOL  TRIPHASIC) 0.18/0.215/0.25 MG-25 MCG tab, Take 1 tablet by mouth daily., Disp: 84 tablet, Rfl: 11   olmesartan (BENICAR) 40 MG tablet, Take 40 mg by mouth daily., Disp: , Rfl:    tirzepatide (ZEPBOUND) 5 MG/0.5ML Pen, Inject 5 mg into the skin once a week., Disp: , Rfl:    torsemide (DEMADEX) 20 MG tablet, Take by mouth., Disp: , Rfl:    valACYclovir  (VALTREX ) 1000 MG tablet, Take 0.5 tablets (500 mg total) by mouth daily. (Patient taking differently: Take 500 mg by mouth daily. AS NEEDED), Disp: 30 tablet, Rfl: 2   albuterol  (  PROVENTIL ) (2.5 MG/3ML) 0.083% nebulizer solution, Take 3 mLs (2.5 mg total) by nebulization every 6 (six) hours as needed. (Patient not taking: Reported on 02/05/2024), Disp: 75 mL, Rfl: 2   HYDROcodone -acetaminophen  (NORCO/VICODIN) 5-325 MG tablet, Take 1-2 tablets by mouth every 6 (six) hours as needed. (Patient not taking: Reported on 02/05/2024), Disp: 10 tablet, Rfl: 0   Misc. Devices KIT, Nebulizer machine with tubing and mask. Reactive airway ICD cod J68.3 (Patient not taking: Reported on  02/05/2024), Disp: 1 kit, Rfl: 0

## 2024-02-05 NOTE — Progress Notes (Signed)
 Full pft performed today

## 2024-02-06 LAB — CERVICOVAGINAL ANCILLARY ONLY
Bacterial Vaginitis (gardnerella): NEGATIVE
Candida Glabrata: NEGATIVE
Candida Vaginitis: NEGATIVE
Chlamydia: NEGATIVE
Comment: NEGATIVE
Comment: NEGATIVE
Comment: NEGATIVE
Comment: NEGATIVE
Comment: NEGATIVE
Comment: NORMAL
Neisseria Gonorrhea: NEGATIVE
Trichomonas: NEGATIVE

## 2024-02-08 ENCOUNTER — Ambulatory Visit: Admitting: Internal Medicine

## 2024-02-26 ENCOUNTER — Telehealth: Payer: Self-pay | Admitting: Internal Medicine

## 2024-02-26 NOTE — Telephone Encounter (Signed)
 Copied from CRM [id number]. Topic: Appointments - Scheduling Inquiry for Clinic >> Feb 26, 2024 12:46 PM [name] wrote: Reason for CRM: Patient calling to inform Dr. Geronimo that no one has contacted her yet regarding scheduling her cardiopulmonary exercise test. Patient inquiring on if there is a phone number she could call.

## 2024-02-29 NOTE — Telephone Encounter (Signed)
Routing to P[facility] to advise

## 2024-03-06 ENCOUNTER — Emergency Department (HOSPITAL_COMMUNITY)

## 2024-03-06 ENCOUNTER — Other Ambulatory Visit: Payer: Self-pay

## 2024-03-06 ENCOUNTER — Encounter (HOSPITAL_COMMUNITY): Payer: Self-pay

## 2024-03-06 ENCOUNTER — Emergency Department (HOSPITAL_COMMUNITY)
Admission: EM | Admit: 2024-03-06 | Discharge: 2024-03-06 | Disposition: A | Discharge status: Home / Self Care | Attending: Emergency Medicine | Admitting: Emergency Medicine

## 2024-03-06 DIAGNOSIS — R202 Paresthesia of skin: Secondary | ICD-10-CM | POA: Insufficient documentation

## 2024-03-06 DIAGNOSIS — R079 Chest pain, unspecified: Secondary | ICD-10-CM | POA: Insufficient documentation

## 2024-03-06 LAB — CBC WITH DIFFERENTIAL/PLATELET
Abs Immature Granulocytes: 0.02 K/uL (ref 0.00–0.07)
Basophils Absolute: 0.1 K/uL (ref 0.0–0.1)
Basophils Relative: 1 %
Eosinophils Absolute: 0.1 K/uL (ref 0.0–0.5)
Eosinophils Relative: 1 %
HCT: 39 % (ref 36.0–46.0)
Hemoglobin: 12.5 g/dL (ref 12.0–15.0)
Immature Granulocytes: 0 %
Lymphocytes Relative: 25 %
Lymphs Abs: 2.2 K/uL (ref 0.7–4.0)
MCH: 28.3 pg (ref 26.0–34.0)
MCHC: 32.1 g/dL (ref 30.0–36.0)
MCV: 88.4 fL (ref 80.0–100.0)
Monocytes Absolute: 0.8 K/uL (ref 0.1–1.0)
Monocytes Relative: 9 %
Neutro Abs: 5.8 K/uL (ref 1.7–7.7)
Neutrophils Relative %: 64 %
Platelets: 365 K/uL (ref 150–400)
RBC: 4.41 MIL/uL (ref 3.87–5.11)
RDW: 13.2 % (ref 11.5–15.5)
WBC: 9 K/uL (ref 4.0–10.5)
nRBC: 0 % (ref 0.0–0.2)

## 2024-03-06 LAB — COMPREHENSIVE METABOLIC PANEL WITH GFR
ALT: 12 U/L (ref 0–44)
AST: 22 U/L (ref 15–41)
Albumin: 4.2 g/dL (ref 3.5–5.0)
Alkaline Phosphatase: 53 U/L (ref 38–126)
Anion gap: 10 (ref 5–15)
BUN: 13 mg/dL (ref 6–20)
CO2: 27 mmol/L (ref 22–32)
Calcium: 9.2 mg/dL (ref 8.9–10.3)
Chloride: 102 mmol/L (ref 98–111)
Creatinine, Ser: 0.8 mg/dL (ref 0.44–1.00)
GFR, Estimated: >60 mL/min
Glucose, Bld: 88 mg/dL (ref 70–99)
Potassium: 4.2 mmol/L (ref 3.5–5.1)
Sodium: 139 mmol/L (ref 135–145)
Total Bilirubin: 0.5 mg/dL (ref 0.0–1.2)
Total Protein: 7.3 g/dL (ref 6.5–8.1)

## 2024-03-06 LAB — TROPONIN T, HIGH SENSITIVITY
Troponin T High Sensitivity: <15 ng/L (ref 0–19)
Troponin T High Sensitivity: <15 ng/L (ref 0–19)

## 2024-03-06 LAB — MAGNESIUM: Magnesium: 1.9 mg/dL (ref 1.7–2.4)

## 2024-03-06 NOTE — Discharge Instructions (Signed)
 I think most likely you have strained a muscle in your left chest wall.  As this is been going on for weeks I think you might benefit from physical therapy.  Please discuss this with your family doctor.  Take 4 over the counter ibuprofen  tablets 3 times a day or 2 over-the-counter naproxen  tablets twice a day for pain. Also take tylenol  1000mg (2 extra strength) four times a day.

## 2024-03-06 NOTE — ED Provider Notes (Signed)
 " [place] EMERGENCY DEPARTMENT AT [facility] Provider Note   CSN: [id number] Arrival date & time: 03/06/24  1623     Patient presents with: Tingling   Amanda Cox is a 49 y.o. female.   49 yo F with a chief complaint of left-sided chest discomfort.  This has been going on for about 3 weeks.  Sometimes feels like she has tingling down the left arm.  Denies trauma to the area denies cough congestion or fever.        Prior to Admission medications  Medication Sig Start Date End Date Taking? Authorizing Provider  albuterol  (PROVENTIL ) (2.5 MG/3ML) 0.083% nebulizer solution Take 3 mLs (2.5 mg total) by nebulization every 6 (six) hours as needed. Patient not taking: Reported on 02/05/2024 02/17/22   Rising, Asberry, PA-C  famotidine  (PEPCID ) 20 MG tablet Take 1 tablet (20 mg total) by mouth 2 (two) times daily. Patient taking differently: Take 20 mg by mouth 2 (two) times daily. AS NEEDED 11/05/23   Rising, Asberry, PA-C  fluticasone  (FLONASE ) 50 MCG/ACT nasal spray Place 1 spray into both nostrils daily. Patient taking differently: Place 1 spray into both nostrils daily. AS NEEDED 02/04/23   Ball, [place]  G, FNP  HYDROcodone -acetaminophen  (NORCO/VICODIN) 5-325 MG tablet Take 1-2 tablets by mouth every 6 (six) hours as needed. Patient not taking: Reported on 02/05/2024 11/06/23   Geroldine Berg, MD  ibuprofen  (ADVIL ) 800 MG tablet Take 1 tablet (800 mg total) by mouth every 8 (eight) hours as needed. 07/11/23   Rudy Carlin LABOR, MD  metroNIDAZOLE  (FLAGYL ) 500 MG tablet Take 1 tablet (500 mg total) by mouth 2 (two) times daily. Patient taking differently: Take 500 mg by mouth 2 (two) times daily. AS NEEDED 01/08/24   Flint Sonny POUR, PA-C  Misc. Devices KIT Nebulizer machine with tubing and mask. Reactive airway ICD cod J68.3 Patient not taking: Reported on 02/05/2024 02/17/22   Rising, Asberry, PA-C  Multiple Vitamin (MULTIVITAMIN) tablet Take 1 tablet by mouth  daily.    [provider]  Jaquita Kale Triphasic (NORGESTIMATE -ETHINYL ESTRADIOL  TRIPHASIC) 0.18/0.215/0.25 MG-25 MCG tab Take 1 tablet by mouth daily. 07/12/23   Rudy Carlin LABOR, MD  olmesartan (BENICAR) 40 MG tablet Take 40 mg by mouth daily. 01/23/22   [provider]  tirzepatide (ZEPBOUND) 5 MG/0.5ML Pen Inject 5 mg into the skin once a week.    [provider]  torsemide (DEMADEX) 20 MG tablet Take by mouth. 01/23/22   [provider]  valACYclovir  (VALTREX ) 1000 MG tablet Take 0.5 tablets (500 mg total) by mouth daily. Patient taking differently: Take 500 mg by mouth daily. AS NEEDED 01/23/24   Constant, Winton, MD    Allergies: Sulfa antibiotics    Review of Systems  Updated Vital Signs BP 116/73   Pulse 96   Temp 98.3 F (36.8 C) (Oral)   Resp 19   Ht 5' 3 (1.6 m)   Wt 102.1 kg   SpO2 98%   BMI 39.86 kg/m   Physical Exam Vitals and nursing note reviewed.  Constitutional:      General: She is not in acute distress.    Appearance: She is well-developed. She is not diaphoretic.  HENT:     Head: Normocephalic and atraumatic.  Eyes:     Pupils: Pupils are equal, round, and reactive to light.  Cardiovascular:     Rate and Rhythm: Normal rate and regular rhythm.     Heart sounds: No murmur heard.  No friction rub. No gallop.  Pulmonary:     Effort: Pulmonary effort is normal.     Breath sounds: No wheezing or rales.  Abdominal:     General: There is no distension.     Palpations: Abdomen is soft.     Tenderness: There is no abdominal tenderness.  Musculoskeletal:        General: No tenderness.     Cervical back: Normal range of motion and neck supple.     Comments: Tenderness over the musculature of the left anterior chest.  Reproduces her discomfort.  Pulse motor and sensation are intact distally.  Skin:    General: Skin is warm and dry.  Neurological:     Mental Status: She is alert and oriented to person, place, and  time.  Psychiatric:        Behavior: Behavior normal.     (all labs ordered are listed, but only abnormal results are displayed) Labs Reviewed  COMPREHENSIVE METABOLIC PANEL WITH GFR  CBC WITH DIFFERENTIAL/PLATELET  MAGNESIUM  TROPONIN T, HIGH SENSITIVITY  TROPONIN T, HIGH SENSITIVITY    EKG: EKG Interpretation Date/Time:  Thursday March 06 2024 17:15:11 EST Ventricular Rate:  71 PR Interval:  150 QRS Duration:  82 QT Interval:  378 QTC Calculation: 410 R Axis:   69  Text Interpretation: Normal sinus rhythm Normal ECG Since last tracing rate slower Otherwise no significant change Confirmed by Emil Share [phone]) on 03/06/2024 11:40:12 PM  Radiology: [facility] Chest 2 View Result Date: 03/06/2024 EXAM: 2 VIEW(S) XRAY OF THE CHEST 03/06/2024 05:45:00 PM COMPARISON: 11/05/2023 CLINICAL HISTORY: cp cp cp cp cp FINDINGS: LUNGS AND PLEURA: No focal pulmonary opacity. No pleural effusion. No pneumothorax. HEART AND MEDIASTINUM: No acute abnormality of the cardiac and mediastinal silhouettes. BONES AND SOFT TISSUES: No acute fracture or destructive lesion. Multilevel thoracic osteophytosis. IMPRESSION: 1. No acute cardiopulmonary abnormality. 2. Multilevel thoracic osteophytosis. Electronically signed by: Dorethia Molt MD 03/06/2024 05:49 PM EST RP Workstation: [id number]   CT Cervical Spine Wo Contrast Result Date: 03/06/2024 CLINICAL DATA:  Cervical radiculopathy EXAM: CT CERVICAL SPINE WITHOUT CONTRAST TECHNIQUE: Multidetector CT imaging of the cervical spine was performed without intravenous contrast. Multiplanar CT image reconstructions were also generated. RADIATION DOSE REDUCTION: This exam was performed according to the departmental dose-optimization program which includes automated exposure control, adjustment of the mA and/or kV according to patient size and/or use of iterative reconstruction technique. COMPARISON:  None Available. FINDINGS: Alignment: Mild reversal of cervical lordosis.  No subluxation. Facet alignment is normal Skull base and vertebrae: No acute fracture. No primary bone lesion or focal pathologic process. Soft tissues and spinal canal: No prevertebral fluid or swelling. No visible canal hematoma. Disc levels: Mild disc space narrowing C5-C6. No high-grade bony foraminal narrowing. Upper chest: Lung apices are clear. Other: None IMPRESSION: Mild reversal of cervical lordosis. No acute osseous abnormality. Mild degenerative changes at C5-C6. Electronically Signed   By: Luke Bun M.D.   On: 03/06/2024 17:29     Procedures   Medications Ordered in the ED - No data to display                                  Medical Decision Making  49 yo F with a chief complaint of left-sided chest discomfort.  Atypical in nature reproduced on exam going on for 3 weeks now.  2 troponins are negative.  Chest x-ray on my independent  interpretation without focal infiltrate or pneumothorax.  No acute anemia no significant electrolyte abnormalities  11:40 PM:  I have discussed the diagnosis/risks/treatment options with the patient.  Evaluation and diagnostic testing in the emergency department does not suggest an emergent condition requiring admission or immediate intervention beyond what has been performed at this time.  They will follow up with PCP. We also discussed returning to the ED immediately if new or worsening sx occur. We discussed the sx which are most concerning (e.g., sudden worsening pain, fever, inability to tolerate by mouth) that necessitate immediate return. Medications administered to the patient during their visit and any new prescriptions provided to the patient are listed below.  Medications given during this visit Medications - No data to display   The patient appears reasonably screen and/or stabilized for discharge and I doubt any other medical condition or other [facility] requiring further screening, evaluation, or treatment in the ED at this time prior to  discharge.       Final diagnoses:  Nonspecific chest pain  Arm paresthesia, left    ED Discharge Orders     None          Emil Share, DO 03/06/24 2340  "

## 2024-03-06 NOTE — ED Triage Notes (Signed)
 Pt states left arm has been tingling intermittently for 3-4 weeks. Denies CP and SHOB/blurred vision/headaches. Axox4. Pt ambulatory.

## 2024-03-06 NOTE — ED Triage Notes (Signed)
 Patient reports intermittent tingling and shooting pains down L arm with some tenderness to palpation of the L pectoral muscle. Patient reports repetitive motion at desk for work. No unilateral deficits at this time, no weakness or sensation loss when pain occurs. Patient reports no dizziness.

## 2024-03-06 NOTE — ED Provider Triage Note (Signed)
 Emergency Medicine Provider Triage Evaluation Note  Amanda Cox , a 49 y.o. female  was evaluated in triage.  Pt complains of increasing left chest wall pain and left arm pain for the past 3 to 4 weeks.  Denies fall or injury.  States pain is constant but will occasionally get worse.  Also notes tingling sensation in the left arm.  Denies headache or neck pain.  No previous heart history.  Review of Systems  Positive: Chest wall pain, arthralgias Negative: Headache, dizziness, syncope, abdominal pain, nausea/vomiting, shortness of breath  Physical Exam  BP 131/81 (BP Location: Right Arm)   Pulse 96   Temp 98.1 F (36.7 C) (Oral)   Resp 16   Ht 5' 3 (1.6 m)   Wt 102.1 kg   SpO2 98%   BMI 39.86 kg/m  Gen:   Awake, no distress   Resp:  Normal effort  MSK:   Moves extremities without difficulty  Other:  No acute neurologic deficits  Medical Decision Making  Medically screening exam initiated at 4:55 PM.  Appropriate orders placed.  Amanda Cox was informed that the remainder of the evaluation will be completed by another provider, this initial triage assessment does not replace that evaluation, and the importance of remaining in the ED until their evaluation is complete.     Amanda Cox, [place] 03/06/24 1656

## 2024-04-07 ENCOUNTER — Encounter (HOSPITAL_COMMUNITY)

## 2024-05-06 ENCOUNTER — Ambulatory Visit: Admitting: Internal Medicine
